# Patient Record
Sex: Female | Born: 1940
Health system: Southern US, Community
[De-identification: ages and names within clinical notes are randomized; demographics above are authoritative.]

## PROBLEM LIST (undated history)

## (undated) ENCOUNTER — Emergency Department (HOSPITAL_COMMUNITY)

## (undated) DIAGNOSIS — C50919 Malignant neoplasm of unspecified site of unspecified female breast: Secondary | ICD-10-CM

## (undated) DIAGNOSIS — D696 Thrombocytopenia, unspecified: Secondary | ICD-10-CM

## (undated) DIAGNOSIS — N183 Chronic kidney disease, stage 3 unspecified: Secondary | ICD-10-CM

## (undated) DIAGNOSIS — J449 Chronic obstructive pulmonary disease, unspecified: Secondary | ICD-10-CM

## (undated) DIAGNOSIS — E785 Hyperlipidemia, unspecified: Secondary | ICD-10-CM

## (undated) DIAGNOSIS — R079 Chest pain, unspecified: Secondary | ICD-10-CM

## (undated) DIAGNOSIS — N189 Chronic kidney disease, unspecified: Secondary | ICD-10-CM

## (undated) DIAGNOSIS — K7460 Unspecified cirrhosis of liver: Secondary | ICD-10-CM

## (undated) DIAGNOSIS — C801 Malignant (primary) neoplasm, unspecified: Secondary | ICD-10-CM

## (undated) DIAGNOSIS — R0601 Orthopnea: Secondary | ICD-10-CM

## (undated) DIAGNOSIS — I350 Nonrheumatic aortic (valve) stenosis: Secondary | ICD-10-CM

## (undated) DIAGNOSIS — R0602 Shortness of breath: Secondary | ICD-10-CM

## (undated) DIAGNOSIS — Z923 Personal history of irradiation: Secondary | ICD-10-CM

## (undated) DIAGNOSIS — K219 Gastro-esophageal reflux disease without esophagitis: Secondary | ICD-10-CM

## (undated) DIAGNOSIS — I10 Essential (primary) hypertension: Secondary | ICD-10-CM

## (undated) DIAGNOSIS — R600 Localized edema: Secondary | ICD-10-CM

## (undated) DIAGNOSIS — E559 Vitamin D deficiency, unspecified: Secondary | ICD-10-CM

## (undated) DIAGNOSIS — M1990 Unspecified osteoarthritis, unspecified site: Secondary | ICD-10-CM

## (undated) DIAGNOSIS — R002 Palpitations: Secondary | ICD-10-CM

## (undated) HISTORY — DX: Gastro-esophageal reflux disease without esophagitis: K21.9

## (undated) HISTORY — DX: Essential (primary) hypertension: I10

## (undated) HISTORY — PX: BREAST BIOPSY: SHX20

## (undated) HISTORY — DX: Chronic obstructive pulmonary disease, unspecified: J44.9

## (undated) HISTORY — DX: Unspecified cirrhosis of liver: K74.60

## (undated) HISTORY — DX: Nonrheumatic aortic (valve) stenosis: I35.0

## (undated) HISTORY — DX: Vitamin D deficiency, unspecified: E55.9

## (undated) HISTORY — PX: CORONARY ANGIOPLASTY: SHX604

## (undated) HISTORY — DX: Shortness of breath: R06.02

## (undated) HISTORY — DX: Unspecified osteoarthritis, unspecified site: M19.90

## (undated) HISTORY — DX: Orthopnea: R06.01

## (undated) HISTORY — DX: Chest pain, unspecified: R07.9

## (undated) HISTORY — DX: Palpitations: R00.2

## (undated) HISTORY — DX: Hyperlipidemia, unspecified: E78.5

## (undated) HISTORY — PX: BREAST LUMPECTOMY: SHX2

## (undated) HISTORY — DX: Malignant (primary) neoplasm, unspecified: C80.1

## (undated) HISTORY — DX: Chronic kidney disease, unspecified: N18.9

## (undated) HISTORY — PX: CARDIAC CATHETERIZATION: SHX172

## (undated) HISTORY — PX: LAPAROSCOPIC TOTAL HYSTERECTOMY: SUR800

## (undated) HISTORY — DX: Localized edema: R60.0

---

## 2000-10-31 ENCOUNTER — Other Ambulatory Visit: Admission: RE | Admit: 2000-10-31 | Discharge: 2000-10-31 | Payer: Self-pay | Admitting: Obstetrics and Gynecology

## 2002-03-12 ENCOUNTER — Encounter: Admission: RE | Admit: 2002-03-12 | Discharge: 2002-03-12 | Payer: Self-pay | Admitting: Family Medicine

## 2002-03-12 ENCOUNTER — Encounter: Payer: Self-pay | Admitting: Family Medicine

## 2004-06-18 ENCOUNTER — Encounter (INDEPENDENT_AMBULATORY_CARE_PROVIDER_SITE_OTHER): Payer: Self-pay | Admitting: *Deleted

## 2004-06-18 ENCOUNTER — Encounter (INDEPENDENT_AMBULATORY_CARE_PROVIDER_SITE_OTHER): Payer: Self-pay | Admitting: Specialist

## 2004-06-18 ENCOUNTER — Encounter (INDEPENDENT_AMBULATORY_CARE_PROVIDER_SITE_OTHER): Payer: Self-pay | Admitting: Radiology

## 2004-06-18 ENCOUNTER — Encounter: Admission: RE | Admit: 2004-06-18 | Discharge: 2004-06-18 | Payer: Self-pay | Admitting: Family Medicine

## 2004-06-29 ENCOUNTER — Encounter (HOSPITAL_COMMUNITY): Admission: RE | Admit: 2004-06-29 | Discharge: 2004-09-27 | Payer: Self-pay | Admitting: General Surgery

## 2004-07-06 ENCOUNTER — Encounter: Admission: RE | Admit: 2004-07-06 | Discharge: 2004-07-06 | Payer: Self-pay | Admitting: General Surgery

## 2004-07-07 ENCOUNTER — Encounter (INDEPENDENT_AMBULATORY_CARE_PROVIDER_SITE_OTHER): Payer: Self-pay | Admitting: General Surgery

## 2004-07-07 ENCOUNTER — Encounter: Admission: RE | Admit: 2004-07-07 | Discharge: 2004-07-07 | Payer: Self-pay | Admitting: General Surgery

## 2004-07-07 ENCOUNTER — Ambulatory Visit (HOSPITAL_BASED_OUTPATIENT_CLINIC_OR_DEPARTMENT_OTHER): Admission: RE | Admit: 2004-07-07 | Discharge: 2004-07-07 | Payer: Self-pay | Admitting: General Surgery

## 2004-07-07 ENCOUNTER — Encounter (INDEPENDENT_AMBULATORY_CARE_PROVIDER_SITE_OTHER): Payer: Self-pay | Admitting: *Deleted

## 2004-07-07 ENCOUNTER — Ambulatory Visit (HOSPITAL_COMMUNITY): Admission: RE | Admit: 2004-07-07 | Discharge: 2004-07-07 | Payer: Self-pay | Admitting: General Surgery

## 2004-07-08 ENCOUNTER — Ambulatory Visit: Payer: Self-pay | Admitting: Oncology

## 2004-07-23 ENCOUNTER — Ambulatory Visit (HOSPITAL_COMMUNITY): Admission: RE | Admit: 2004-07-23 | Discharge: 2004-07-23 | Payer: Self-pay | Admitting: Oncology

## 2004-07-24 ENCOUNTER — Encounter: Payer: Self-pay | Admitting: Cardiology

## 2004-07-24 ENCOUNTER — Ambulatory Visit: Admission: RE | Admit: 2004-07-24 | Discharge: 2004-07-24 | Payer: Self-pay | Admitting: Oncology

## 2004-08-05 ENCOUNTER — Ambulatory Visit (HOSPITAL_COMMUNITY): Admission: RE | Admit: 2004-08-05 | Discharge: 2004-08-05 | Payer: Self-pay | Admitting: Oncology

## 2004-08-13 ENCOUNTER — Ambulatory Visit (HOSPITAL_BASED_OUTPATIENT_CLINIC_OR_DEPARTMENT_OTHER): Admission: RE | Admit: 2004-08-13 | Discharge: 2004-08-13 | Payer: Self-pay | Admitting: General Surgery

## 2004-08-13 ENCOUNTER — Ambulatory Visit (HOSPITAL_COMMUNITY): Admission: RE | Admit: 2004-08-13 | Discharge: 2004-08-13 | Payer: Self-pay | Admitting: General Surgery

## 2004-08-28 ENCOUNTER — Ambulatory Visit: Payer: Self-pay | Admitting: Oncology

## 2004-10-15 ENCOUNTER — Ambulatory Visit: Payer: Self-pay | Admitting: Oncology

## 2004-11-20 ENCOUNTER — Emergency Department (HOSPITAL_COMMUNITY): Admission: EM | Admit: 2004-11-20 | Discharge: 2004-11-20 | Payer: Self-pay | Admitting: *Deleted

## 2004-12-03 ENCOUNTER — Ambulatory Visit: Payer: Self-pay | Admitting: Oncology

## 2005-01-12 ENCOUNTER — Ambulatory Visit: Admission: RE | Admit: 2005-01-12 | Discharge: 2005-03-25 | Payer: Self-pay | Admitting: Radiation Oncology

## 2005-01-15 ENCOUNTER — Encounter: Admission: RE | Admit: 2005-01-15 | Discharge: 2005-01-15 | Payer: Self-pay | Admitting: Radiation Oncology

## 2005-02-11 ENCOUNTER — Ambulatory Visit: Payer: Self-pay | Admitting: Oncology

## 2005-03-24 ENCOUNTER — Encounter: Admission: RE | Admit: 2005-03-24 | Discharge: 2005-03-24 | Payer: Self-pay | Admitting: Oncology

## 2005-04-05 ENCOUNTER — Ambulatory Visit (HOSPITAL_BASED_OUTPATIENT_CLINIC_OR_DEPARTMENT_OTHER): Admission: RE | Admit: 2005-04-05 | Discharge: 2005-04-05 | Payer: Self-pay | Admitting: General Surgery

## 2005-05-12 ENCOUNTER — Ambulatory Visit: Payer: Self-pay | Admitting: Oncology

## 2005-05-18 ENCOUNTER — Ambulatory Visit (HOSPITAL_COMMUNITY): Admission: RE | Admit: 2005-05-18 | Discharge: 2005-05-18 | Payer: Self-pay | Admitting: Oncology

## 2005-07-06 ENCOUNTER — Encounter: Admission: RE | Admit: 2005-07-06 | Discharge: 2005-07-06 | Payer: Self-pay | Admitting: Oncology

## 2005-07-30 ENCOUNTER — Ambulatory Visit: Payer: Self-pay | Admitting: Oncology

## 2005-08-13 ENCOUNTER — Ambulatory Visit (HOSPITAL_COMMUNITY): Admission: RE | Admit: 2005-08-13 | Discharge: 2005-08-13 | Payer: Self-pay | Admitting: Oncology

## 2005-10-07 ENCOUNTER — Ambulatory Visit: Payer: Self-pay | Admitting: Oncology

## 2005-11-19 ENCOUNTER — Ambulatory Visit (HOSPITAL_COMMUNITY): Admission: RE | Admit: 2005-11-19 | Discharge: 2005-11-19 | Payer: Self-pay | Admitting: Gastroenterology

## 2005-12-03 ENCOUNTER — Ambulatory Visit: Payer: Self-pay | Admitting: Oncology

## 2006-04-12 IMAGING — PT NM PET TUM IMG SKULL BASE T - THIGH
3 series · 25 of 25 positions shown · non-contrast
Comparison: None.

CLINICAL DATA: The patient has a history of breast cancer with lumpectomy on 07/07/04 with 2 positive axillary nodes. 
 FDG PET-CT TUMOR IMAGING (SKULL BASE TO THIGHS):
TECHNIQUE: 17.4 mCi F-18 FDG were administered via the right antecubital fossa.  Full-ring PET imaging was performed from the skull base through the mid-thighs 60 minutes after injection.  CT data was obtained and used for attenuation correction and anatomic localization only.  (This was not acquired as a diagnostic CT examination.)

[Series 1: pet ac · axial · 3.3mm · 4.69mm/px · z∈[-870,+0]mm · 9 of 267 slices shown]
[im 1/267]
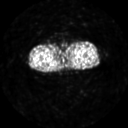
[im 34/267]
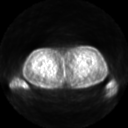
[im 67/267]
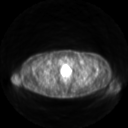
[im 100/267]
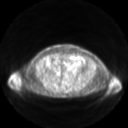
[im 134/267]
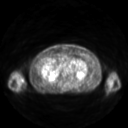
[im 167/267]
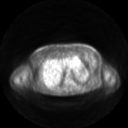
[im 200/267]
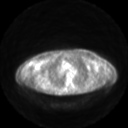
[im 233/267]
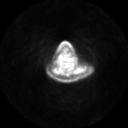
[im 267/267]
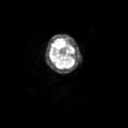

[Series 2: ct images · axial · 3.8mm · 0.98mm/px · z∈[-870,+0]mm · 8 of 267 slices shown]
[im 1/267]
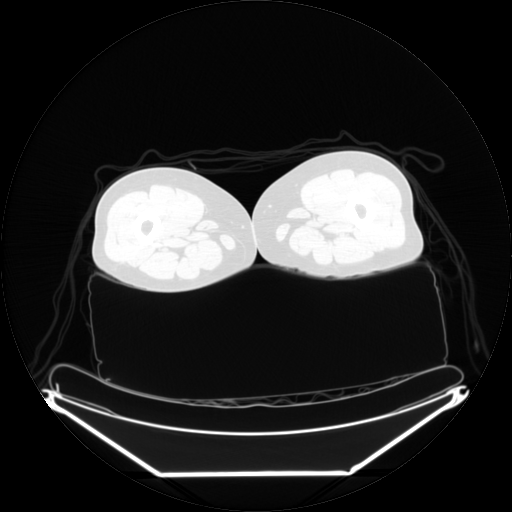
[im 39/267]
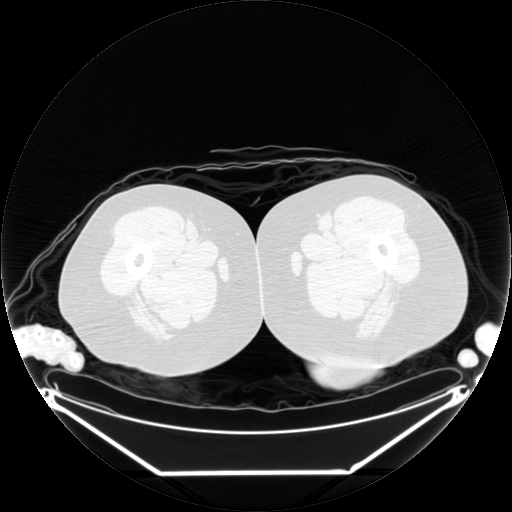
[im 77/267]
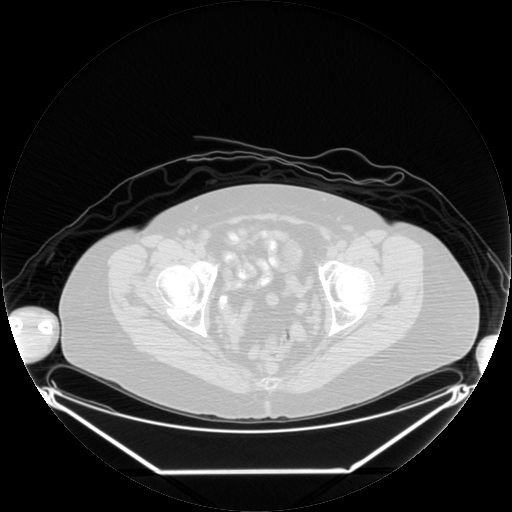
[im 115/267]
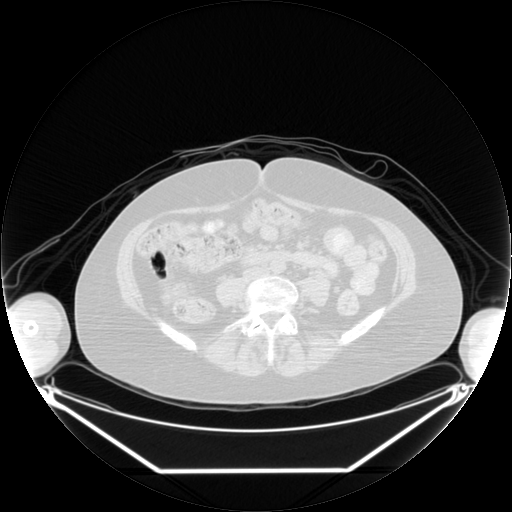
[im 153/267]
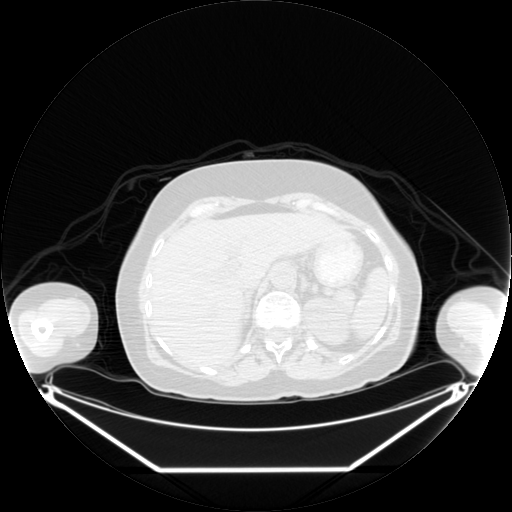
[im 191/267]
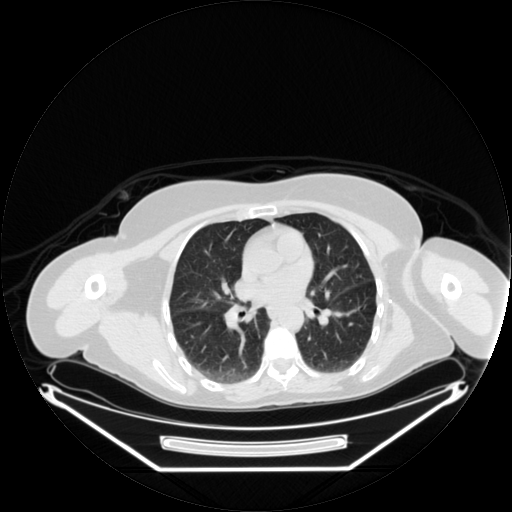
[im 229/267]
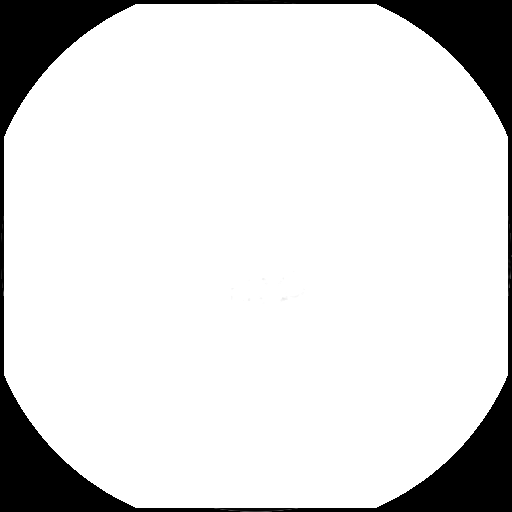
[im 267/267  brain]
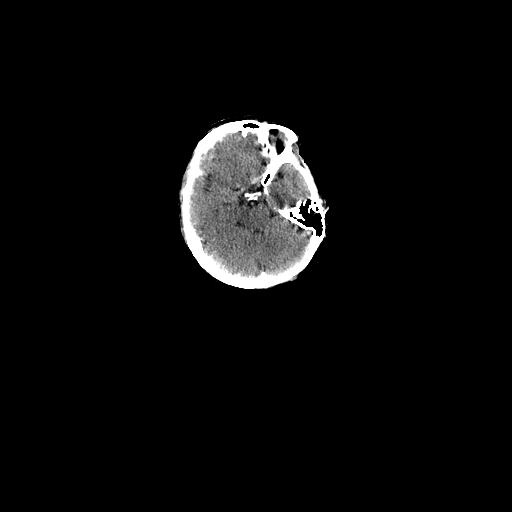

[Series 2: pet nac · axial · 3.3mm · 4.69mm/px · z∈[-870,+0]mm · 8 of 267 slices shown]
[im 1/267]
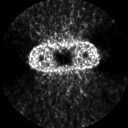
[im 39/267]
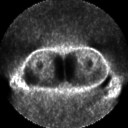
[im 77/267]
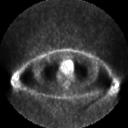
[im 115/267]
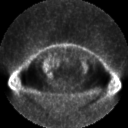
[im 153/267]
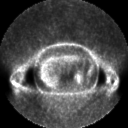
[im 191/267]
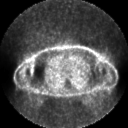
[im 229/267]
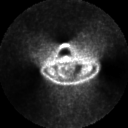
[im 267/267]
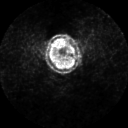

[25 of 25 positions shown; findings below may reference images not displayed]

FINDINGS: No abnormal activity is present in the neck or chest.  There is minimal increase in activity in the left breast in the area of the previous lumpectomy.  No axillary node activity is identified.  Metallic clips are noted in the region of the left axilla in the chest on the CT scan.
IMPRESSION: No areas of abnormal FDG activity.  Several areas of extensive calcification noted in the left lung are thought to represent granulomas.

## 2006-05-03 ENCOUNTER — Ambulatory Visit: Payer: Self-pay | Admitting: Oncology

## 2006-05-05 LAB — CBC WITH DIFFERENTIAL/PLATELET
BASO%: 0.5 % (ref 0.0–2.0)
Basophils Absolute: 0 10*3/uL (ref 0.0–0.1)
EOS%: 2.6 % (ref 0.0–7.0)
Eosinophils Absolute: 0.1 10*3/uL (ref 0.0–0.5)
HCT: 40 % (ref 34.8–46.6)
HGB: 13.9 g/dL (ref 11.6–15.9)
LYMPH%: 45.8 % (ref 14.0–48.0)
MCH: 33.5 pg (ref 26.0–34.0)
MCHC: 34.7 g/dL (ref 32.0–36.0)
MCV: 96.4 fL (ref 81.0–101.0)
MONO#: 0.6 10*3/uL (ref 0.1–0.9)
MONO%: 12.8 % (ref 0.0–13.0)
NEUT#: 1.9 10*3/uL (ref 1.5–6.5)
NEUT%: 38.3 % — ABNORMAL LOW (ref 39.6–76.8)
Platelets: 209 10*3/uL (ref 145–400)
RBC: 4.15 10*6/uL (ref 3.70–5.32)
RDW: 12.5 % (ref 11.3–14.5)
WBC: 5.1 10*3/uL (ref 3.9–10.0)
lymph#: 2.3 10*3/uL (ref 0.9–3.3)

## 2006-05-05 LAB — COMPREHENSIVE METABOLIC PANEL
ALT: 32 U/L (ref 0–40)
AST: 29 U/L (ref 0–37)
Albumin: 4.4 g/dL (ref 3.5–5.2)
Alkaline Phosphatase: 46 U/L (ref 39–117)
BUN: 22 mg/dL (ref 6–23)
CO2: 27 mEq/L (ref 19–32)
Calcium: 9.5 mg/dL (ref 8.4–10.5)
Chloride: 102 mEq/L (ref 96–112)
Creatinine, Ser: 0.74 mg/dL (ref 0.40–1.20)
Glucose, Bld: 116 mg/dL — ABNORMAL HIGH (ref 70–99)
Potassium: 4.1 mEq/L (ref 3.5–5.3)
Sodium: 140 mEq/L (ref 135–145)
Total Bilirubin: 0.5 mg/dL (ref 0.3–1.2)
Total Protein: 6.7 g/dL (ref 6.0–8.3)

## 2006-05-05 LAB — CANCER ANTIGEN 27.29: CA 27.29: 54 U/mL — ABNORMAL HIGH (ref 0–39)

## 2006-05-05 LAB — LACTATE DEHYDROGENASE: LDH: 167 U/L (ref 94–250)

## 2006-05-24 ENCOUNTER — Ambulatory Visit (HOSPITAL_COMMUNITY): Admission: RE | Admit: 2006-05-24 | Discharge: 2006-05-24 | Payer: Self-pay | Admitting: Oncology

## 2006-05-25 ENCOUNTER — Ambulatory Visit (HOSPITAL_COMMUNITY): Admission: RE | Admit: 2006-05-25 | Discharge: 2006-05-25 | Payer: Self-pay | Admitting: Oncology

## 2006-07-08 ENCOUNTER — Encounter: Admission: RE | Admit: 2006-07-08 | Discharge: 2006-07-08 | Payer: Self-pay | Admitting: Oncology

## 2006-11-02 ENCOUNTER — Ambulatory Visit: Payer: Self-pay | Admitting: Oncology

## 2006-11-07 LAB — COMPREHENSIVE METABOLIC PANEL
ALT: 23 U/L (ref 0–35)
AST: 22 U/L (ref 0–37)
Albumin: 4 g/dL (ref 3.5–5.2)
Alkaline Phosphatase: 59 U/L (ref 39–117)
BUN: 19 mg/dL (ref 6–23)
CO2: 22 mEq/L (ref 19–32)
Calcium: 8.9 mg/dL (ref 8.4–10.5)
Chloride: 107 mEq/L (ref 96–112)
Creatinine, Ser: 0.71 mg/dL (ref 0.40–1.20)
Glucose, Bld: 141 mg/dL — ABNORMAL HIGH (ref 70–99)
Potassium: 4 mEq/L (ref 3.5–5.3)
Sodium: 140 mEq/L (ref 135–145)
Total Bilirubin: 0.4 mg/dL (ref 0.3–1.2)
Total Protein: 6.3 g/dL (ref 6.0–8.3)

## 2006-11-07 LAB — CBC WITH DIFFERENTIAL/PLATELET
BASO%: 0.5 % (ref 0.0–2.0)
Basophils Absolute: 0 10*3/uL (ref 0.0–0.1)
EOS%: 1.9 % (ref 0.0–7.0)
Eosinophils Absolute: 0.1 10*3/uL (ref 0.0–0.5)
HCT: 38.3 % (ref 34.8–46.6)
HGB: 13.3 g/dL (ref 11.6–15.9)
LYMPH%: 47.8 % (ref 14.0–48.0)
MCH: 32.5 pg (ref 26.0–34.0)
MCHC: 34.8 g/dL (ref 32.0–36.0)
MCV: 93.4 fL (ref 81.0–101.0)
MONO#: 0.6 10*3/uL (ref 0.1–0.9)
MONO%: 10.5 % (ref 0.0–13.0)
NEUT#: 2.1 10*3/uL (ref 1.5–6.5)
NEUT%: 39.3 % — ABNORMAL LOW (ref 39.6–76.8)
Platelets: 212 10*3/uL (ref 145–400)
RBC: 4.1 10*6/uL (ref 3.70–5.32)
RDW: 12.4 % (ref 11.3–14.5)
WBC: 5.3 10*3/uL (ref 3.9–10.0)
lymph#: 2.5 10*3/uL (ref 0.9–3.3)

## 2006-11-07 LAB — CANCER ANTIGEN 27.29: CA 27.29: 50 U/mL — ABNORMAL HIGH (ref 0–39)

## 2006-11-07 LAB — LACTATE DEHYDROGENASE: LDH: 166 U/L (ref 94–250)

## 2006-11-18 ENCOUNTER — Encounter: Admission: RE | Admit: 2006-11-18 | Discharge: 2006-11-18 | Payer: Self-pay | Admitting: Cardiology

## 2006-12-07 ENCOUNTER — Encounter: Admission: RE | Admit: 2006-12-07 | Discharge: 2006-12-07 | Payer: Self-pay | Admitting: Family Medicine

## 2007-01-19 ENCOUNTER — Ambulatory Visit: Payer: Self-pay | Admitting: Internal Medicine

## 2007-01-19 LAB — CONVERTED CEMR LAB
BUN: 17 mg/dL (ref 6–23)
Basophils Absolute: 0 10*3/uL (ref 0.0–0.1)
Basophils Relative: 0.3 % (ref 0.0–1.0)
CO2: 27 meq/L (ref 19–32)
Calcium: 9.3 mg/dL (ref 8.4–10.5)
Chloride: 105 meq/L (ref 96–112)
Creatinine, Ser: 0.7 mg/dL (ref 0.4–1.2)
Eosinophils Absolute: 0.1 10*3/uL (ref 0.0–0.6)
Eosinophils Relative: 1.3 % (ref 0.0–5.0)
GFR calc Af Amer: 109 mL/min
GFR calc non Af Amer: 90 mL/min
Glucose, Bld: 105 mg/dL — ABNORMAL HIGH (ref 70–99)
HCT: 35.9 % — ABNORMAL LOW (ref 36.0–46.0)
Hemoglobin: 12.4 g/dL (ref 12.0–15.0)
Lymphocytes Relative: 35.2 % (ref 12.0–46.0)
MCHC: 34.6 g/dL (ref 30.0–36.0)
MCV: 93.9 fL (ref 78.0–100.0)
Monocytes Absolute: 0.7 10*3/uL (ref 0.2–0.7)
Monocytes Relative: 9.6 % (ref 3.0–11.0)
Neutro Abs: 3.9 10*3/uL (ref 1.4–7.7)
Neutrophils Relative %: 53.6 % (ref 43.0–77.0)
Platelets: 193 10*3/uL (ref 150–400)
Potassium: 4.5 meq/L (ref 3.5–5.1)
Pro B Natriuretic peptide (BNP): 26 pg/mL (ref 0.0–100.0)
RBC: 3.83 M/uL — ABNORMAL LOW (ref 3.87–5.11)
RDW: 12.1 % (ref 11.5–14.6)
Sed Rate: 11 mm/hr (ref 0–25)
Sodium: 138 meq/L (ref 135–145)
WBC: 7.3 10*3/uL (ref 4.5–10.5)

## 2007-02-13 ENCOUNTER — Ambulatory Visit: Payer: Self-pay | Admitting: Internal Medicine

## 2007-03-20 ENCOUNTER — Encounter: Admission: RE | Admit: 2007-03-20 | Discharge: 2007-03-20 | Payer: Self-pay | Admitting: Oncology

## 2007-03-21 ENCOUNTER — Ambulatory Visit: Admission: RE | Admit: 2007-03-21 | Discharge: 2007-03-21 | Payer: Self-pay | Admitting: Oncology

## 2007-03-21 ENCOUNTER — Ambulatory Visit: Payer: Self-pay | Admitting: Oncology

## 2007-03-21 ENCOUNTER — Ambulatory Visit: Payer: Self-pay | Admitting: *Deleted

## 2007-03-21 LAB — CBC WITH DIFFERENTIAL/PLATELET
BASO%: 0.3 % (ref 0.0–2.0)
Basophils Absolute: 0 10*3/uL (ref 0.0–0.1)
EOS%: 1.8 % (ref 0.0–7.0)
Eosinophils Absolute: 0.1 10*3/uL (ref 0.0–0.5)
HCT: 38.1 % (ref 34.8–46.6)
HGB: 13.4 g/dL (ref 11.6–15.9)
LYMPH%: 38 % (ref 14.0–48.0)
MCH: 33.6 pg (ref 26.0–34.0)
MCHC: 35.1 g/dL (ref 32.0–36.0)
MCV: 95.9 fL (ref 81.0–101.0)
MONO#: 0.6 10*3/uL (ref 0.1–0.9)
MONO%: 11.3 % (ref 0.0–13.0)
NEUT#: 2.6 10*3/uL (ref 1.5–6.5)
NEUT%: 48.6 % (ref 39.6–76.8)
Platelets: 231 10*3/uL (ref 145–400)
RBC: 3.98 10*6/uL (ref 3.70–5.32)
RDW: 13 % (ref 11.3–14.5)
WBC: 5.4 10*3/uL (ref 3.9–10.0)
lymph#: 2 10*3/uL (ref 0.9–3.3)

## 2007-03-21 LAB — COMPREHENSIVE METABOLIC PANEL
ALT: 63 U/L — ABNORMAL HIGH (ref 0–35)
AST: 37 U/L (ref 0–37)
Albumin: 4 g/dL (ref 3.5–5.2)
Alkaline Phosphatase: 76 U/L (ref 39–117)
BUN: 19 mg/dL (ref 6–23)
CO2: 22 mEq/L (ref 19–32)
Calcium: 9.3 mg/dL (ref 8.4–10.5)
Chloride: 104 mEq/L (ref 96–112)
Creatinine, Ser: 0.76 mg/dL (ref 0.40–1.20)
Glucose, Bld: 180 mg/dL — ABNORMAL HIGH (ref 70–99)
Potassium: 4.2 mEq/L (ref 3.5–5.3)
Sodium: 137 mEq/L (ref 135–145)
Total Bilirubin: 0.7 mg/dL (ref 0.3–1.2)
Total Protein: 6.5 g/dL (ref 6.0–8.3)

## 2007-03-21 LAB — CANCER ANTIGEN 27.29: CA 27.29: 46 U/mL — ABNORMAL HIGH (ref 0–39)

## 2007-03-21 LAB — LACTATE DEHYDROGENASE: LDH: 144 U/L (ref 94–250)

## 2007-03-28 ENCOUNTER — Ambulatory Visit (HOSPITAL_COMMUNITY): Admission: RE | Admit: 2007-03-28 | Discharge: 2007-03-28 | Payer: Self-pay | Admitting: Oncology

## 2007-04-04 LAB — VITAMIN D PNL(25-HYDRXY+1,25-DIHY)-BLD
Vit D, 1,25-Dihydroxy: 73 pg/mL — ABNORMAL HIGH (ref 6–62)
Vit D, 25-Hydroxy: 26 ng/mL (ref 20–57)

## 2007-04-17 ENCOUNTER — Ambulatory Visit: Payer: Self-pay | Admitting: Internal Medicine

## 2007-05-11 ENCOUNTER — Ambulatory Visit: Payer: Self-pay | Admitting: Oncology

## 2007-05-15 LAB — COMPREHENSIVE METABOLIC PANEL
ALT: 26 U/L (ref 0–35)
AST: 18 U/L (ref 0–37)
Albumin: 4.2 g/dL (ref 3.5–5.2)
Alkaline Phosphatase: 47 U/L (ref 39–117)
BUN: 18 mg/dL (ref 6–23)
CO2: 24 mEq/L (ref 19–32)
Calcium: 9.6 mg/dL (ref 8.4–10.5)
Chloride: 107 mEq/L (ref 96–112)
Creatinine, Ser: 0.69 mg/dL (ref 0.40–1.20)
Glucose, Bld: 93 mg/dL (ref 70–99)
Potassium: 4.2 mEq/L (ref 3.5–5.3)
Sodium: 140 mEq/L (ref 135–145)
Total Bilirubin: 0.6 mg/dL (ref 0.3–1.2)
Total Protein: 6.6 g/dL (ref 6.0–8.3)

## 2007-05-15 LAB — CBC WITH DIFFERENTIAL/PLATELET
BASO%: 0.5 % (ref 0.0–2.0)
Basophils Absolute: 0 10*3/uL (ref 0.0–0.1)
EOS%: 2.1 % (ref 0.0–7.0)
Eosinophils Absolute: 0.2 10*3/uL (ref 0.0–0.5)
HCT: 37.1 % (ref 34.8–46.6)
HGB: 13.4 g/dL (ref 11.6–15.9)
LYMPH%: 45.8 % (ref 14.0–48.0)
MCH: 34.3 pg — ABNORMAL HIGH (ref 26.0–34.0)
MCHC: 36.1 g/dL — ABNORMAL HIGH (ref 32.0–36.0)
MCV: 95.1 fL (ref 81.0–101.0)
MONO#: 0.7 10*3/uL (ref 0.1–0.9)
MONO%: 9.2 % (ref 0.0–13.0)
NEUT#: 3.2 10*3/uL (ref 1.5–6.5)
NEUT%: 42.4 % (ref 39.6–76.8)
Platelets: 211 10*3/uL (ref 145–400)
RBC: 3.91 10*6/uL (ref 3.70–5.32)
RDW: 12.2 % (ref 11.3–14.5)
WBC: 7.5 10*3/uL (ref 3.9–10.0)
lymph#: 3.4 10*3/uL — ABNORMAL HIGH (ref 0.9–3.3)

## 2007-05-15 LAB — CANCER ANTIGEN 27.29: CA 27.29: 45 U/mL — ABNORMAL HIGH (ref 0–39)

## 2007-05-15 LAB — LACTATE DEHYDROGENASE: LDH: 183 U/L (ref 94–250)

## 2007-05-19 ENCOUNTER — Ambulatory Visit (HOSPITAL_COMMUNITY): Admission: RE | Admit: 2007-05-19 | Discharge: 2007-05-19 | Payer: Self-pay | Admitting: Oncology

## 2007-07-17 ENCOUNTER — Encounter: Admission: RE | Admit: 2007-07-17 | Discharge: 2007-07-17 | Payer: Self-pay | Admitting: Oncology

## 2007-08-25 ENCOUNTER — Ambulatory Visit (HOSPITAL_COMMUNITY): Admission: RE | Admit: 2007-08-25 | Discharge: 2007-08-25 | Payer: Self-pay | Admitting: Cardiology

## 2007-09-04 ENCOUNTER — Encounter: Admission: RE | Admit: 2007-09-04 | Discharge: 2007-09-04 | Payer: Self-pay | Admitting: Family Medicine

## 2011-01-19 NOTE — Cardiovascular Report (Signed)
[name] [name], [name] [name]              ACCOUNT NO.:  [id number]   MEDICAL RECORD NO.:  [id number]          PATIENT TYPE:  OIB   LOCATION:  2899                         FACILITY:  [facility]   PHYSICIAN:  [name] [name], M.D.     DATE OF BIRTH:  03-Sep-1941   DATE OF PROCEDURE:  08/25/2007  DATE OF DISCHARGE:  08/25/2007                            CARDIAC CATHETERIZATION   REFERRING PHYSICIAN:  Dr. Sigmund Hazel.   PROCEDURE:  Left heart catheterization, right heart catheterization,  coronary angiography, left ventriculography.   OPERATOR:  [name] Magic, MD.   INDICATIONS:  Shortness of breath.   COMPLICATIONS:  None.   IV ACCESS:  Right femoral artery 6-French sheath and right femoral vein  7-French sheath.   IV MEDICATIONS:  Versed 1 mg IV, fentanyl 25 mcg IV.   This is a 70 year old female who presents with shortness of breath of  unclear etiology and now presents for cardiac catheterization.  The  patient is brought to the cardiac catheterization laboratory in a  fasting nonsedated state.  Informed consent was obtained.  The patient  was connected to continuous heart rate and pulse oximetry monitoring and  intermittent blood pressure monitoring.  The right groin was prepped and  draped in a sterile fashion.  1% Xylocaine was used for local  anesthesia.  Using the modified Seldinger technique, a 6-French sheath  was placed in the right femoral artery.  Using modified Seldinger  technique, a 7-French sheath was placed in right femoral vein.  Under  fluoroscopic guidance a 7-French Swan-Ganz catheter was placed into the  right atrium under balloon flotation.  Right atrial pressure was  measured and right atrial O2 saturations were obtained.  The catheter  was advanced in the right ventricle and right ventricular pressure and  right ventricular O2 saturations were obtained.  The catheter was  advanced in the pulmonary artery out into the pulmonary capillary wedge  pressure, positioned  where the pulmonary capillary wedge was measured.  The balloon was then deflated and pulled back into the pulmonary artery.  Pulmonary artery O2 saturations were obtained.  Then the catheter was  removed under fluoroscopic guidance.   A 6-French JL-4 catheter was placed in the left coronary artery.  Multiple stain films were taken at 30 degree RAO and 40 degree LAO  views.  This catheter was then exchanged over a guidewire for 6-French  JR-4 catheter which was placed under fluoroscopic guidance in the right  coronary artery.  Multiple stain films were taken at 30 degree RAO, 40  degree LAO views.  This catheter was then exchanged out over a guidewire  for a 6-French angled pigtail catheter which was placed under  fluoroscopic guidance in the left ventricular cavity.  Left  ventriculography was performed in the 30 degree RAO view using a total  of 30 mL of contrast at 15 mL percent.  The catheter was then pulled  back across the aortic valve with no significant gradient noted.  At the  end of the procedure, all catheters and sheaths were removed.  Manual  compression was performed until adequate hemostasis was obtained.  The  patient was transferred back to her room in stable condition.   RESULTS:  1. The left main coronary is widely patent and bifurcates in the left      anterior descending artery and left circumflex artery.  2. The left anterior descending artery is widely patent throughout its      course of the apex.  It gives rise to a first diagonal which is      rather large and bifurcates into two daughter vessels both of which      are widely patent.  The ongoing LAD gives rise to a second diagonal      which is widely patent.  3. The left circumflex is widely patent throughout its course in the      AV groove.  It gives rise to a first small obtuse marginal one      branch which is widely patent and then gives rise to a second      larger obtuse marginal branch too which  bifurcates into two      daughter vessels and is widely patent.  4. The right coronary is widely patent throughout its course and      distally bifurcates into the posterior descending artery and      posterior lateral artery both of which are widely patent.  5. Left ventriculography shows normal LV function, EF 60%, LVEDP 10-16      mmHg.  LV pressure 148/70 mmHg, aortic pressure was 155/81 mmHg.  6. Right heart cath data showed oxygen saturations in the right atrium      67%, RV 66%, PA 66%.  Cardiac output by Fick 3.8, cardiac index by      Fick 2.0, right atrial pressure 12/9 with a mean of 8 mmHg, right      ventricular pressure 35/80 with a mean of 13 mmHg.  PA pressure      36/17 with a mean of 27 mmHg.  Pulmonary capillary wedge pressure      23/23 with a mean of 17 mmHg.   ASSESSMENT:  1. Normal coronary arteries.  2. Normal LV function.  3. Normal right heart cath.  4. Noncardiac shortness of breath most likely secondary to obesity and      deconditioning, and also possibly from a tracheal deviation      secondary to a thyroid mass that was noted on her chest x-ray pre-      cath.   PLAN:  1. Discharge to home after IV fluid and bedrest.  2. Followup with nurse practitioner in two weeks for groin check.  3. Followup with Dr. Clarene Duke in regards to the abnormal chest x-ray      showing tracheal deviation secondary to possible thyroid mass.  I      have discussed this personally with Dr. Clarene Duke who will schedule      followup with the patient.      [name] [name], M.D.  Electronically Signed     TT/MEDQ  D:  09/05/2007  T:  09/05/2007  Job:  [id number]   cc:   Thereasa Solo. Little, M.D.  Sigmund Hazel, M.D.

## 2011-01-19 NOTE — Assessment & Plan Note (Signed)
[facility]                             PULMONARY OFFICE NOTE   NAME:[name] [name], [name] [name]                       MRN:          [id number]  DATE:01/19/2007                            DOB:          [birth year]    REFERRING PHYSICIAN:  Sigmund Hazel, M.D.   CHIEF COMPLAINT:  Dyspnea.   HISTORY:  This is a 70 year old white female who was first sick in  January 2008 with mostly head and chest congestion with worsening  dyspnea associated with yellow sputum production and received several  rounds of antibiotics. Each time she receives antibiotics she is better  and then worse again. She is presently worse again for the last 48  hours with increasing cough and congestion with yellow sputum. She  denies, however, any pleuritic pain, fever, myalgias, arthralgias,  difficulty with dysphagia or risk factors for aspiration. Denies any  rigors or pleuritic pain.   PAST MEDICAL HISTORY:  1. Breast cancer status post 2/11 positive nodes in 2005, requiring      chemotherapy and radiation, completed in 2006.  2. She denies any history of significant allergies or sinus problems.  3. She is status post hysterectomy.  4. Status post tubal ligation.   ALLERGIES:  None known.   CURRENT MEDICATIONS:  Taken in detail on the worksheet, correct in the  column dated December 20, 2006, significant for the fact that she is taking  Fosamax and fish oil.   SOCIAL HISTORY:  She quit smoking two years ago. She weighed 170 pounds  at that point. 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   FAMILY HISTORY:  Positive for asthma in her mother. Negative for atopy.   REVIEW OF SYSTEMS:  Taken in detail on the worksheet. Negative, except  as outlined above.   CT scan of the chest was reviewed from April 2, indicating patchy  alveolar disease in the right upper lobe and both lower lobes.   PHYSICAL EXAMINATION:  This is a pleasant, ambulatory, white female with  a nasal tone to her  voice and obvious sound of nasal congestion with  breathing. She is afebrile with stable vital signs.  HEENT: Reveals moderate to severe turbinate edema bilaterally.  Oropharynx is clear.  NECK: Supple without cervical adenopathy or tenderness. Trachea is  midline without lymphadenopathy.  LUNGS: Lung fields are perfectly clear bilaterally to auscultation and  percussion.  There is excellent air movement and no cough on inspiratory  or expiratory maneuvers.  HEART: Regular rate and rhythm without murmur, gallop or rub or  tachycardia.  ABDOMEN: Soft, benign.  EXTREMITIES: Warm without calf tenderness, cyanosis, clubbing or edema.   IMPRESSION:  Is recurrent cough associated with purulent sputum and  persistent nasal tone to the voice suggesting strongly to me the  diagnosis of chronic sinusitis with post-nasal drip syndrome. However,  note that she has had both chemotherapy and radiation therapy and has  abnormal chest CT scan as noted above. I have recommended therefore the  following:  1. Because she has purulent sputum, take Avelox for five days, but  only for five days.  2. Line up a sinus CT scan if not improved back to baseline after the      round of Avelox.  3. Treat chronic cough with omeprazole 20 mg before breakfast and      supper (not because I think she has primary reflux, but because she      has been coughing for so long she must at least have secondary      reflux).  4. For the same reason, I would like her to stop all Fosamax and oil-      based products (including her present fish oil based supplement)      until the cough is resolved.  5. To treat the cough, recommend Mucinex DM 1-2 every 12 hours. For      severe cough, add Mepergan 1 q4 because she is having controlled      cyclical coughing.   The differential diagnosis does include bronchiolitis obliterans  organized pneumonia, although this does not usually cause purulent  sputum. Also includes  radiation and/or  chemotherapy induced injury to  lung and atypical mycobacterial infection (which might have been  antibiotic responsive).   I therefore recommended a BNP and CBC, and sed rate today. Followup in  two weeks with the sinus CT scan to be obtained in the meantime if  necessary. Followup chest x-ray is pending today.     Charlaine Dalton. Sherene Sires, MD, [facility]     MBW/MedQ  DD: 01/19/2007  DT: 01/19/2007  Job #: [id number]   cc:   Sigmund Hazel, M.D.

## 2011-01-19 NOTE — Assessment & Plan Note (Signed)
[facility]                             PULMONARY OFFICE NOTE   NAME:[name], [name]                       MRN:          [id number]  DATE:04/17/2007                            DOB:          1941-07-18    PULMONARY EXTENDED FOLLOWUP OFFICE VISIT   HISTORY:  A 70 year old white female with persistent cough since January  of 2008, seen on May 15, with diagnosis of rhinitis/sinusitis, confirmed  with a CT scan of the sinuses on February 13, 2007. She was given a ten day  course of Augmentin and asked to make an appointment for two weeks after  this, but never returned for followup. She mostly complains now of  hacking cough with symptoms of sore throat over the last 2-3 weeks which  is worse on getting up in the morning, but actually not producing excess  mucus. She is short of breath with activity, more so than usual since  the onset of her cough which has returned. However, she denies any  fevers, chills, sweats or purulent sputum, orthopnea, PND or leg  swelling.   MEDICATIONS:  Were reviewed in the column dated April 17, 2007, noting  that she is not taking any of the medications that previously were  recommended and is now on Fosamax which started on August 3.   PHYSICAL EXAMINATION:  She is a pleasant, ambulatory white female in no  acute distress. She has stable vital signs.  HEENT: Is unremarkable. Oropharynx is clear.  There is no evidence of  excessive post-nasal drainage or cobblestoning.  NECK: Supple without cervical adenopathy or tenderness. Trachea is  midline without organomegaly.  LUNGS: Lung fields are perfectly clear bilaterally to auscultation and  percussion with no cough elicited on inspiratory or expiratory  maneuvers.  HEART: Regular rate and rhythm without murmur, gallop or rub.  ABDOMEN: Soft, benign.  EXTREMITIES: Warm without calf tenderness, cyanosis, clubbing or edema.   IMPRESSION:  1. Recurrent cough in a patient with  documented sinusitis as recently      as February 13, 2007. I have recommended a ten day course of Augmentin      and spent extra time educating her on the optimal treatment of      rhinitis/sinusitis in both text and graphic formatted material. I      have started her on Nasacort AQ two puffs b.i.d. to taper down to      one puff q nightly with a recommendation for ENT followup if not      100% back to baseline.  2. Since cough begets cough and cough begets reflux, I have      recommended not using Fosamax until the cough has cleared up, then      restarting it once weekly and if coughing use omeprazole on a      b.i.d. dose taken 30 minutes before meals along with dietary      restrictions that I reviewed with her.  If coughing recurs on      fosfamax, consider alternative rx.  3. Finally, I noticed the workup included a CT scan  on April 2,      indicating air space disease in the right upper lobe and both      lower lobes. Clearly, this is not the case clinically, but a      followup chest x-ray has been ordered for her for tumor      surveillance in September and I would like to see her back if this      x-ray is abnormal or if there is any evidence of      macroscopic abnormality by chest x-ray. Otherwise, pulmonary      followup can be p.r.n. with ENT evaluation as the next step if she      fails to resolve on the above regimen.     [name] [name]. [name] Sires, MD, [facility]  Electronically Signed    MBW/MedQ  DD: 04/17/2007  DT: 04/18/2007  Job #: [id number]   cc:   [name] [name], M.D.

## 2011-01-22 NOTE — Op Note (Signed)
NAMEMARGUARITE, [name]              ACCOUNT NO.:  [id number]   MEDICAL RECORD NO.:  [id number]          PATIENT TYPE:  AMB   LOCATION:  DSC                          FACILITY:  [facility]   PHYSICIAN:  Rose Phi. Maple Hudson, M.D.   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   OPERATION:  1.  Left partial mastectomy with needle localization and specimen mammogram.  2.  Left axillary lymph node dissection.   SURGEON:  Rose Phi. Maple Hudson, M.D.   ANESTHESIA:  General.   OPERATIVE PROCEDURE:  This patient had presented with a palpable mass in the  left breast which was positive on core biopsy for breast cancer and on her  mammogram, there is an area in the lower outer quadrant of her left breast  which was also positive on biopsy that was nonpalpable.   Prior to coming to the operating room, a localizing wire had been placed in  the left breast.   After suitable general anesthesia was induced, the patient was placed in the  supine position with the arms extended on the arm board.  The left breast  and axilla were prepped and draped in the usual fashion.  A curved incision  in the inferior portion of her left breast in the outer quadrant was then  outlined used the previously placed wire as a guide.  The incision was then  made and then a wide excision of the wire and surrounding tissue was carried  out.  Specimen was oriented for the pathologist and submitted for a specimen  mammogram to be followed by margin evaluation.   While that was being done, a transverse left axillary incision was made with  dissection through the subcutaneous tissue and exposing the clavipectoral  fascia.  I then incised along the pectoralis major muscle and retracted it  and exposed the pectoralis minor  and dissected along it and then identified  and preserved the medial pectoral nerve.  We continued the dissection in  cephalad direction and exposed the axillary vein and then dissected out all  of the tissue inferior to the axillary vein and behind the pectoralis minor  muscle.  We exposed and preserved the long thoracic and thoracodorsal  nerves.  Other cutaneous nerves and vessels were clipped and divided.  Following the removal of the entire axillary contents, representing level 1  and 2 lymph node dissection, there was good hemostasis.  We thoroughly  irrigated the field with saline.  A 19 French drain was inserted (a Blake  drain was inserted) and brought through a separate stab wound.  I then  closed the incision with 3-0 Vicryl and subcuticular 4-0 Monocryl and Steri-  Strips.   At that time, the pathologist reported to [place] that the margins were clean  after having heard that the area of concern was in the specimen mammogram.   The partial mastectomy  incision was then closed in two layers with 3-0  Vicryl and subcuticular 4-0 Monocryl and Steri-Strips also.   Both incisions had been injected with 0.25% Marcaine.   Dressings were then applied, the patient transferred to the recovery room in  satisfactory condition, having tolerated the procedure well.      Pete   PRY/MEDQ  D:  07/07/2004  T:  07/07/2004  Job:  [id number]

## 2011-01-22 NOTE — Op Note (Signed)
NAMETERRIONNA, [name] [name]              ACCOUNT NO.:  [id number]   MEDICAL RECORD NO.:  [id number]          PATIENT TYPE:  AMB   LOCATION:  ENDO                         FACILITY:  [facility]   PHYSICIAN:  Bernette Redbird, M.D.   DATE OF BIRTH:  1941/03/05   DATE OF PROCEDURE:  11/19/2005  DATE OF DISCHARGE:  11/19/2005                                 OPERATIVE REPORT   PROCEDURE:  Colonoscopy.   INDICATION:  Initial screening exam in a 70 year old female.  She has no  worrisome risk factors or symptoms.   FINDINGS:  Moderate sigmoid fixation, but normal exam otherwise to the  cecum.   DESCRIPTION OF PROCEDURE:  The nature, purpose, and risks of the procedure  had been reviewed with the patient through our open access program and I  reviewed the purpose and risks with the patient immediately prior to the  procedure. She had provided written consent. Sedation was Phenergan 12.5 mg  IV, fentanyl 100 mcg IV, and Versed 9 mg IV prior to and during the course  of the procedure without arrhythmias or desaturation. The Olympus pediatric  adjustable tension video colonoscope was advanced with moderate difficulty  through a rather fixated and angulated sigmoid region, but with the patient  in the supine position application of some external abdominal compression,  irrigation with water to help open up the lumen, and so forth, we were able  to make progress all the around the colon to the cecum as identified by  clear visualization of the appendiceal orifice. Pullback was then performed.  The quality of the prep was excellent and it was felt that all areas were  well seen.   This was a normal examination. No polyps, cancer, colitis, vascular  malformations or diverticulosis were noted and retroflexion in the rectum  was unremarkable. No biopsies were obtained. The patient tolerated the  procedure well and there no apparent complications.   IMPRESSION:  Normal initial screening colonoscopy examine in  a patient  without worrisome risk factors or symptoms (V 76.51).   PLAN:  Consider repeat colonoscopy for ongoing screening in 10 years.           ______________________________  Bernette Redbird, M.D.     RB/MEDQ  D:  11/19/2005  T:  11/21/2005  Job:  [id number]   cc:   Caryn Bee L. Little, M.D.  Fax: [phone]

## 2011-01-22 NOTE — Op Note (Signed)
[name], [name] [name]              ACCOUNT NO.:  [id number]   MEDICAL RECORD NO.:  [id number]          PATIENT TYPE:  AMB   LOCATION:  DSC                          FACILITY:  [facility]   PHYSICIAN:  Rose Phi. Maple Hudson, M.D.   DATE OF BIRTH:  12/10/1940   DATE OF PROCEDURE:  04/05/2005  DATE OF DISCHARGE:                                 OPERATIVE REPORT   PREOPERATIVE DIAGNOSIS:  History of breast cancer.   POSTOPERATIVE DIAGNOSIS:  History of breast cancer.   OPERATION:  Removal of Port-A-Cath.   SURGEON:  Francina Ames, M.D.   ANESTHESIA:  Local.   OPERATIVE PROCEDURE:  The patient was placed on the operating table and the  right upper chest was prepped and draped in the usual fashion.  The old  incision was then anesthetized with 1% Xylocaine with adrenaline.  Incision  was made and I exposed the port and its catheter.  With traction on the  catheter, we removed it from the subclavian vein and there was no bleeding.   We then divided the 2 sutures, holding the port in place and the whole  apparatus then easily slipped out.   Again, there was no bleeding and a subcuticular closure of 4-0 Monocryl and  Steri-Strips was carried out.   A dressing was applied and the patient then allowed to go home.       PRY/MEDQ  D:  04/05/2005  T:  04/06/2005  Job:  [id number]

## 2011-01-22 NOTE — Op Note (Signed)
NAMEALISIA, VANENGEN              ACCOUNT NO.:  [id number]   MEDICAL RECORD NO.:  [id number]          PATIENT TYPE:  AMB   LOCATION:  DSC                          FACILITY:  [facility]   PHYSICIAN:  Rose Phi. Maple Hudson, M.D.   DATE OF BIRTH:  [birth year]   DATE OF PROCEDURE:  08/13/2004  DATE OF DISCHARGE:                                 OPERATIVE REPORT   PREOPERATIVE DIAGNOSIS:  Stage 2 carcinoma of the left breast.   POSTOPERATIVE DIAGNOSIS:  Stage 2 carcinoma of the left breast.   OPERATION PERFORMED:  Insertion of Port-A-Cath.   SURGEON:  Rose Phi. Maple Hudson, M.D.   ANESTHESIA:  MAC.   DESCRIPTION OF PROCEDURE:  The patient was placed on the operating table and  the right upper chest and neck prepped and draped in the usual fashion.  Under local anesthesia, a right subclavian puncture was carried out without  difficulty and the guidewire inserted and proper positioning confirmed by C-  arm fluoroscopy.  We then made an incision on the anterior chest wall and  developed a pocket for the implantable port.  I tunneled between the  puncture site in the new pocket and passed the catheter through that and  then connected it to the Bard X-Port which we then placed in the bed of the  pocket.  The catheter tip was then trimmed to go to the fourth interspace  which was at the level of the cavo-atrial junction.   The dilator and peel-away sheath were then passed over the wire and the wire  removed followed the dilator and the catheter passed through the peel-away  sheath was then removed.  Again, C-arm fluoroscopy was used to confirm that  there was no kinking in the system and that the catheter tip was at the cavo-  atrial junction in the superior vena cava.   We then anchored the port in the pocket with two 2-0 Prolene sutures.  The  incisions were closed with 3-0 Vicryl and subcuticular 4-0 Monocryl and  Steri-Strips.  We then accessed the system with the right angle Huber point  needle with  attached catheter and fully heparinized it.  Dressings were then  applied and the patient transferred to the recovery room in satisfactory  condition having tolerated the procedure well.      Pete   PRY/MEDQ  D:  08/13/2004  T:  08/13/2004  Job:  [id number]

## 2011-06-11 LAB — POCT I-STAT 3, VENOUS BLOOD GAS (G3P V)
Acid-base deficit: 1
Acid-base deficit: 1
Bicarbonate: 23.3
Bicarbonate: 23.8
Bicarbonate: 24.3 — ABNORMAL HIGH
O2 Saturation: 66
O2 Saturation: 66
O2 Saturation: 67
Operator id: 118461
Operator id: 118461
Operator id: 118461
TCO2: 24
TCO2: 25
TCO2: 25
pCO2, Ven: 38.4 — ABNORMAL LOW
pCO2, Ven: 38.9 — ABNORMAL LOW
pCO2, Ven: 39 — ABNORMAL LOW
pH, Ven: 7.391 — ABNORMAL HIGH
pH, Ven: 7.395 — ABNORMAL HIGH
pH, Ven: 7.403 — ABNORMAL HIGH
pO2, Ven: 34
pO2, Ven: 35
pO2, Ven: 35

## 2011-06-11 LAB — POCT I-STAT 3, ART BLOOD GAS (G3+)
Acid-base deficit: 1
Bicarbonate: 23.4
O2 Saturation: 93
Operator id: 118461
TCO2: 24
pCO2 arterial: 35.9
pH, Arterial: 7.422 — ABNORMAL HIGH
pO2, Arterial: 66 — ABNORMAL LOW

## 2017-12-08 ENCOUNTER — Encounter: Payer: Self-pay | Admitting: *Deleted

## 2017-12-08 ENCOUNTER — Telehealth: Payer: Self-pay | Admitting: Oncology

## 2017-12-08 NOTE — Progress Notes (Signed)
On 12-08-17 fax medical records to [facility] onc[facility] center , it was consult note, end of tx note. Sim & planning note, follow up note

## 2017-12-08 NOTE — Telephone Encounter (Signed)
Mailed pt records to [facility] in [place]

## 2018-11-08 ENCOUNTER — Other Ambulatory Visit: Payer: Self-pay | Admitting: Internal Medicine

## 2018-11-08 DIAGNOSIS — Z1231 Encounter for screening mammogram for malignant neoplasm of breast: Secondary | ICD-10-CM

## 2019-02-13 ENCOUNTER — Encounter: Payer: Self-pay | Admitting: Cardiology

## 2019-02-13 ENCOUNTER — Other Ambulatory Visit: Payer: Self-pay

## 2019-02-13 ENCOUNTER — Ambulatory Visit: Payer: Medicare Other | Admitting: Cardiology

## 2019-02-13 VITALS — BP 153/71 | HR 73 | Temp 97.7°F | Ht 62.0 in | Wt 207.0 lb

## 2019-02-13 DIAGNOSIS — I5031 Acute diastolic (congestive) heart failure: Secondary | ICD-10-CM

## 2019-02-13 DIAGNOSIS — R079 Chest pain, unspecified: Secondary | ICD-10-CM | POA: Insufficient documentation

## 2019-02-13 DIAGNOSIS — R0602 Shortness of breath: Secondary | ICD-10-CM | POA: Diagnosis not present

## 2019-02-13 DIAGNOSIS — K219 Gastro-esophageal reflux disease without esophagitis: Secondary | ICD-10-CM | POA: Insufficient documentation

## 2019-02-13 DIAGNOSIS — R9431 Abnormal electrocardiogram [ECG] [EKG]: Secondary | ICD-10-CM

## 2019-02-13 DIAGNOSIS — K7460 Unspecified cirrhosis of liver: Secondary | ICD-10-CM | POA: Insufficient documentation

## 2019-02-13 DIAGNOSIS — M1990 Unspecified osteoarthritis, unspecified site: Secondary | ICD-10-CM | POA: Insufficient documentation

## 2019-02-13 DIAGNOSIS — R002 Palpitations: Secondary | ICD-10-CM | POA: Insufficient documentation

## 2019-02-13 DIAGNOSIS — R0989 Other specified symptoms and signs involving the circulatory and respiratory systems: Secondary | ICD-10-CM

## 2019-02-13 DIAGNOSIS — R0789 Other chest pain: Secondary | ICD-10-CM

## 2019-02-13 DIAGNOSIS — I350 Nonrheumatic aortic (valve) stenosis: Secondary | ICD-10-CM | POA: Insufficient documentation

## 2019-02-13 DIAGNOSIS — J449 Chronic obstructive pulmonary disease, unspecified: Secondary | ICD-10-CM | POA: Insufficient documentation

## 2019-02-13 DIAGNOSIS — R0609 Other forms of dyspnea: Secondary | ICD-10-CM | POA: Insufficient documentation

## 2019-02-13 DIAGNOSIS — R0601 Orthopnea: Secondary | ICD-10-CM | POA: Insufficient documentation

## 2019-02-13 DIAGNOSIS — E559 Vitamin D deficiency, unspecified: Secondary | ICD-10-CM | POA: Insufficient documentation

## 2019-02-13 DIAGNOSIS — R0600 Dyspnea, unspecified: Secondary | ICD-10-CM | POA: Insufficient documentation

## 2019-02-13 MED ORDER — BUPROPION HCL ER (SR) 150 MG PO TB12: 150.0000 mg | ORAL_TABLET | Freq: Two times a day (BID) | ORAL | 1 refills | Status: DC

## 2019-02-13 MED ORDER — CHLORTHALIDONE 25 MG PO TABS
25.0000 mg | ORAL_TABLET | Freq: Every day | ORAL | 3 refills | Status: DC
Start: 2019-02-13 — End: 2019-06-06

## 2019-02-13 MED ORDER — POTASSIUM CHLORIDE ER 10 MEQ PO TBCR
10.0000 meq | EXTENDED_RELEASE_TABLET | Freq: Every day | ORAL | 1 refills | Status: DC
Start: 2019-02-13 — End: 2019-06-06

## 2019-02-13 NOTE — Progress Notes (Signed)
Primary Physician:  Christa See, FNP   Patient ID: [name] [name], female    DOB: Sep 10, 1940, 78 y.o.   MRN: [id number]  Subjective:    Chief Complaint  Patient presents with  . Chest Pain  . New Patient (Initial Visit)  . Shortness of Breath    HPI: [name] [name]  is a 78 y.o. female  with hypertension, hyperlipidemia, COPD, OA, depression, history of breast cancer s/p lumpectomy in 2005 and radiation and chemotherapy in 9924, non-alcoholic cirrhosis of liver in 2015, COPD, former tobacco use, referred to [place] for chest pain and shortness of breath by Christa See, FNP.   She has recently moved to [facility] from [place] in Nov 2019. She had normal coronary angiogram in 2008 with Dr. Radford Pax. She was previously followed by Cardiology in [place]. Last echo was in 2016 with LVEF of 26%, diastolic dysfunction, and mild aortic stenosis.   Patient has had dyspnea for several years; however, over the last few months has had worsening dyspnea on exertion, leg swelling that is present regardless of leg elevation, and orthopnea due to shortness of breath. She has not had any exertional chest pain. She does have occasional sharp left-sided chest pain after lying down at night. Generally last for a few minutes and goes away. She was previously exercising with her stationary bicycle and notices that she has to stop to rest sooner. Her weight has been increasing despite trying to watch her diet. She mostly eats at home and tries to avoid eating out.   She is a former smoker that quit in 2005. No alcohol or drug use.    Past Medical History:  Diagnosis Date  . Aortic stenosis   . Cancer ([place])   . Chest pain   . Chronic kidney disease   . Cirrhosis ([place])   . COPD (chronic obstructive pulmonary disease) ([place])   . DJD (degenerative joint disease)   . GERD (gastroesophageal reflux disease)   . Hyperlipidemia   . Hypertension   . Leg edema   . Orthopnea   . Palpitation   . SOB (shortness of breath)   .  Vitamin D deficiency      Social History   Socioeconomic History  . Marital status: Widowed    Spouse name: Not on file  . Number of children: 3  . Years of education: Not on file  . Highest education level: Not on file  Occupational History  . Not on file  Social Needs  . Financial resource strain: Not on file  . Food insecurity:    Worry: Not on file    Inability: Not on file  . Transportation needs:    Medical: Not on file    Non-medical: Not on file  Tobacco Use  . Smoking status: Former Smoker    Packs/day: 0.50    Years: 30.00    Pack years: 15.00    Types: Cigarettes    Start date: 02/12/1961    Last attempt to quit: 2005    Years since quitting: 15.4  . Smokeless tobacco: Never Used  Substance and Sexual Activity  . Alcohol use: Not on file  . Drug use: Not on file  . Sexual activity: Not on file  Lifestyle  . Physical activity:    Days per week: Not on file    Minutes per session: Not on file  . Stress: Not on file  Relationships  . Social connections:    Talks on phone: Not on file  Gets together: Not on file    Attends religious service: Not on file    Active member of club or organization: Not on file    Attends meetings of clubs or organizations: Not on file    Relationship status: Not on file  . Intimate partner violence:    Fear of current or ex partner: Not on file    Emotionally abused: Not on file    Physically abused: Not on file    Forced sexual activity: Not on file  Other Topics Concern  . Not on file  Social History Narrative  . Not on file    Review of Systems  Constitution: Positive for weight gain. Negative for decreased appetite, malaise/fatigue and weight loss.  Eyes: Negative for visual disturbance.  Cardiovascular: Positive for dyspnea on exertion, leg swelling and orthopnea. Negative for chest pain, claudication, palpitations and syncope.  Respiratory: Negative for hemoptysis and wheezing.   Endocrine: Negative for cold  intolerance and heat intolerance.  Hematologic/Lymphatic: Does not bruise/bleed easily.  Skin: Negative for nail changes.  Musculoskeletal: Negative for muscle weakness and myalgias.  Gastrointestinal: Negative for abdominal pain, change in bowel habit, nausea and vomiting.  Neurological: Negative for difficulty with concentration, dizziness, focal weakness and headaches.  Psychiatric/Behavioral: Negative for altered mental status and suicidal ideas.  All other systems reviewed and are negative.     Objective:  Blood pressure (!) 153/71, pulse 73, temperature 97.7 F (36.5 C), height 5\' 2"  (1.575 m), weight 207 lb (93.9 kg), SpO2 95 %. Body mass index is 37.86 kg/m.    Physical Exam  Constitutional: She is oriented to person, place, and time. Vital signs are normal. She appears well-developed and well-nourished.  HENT:  Head: Normocephalic and atraumatic.  Neck: Normal range of motion.  Cardiovascular: Normal rate, regular rhythm and intact distal pulses.  Murmur heard.  Harsh midsystolic murmur is present with a grade of 2/6 at the upper right sternal border radiating to the neck. Pulses:      Carotid pulses are on the right side with bruit and on the left side with bruit.      Femoral pulses are 1+ on the right side and 1+ on the left side.      Popliteal pulses are 1+ on the right side and 1+ on the left side.       Dorsalis pedis pulses are 2+ on the right side and 2+ on the left side.       Posterior tibial pulses are 2+ on the right side and 2+ on the left side.  Pulses difficult to feel due to bodily habitus 2+ pitting edema bilateral Bilateral varicosities   Pulmonary/Chest: Effort normal and breath sounds normal. No accessory muscle usage. No respiratory distress.  Abdominal: Soft. Bowel sounds are normal.  Musculoskeletal: Normal range of motion.  Neurological: She is alert and oriented to person, place, and time.  Skin: Skin is warm and dry.  Vitals  reviewed.  Radiology: No results found.  Laboratory examination:    CMP Latest Ref Rng & Units 05/15/2007 03/21/2007 01/19/2007  Glucose 70 - 99 mg/dL 93 180(H) 105(H)  BUN 6 - 23 mg/dL 18 19 17   Creatinine 0.40 - 1.20 mg/dL 0.69 0.76 0.7  Sodium 135 - 145 mEq/L 140 137 138  Potassium 3.5 - 5.3 mEq/L 4.2 4.2 4.5  Chloride 96 - 112 mEq/L 107 104 105  CO2 19 - 32 mEq/L 24 22 27   Calcium 8.4 - 10.5 mg/dL 9.6 9.3 9.3  Total Protein 6.0 - 8.3 g/dL 6.6 6.5 -  Total Bilirubin 0.3 - 1.2 mg/dL 0.6 0.7 -  Alkaline Phos 39 - 117 U/L 47 76 -  AST 0 - 37 U/L 18 37 -  ALT 0 - 35 U/L 26 63(H) -   CBC Latest Ref Rng & Units 05/15/2007 03/21/2007 01/19/2007  WBC 3.9 - 10.0 10e3/uL 7.5 5.4 7.3  Hemoglobin 11.6 - 15.9 g/dL 13.4 13.4 12.4  Hematocrit 34.8 - 46.6 % 37.1 38.1 35.9(L)  Platelets 145 - 400 10e3/uL 211 231 193   Lipid Panel  No results found for: CHOL, TRIG, HDL, CHOLHDL, VLDL, LDLCALC, LDLDIRECT HEMOGLOBIN A1C No results found for: HGBA1C, MPG TSH No results for input(s): TSH in the last 8760 hours.  PRN Meds:. There are no discontinued medications. Current Meds  Medication Sig  . amitriptyline (ELAVIL) 25 MG tablet Take 2 tablets by mouth at bedtime.  Marland Kitchen atenolol (TENORMIN) 25 MG tablet Take 1 tablet by mouth daily.  . Cinnamon 500 MG TABS Take by mouth daily.  Marland Kitchen docusate sodium (STOOL SOFTENER) 100 MG capsule Take 100 mg by mouth 2 (two) times daily.  Marland Kitchen loratadine (CLARITIN) 10 MG tablet Take 10 mg by mouth daily.  . Melatonin 5 MG CAPS Take 5 mg by mouth daily.  . Multiple Minerals-Vitamins (CAL-MAG-ZINC-D) TABS Take by mouth.  . Multiple Vitamins-Minerals (MULTIVITAMIN WITH MINERALS) tablet Take 1 tablet by mouth daily.  . naproxen sodium (ALEVE) 220 MG tablet Take 220 mg by mouth.  . NON FORMULARY Hemp cream  . traMADol (ULTRAM) 50 MG tablet Take 50 mg by mouth as needed.  . vitamin C (ASCORBIC ACID) 500 MG tablet Take 500 mg by mouth daily.    Cardiac Studies:   Echo  2016: LVEF 50%. Mild diastolic dysfunction. Mild aortic stenosis. Bilateral atrial enlargement.   Cath 01/06/2011:  1. The left main coronary is widely patent and bifurcates in the left      anterior descending artery and left circumflex artery.  2. The left anterior descending artery is widely patent throughout its      course of the apex.  It gives rise to a first diagonal which is      rather large and bifurcates into two daughter vessels both of which      are widely patent.  The ongoing LAD gives rise to a second diagonal      which is widely patent.  3. The left circumflex is widely patent throughout its course in the      AV groove.  It gives rise to a first small obtuse marginal one      branch which is widely patent and then gives rise to a second      larger obtuse marginal branch too which bifurcates into two      daughter vessels and is widely patent.  4. The right coronary is widely patent throughout its course and      distally bifurcates into the posterior descending artery and      posterior lateral artery both of which are widely patent.  5. Left ventriculography shows normal LV function, EF 60%, LVEDP 10-16      mmHg.  LV pressure 148/70 mmHg, aortic pressure was 155/81 mmHg.  6. Right heart cath data showed oxygen saturations in the right atrium      67%, RV 66%, PA 66%.  Cardiac output by Fick 3.8, cardiac index by      Fick 2.0, right atrial pressure  12/9 with a mean of 8 mmHg, right      ventricular pressure 35/80 with a mean of 13 mmHg.  PA pressure      36/17 with a mean of 27 mmHg.  Pulmonary capillary wedge pressure      23/23 with a mean of 17 mmHg.  Assessment:   Acute diastolic heart failure ([place]) - Plan: EKG 12-Lead, PCV ECHOCARDIOGRAM COMPLETE, PCV MYOCARDIAL PERFUSION WITH LEXISCAN, Basic Metabolic Panel (BMET)  Orthopnea - Plan: PCV ECHOCARDIOGRAM COMPLETE  SOB (shortness of breath) - Plan: EKG 12-Lead, PCV MYOCARDIAL PERFUSION WITH  LEXISCAN  Atypical chest pain - Plan: PCV MYOCARDIAL PERFUSION WITH LEXISCAN  Nonrheumatic aortic valve stenosis - Plan: PCV ECHOCARDIOGRAM COMPLETE  Chronic obstructive pulmonary disease, unspecified COPD type ([place])  Abnormal EKG - Plan: PCV MYOCARDIAL PERFUSION WITH LEXISCAN  Bilateral carotid bruits - Plan: PCV CAROTID DUPLEX (BILATERAL)  EKG 02/13/2019: Normal sinus rhythm at 70 bpm, left atrial enlargement, left axis deviation, anteroseptal infarct old. IVCD. No evidence of ischemia. Abnormal EKG.   Recommendations:   Patient has had progressively worsening dyspnea on exertion, decreased exercise tolerance, orthopnea, and leg edema over the last several months.  Symptoms and physical exam is consistent with diastolic heart failure likely contributed by obesity and hypertension.  I have stressed the importance of diet modifications to promote weight loss.  I suspect that with weight loss, her symptoms would significantly improved.  Diet modifications were discussed at length.  I have given her prescription for Wellbutrin to help with curbing her appetite.  She is currently on amitriptyline for depression, no interaction is noted. will obtain echocardiogram for further evaluation.  She does have aortic stenotic murmur on physical exam.  Also has bilateral carotid bruits, will obtain bilateral carotid duplex for evaluation.  She is currently on atenolol for hypertension that is elevated in our office today, will start her on chlorthalidone 25 mg daily along with potassium supplement best for hypertension and diastolic heart failure.    Dyspnea and orthopnea may also be contributed by COPD from former tobacco use.  She is also having decreased exercise tolerance.  She is previously had normal coronary angiogram in 2008.  She is now having symptoms of chest pain at night that are atypical, but in view of her risk factors, worsening symptoms and abnormal EKG, will obtain Lexiscan nuclear stress  testing with 2-day protocol for further evaluation.  I will see her back in approximately 6 weeks for follow-up, but encouraged her to contact me sooner if needed.  I do not have recent labs from PCP office, if lipids have not recently been performed, will order this at her next office visit for further stratification.   *I have discussed this case with Dr. Einar Gip and he personally examined the patient and participated in formulating the plan.*   Miquel Dunn, MSN, APRN, FNP-C [facility]. [place] Office: [phone] Fax: [phone]

## 2019-02-13 NOTE — Patient Instructions (Signed)

## 2019-02-21 ENCOUNTER — Other Ambulatory Visit: Payer: Self-pay | Admitting: Cardiology

## 2019-02-22 LAB — BASIC METABOLIC PANEL
BUN/Creatinine Ratio: 24 (ref 12–28)
BUN: 22 mg/dL (ref 8–27)
CO2: 25 mmol/L (ref 20–29)
Calcium: 9.6 mg/dL (ref 8.7–10.3)
Chloride: 99 mmol/L (ref 96–106)
Creatinine, Ser: 0.93 mg/dL (ref 0.57–1.00)
GFR calc Af Amer: 69 mL/min/{1.73_m2} (ref >59)
GFR calc non Af Amer: 60 mL/min/{1.73_m2} (ref >59)
Glucose: 120 mg/dL — ABNORMAL HIGH (ref 65–99)
Potassium: 3.9 mmol/L (ref 3.5–5.2)
Sodium: 141 mmol/L (ref 134–144)

## 2019-02-26 NOTE — Progress Notes (Signed)
Let patient know labs look good, kidney function is stable

## 2019-03-12 ENCOUNTER — Other Ambulatory Visit: Payer: Self-pay

## 2019-03-12 ENCOUNTER — Ambulatory Visit (INDEPENDENT_AMBULATORY_CARE_PROVIDER_SITE_OTHER): Payer: Medicare Other

## 2019-03-12 DIAGNOSIS — R0602 Shortness of breath: Secondary | ICD-10-CM

## 2019-03-12 DIAGNOSIS — R0789 Other chest pain: Secondary | ICD-10-CM

## 2019-03-12 DIAGNOSIS — R9431 Abnormal electrocardiogram [ECG] [EKG]: Secondary | ICD-10-CM

## 2019-03-12 DIAGNOSIS — I5031 Acute diastolic (congestive) heart failure: Secondary | ICD-10-CM

## 2019-03-19 ENCOUNTER — Ambulatory Visit: Payer: Medicare Other

## 2019-03-19 ENCOUNTER — Other Ambulatory Visit: Payer: Self-pay

## 2019-03-19 ENCOUNTER — Other Ambulatory Visit: Payer: Medicare Other

## 2019-03-19 ENCOUNTER — Ambulatory Visit (INDEPENDENT_AMBULATORY_CARE_PROVIDER_SITE_OTHER): Payer: Medicare Other

## 2019-03-19 DIAGNOSIS — R0601 Orthopnea: Secondary | ICD-10-CM | POA: Diagnosis not present

## 2019-03-19 DIAGNOSIS — R0989 Other specified symptoms and signs involving the circulatory and respiratory systems: Secondary | ICD-10-CM

## 2019-03-19 DIAGNOSIS — I5031 Acute diastolic (congestive) heart failure: Secondary | ICD-10-CM | POA: Diagnosis not present

## 2019-03-19 DIAGNOSIS — I350 Nonrheumatic aortic (valve) stenosis: Secondary | ICD-10-CM

## 2019-03-23 ENCOUNTER — Other Ambulatory Visit: Payer: Medicare Other

## 2019-03-26 ENCOUNTER — Encounter: Payer: Self-pay | Admitting: Cardiology

## 2019-03-28 ENCOUNTER — Other Ambulatory Visit: Payer: Self-pay

## 2019-03-28 ENCOUNTER — Ambulatory Visit (INDEPENDENT_AMBULATORY_CARE_PROVIDER_SITE_OTHER): Payer: Medicare Other | Admitting: Cardiology

## 2019-03-28 ENCOUNTER — Encounter: Payer: Self-pay | Admitting: Cardiology

## 2019-03-28 VITALS — BP 120/62 | HR 82 | Temp 98.4°F | Ht 62.0 in | Wt 202.0 lb

## 2019-03-28 DIAGNOSIS — R002 Palpitations: Secondary | ICD-10-CM

## 2019-03-28 DIAGNOSIS — I5032 Chronic diastolic (congestive) heart failure: Secondary | ICD-10-CM

## 2019-03-28 DIAGNOSIS — I350 Nonrheumatic aortic (valve) stenosis: Secondary | ICD-10-CM

## 2019-03-28 DIAGNOSIS — R42 Dizziness and giddiness: Secondary | ICD-10-CM | POA: Diagnosis not present

## 2019-03-28 NOTE — Progress Notes (Signed)
Primary Physician:  Christa See, FNP   Patient ID: [name] [name], female    DOB: [birth year], 78 y.o.   MRN: [id number]  Subjective:    Chief Complaint  Patient presents with  . Congestive Heart Failure  . Follow-up    6wk C/o dizziness  . Results    HPI: [name] [name]  is a 78 y.o. female  with hypertension, hyperlipidemia, COPD, OA, depression, history of breast cancer s/p lumpectomy in 2005 and radiation and chemotherapy in 2440, non-alcoholic cirrhosis of liver in 2015, COPD, former tobacco use, recently evaluated by [place] for chest pain and shortness of breath.  Patient has had dyspnea for several years; however, has recently worsened. Felt to be related to diastolic heart failure. Also had symptoms of atypical chest pain and leg swelling. She was started on chlorthalidone. Weight has been increasing; therefore, she was started on Wellbutrin. She underwent echocardiogram and nuclear stress testing and now presents for follow up.  She reports since being on the medications, she has had dizziness with frequent falls. States that dizziness generally happens with getting hot or with position changes. She reports losing her balance several times a day. No syncope. She has noticed improvement in leg swelling and dyspnea. She has been able to lose weight.  She reports history of palpitations that resolved with starting atenolol several years ago. She has not had any recent palpitations.   She has recently moved to [facility] from [place] in Nov 2019. She had normal coronary angiogram in 2008 with Dr. Radford Pax. She was previously followed by Cardiology in [place].   She is a former smoker that quit in 2005. No alcohol or drug use.    Past Medical History:  Diagnosis Date  . Aortic stenosis   . Cancer ([place])   . Chest pain   . Chronic kidney disease   . Cirrhosis ([place])   . COPD (chronic obstructive pulmonary disease) ([place])   . DJD (degenerative joint disease)   . GERD (gastroesophageal reflux  disease)   . Hyperlipidemia   . Hypertension   . Leg edema   . Orthopnea   . Palpitation   . SOB (shortness of breath)   . Vitamin D deficiency      Social History   Socioeconomic History  . Marital status: Widowed    Spouse name: Not on file  . Number of children: 3  . Years of education: Not on file  . Highest education level: Not on file  Occupational History  . Not on file  Social Needs  . Financial resource strain: Not on file  . Food insecurity    Worry: Not on file    Inability: Not on file  . Transportation needs    Medical: Not on file    Non-medical: Not on file  Tobacco Use  . Smoking status: Former Smoker    Packs/day: 0.50    Years: 30.00    Pack years: 15.00    Types: Cigarettes    Start date: 02/12/1961    Quit date: 2005    Years since quitting: 15.5  . Smokeless tobacco: Never Used  Substance and Sexual Activity  . Alcohol use: Never    Frequency: Never  . Drug use: Not on file  . Sexual activity: Not on file  Lifestyle  . Physical activity    Days per week: Not on file    Minutes per session: Not on file  . Stress: Not on file  Relationships  . Social connections  Talks on phone: Not on file    Gets together: Not on file    Attends religious service: Not on file    Active member of club or organization: Not on file    Attends meetings of clubs or organizations: Not on file    Relationship status: Not on file  . Intimate partner violence    Fear of current or ex partner: Not on file    Emotionally abused: Not on file    Physically abused: Not on file    Forced sexual activity: Not on file  Other Topics Concern  . Not on file  Social History Narrative  . Not on file    Review of Systems  Constitution: Positive for weight loss. Negative for decreased appetite and malaise/fatigue.  Eyes: Negative for visual disturbance.  Cardiovascular: Positive for dyspnea on exertion and leg swelling. Negative for chest pain, claudication,  orthopnea, palpitations and syncope.  Respiratory: Negative for hemoptysis and wheezing.   Endocrine: Negative for cold intolerance and heat intolerance.  Hematologic/Lymphatic: Does not bruise/bleed easily.  Skin: Negative for nail changes.  Musculoskeletal: Negative for muscle weakness and myalgias.  Gastrointestinal: Negative for abdominal pain, change in bowel habit, nausea and vomiting.  Neurological: Positive for dizziness. Negative for difficulty with concentration, focal weakness and headaches.  Psychiatric/Behavioral: Negative for altered mental status and suicidal ideas.  All other systems reviewed and are negative.     Objective:  Temperature 98.4 F (36.9 C), height 5\' 2"  (1.575 m), weight 202 lb 6.4 oz (91.8 kg). Body mass index is 37.02 kg/m.    Physical Exam  Constitutional: She is oriented to person, place, and time. Vital signs are normal. She appears well-developed and well-nourished.  HENT:  Head: Normocephalic and atraumatic.  Neck: Normal range of motion.  Cardiovascular: Normal rate, regular rhythm and intact distal pulses.  Murmur heard.  Harsh midsystolic murmur is present with a grade of 2/6 at the upper right sternal border radiating to the neck. Pulses:      Carotid pulses are on the right side with bruit and on the left side with bruit.      Femoral pulses are 1+ on the right side and 1+ on the left side.      Popliteal pulses are 1+ on the right side and 1+ on the left side.       Dorsalis pedis pulses are 2+ on the right side and 2+ on the left side.       Posterior tibial pulses are 2+ on the right side and 2+ on the left side.  Pulses difficult to feel due to bodily habitus 2+ pitting edema bilateral Bilateral varicosities   Pulmonary/Chest: Effort normal and breath sounds normal. No accessory muscle usage. No respiratory distress.  Abdominal: Soft. Bowel sounds are normal.  Musculoskeletal: Normal range of motion.  Neurological: She is alert and  oriented to person, place, and time.  Skin: Skin is warm and dry.  Vitals reviewed.  Radiology: No results found.  Laboratory examination:    CMP Latest Ref Rng & Units 02/21/2019 05/15/2007 03/21/2007  Glucose 65 - 99 mg/dL 120(H) 93 180(H)  BUN 8 - 27 mg/dL 22 18 19   Creatinine 0.57 - 1.00 mg/dL 0.93 0.69 0.76  Sodium 134 - 144 mmol/L 141 140 137  Potassium 3.5 - 5.2 mmol/L 3.9 4.2 4.2  Chloride 96 - 106 mmol/L 99 107 104  CO2 20 - 29 mmol/L 25 24 22   Calcium 8.7 - 10.3 mg/dL 9.6  9.6 9.3  Total Protein 6.0 - 8.3 g/dL - 6.6 6.5  Total Bilirubin 0.3 - 1.2 mg/dL - 0.6 0.7  Alkaline Phos 39 - 117 U/L - 47 76  AST 0 - 37 U/L - 18 37  ALT 0 - 35 U/L - 26 63(H)   CBC Latest Ref Rng & Units 05/15/2007 03/21/2007 01/19/2007  WBC 3.9 - 10.0 10e3/uL 7.5 5.4 7.3  Hemoglobin 11.6 - 15.9 g/dL 13.4 13.4 12.4  Hematocrit 34.8 - 46.6 % 37.1 38.1 35.9(L)  Platelets 145 - 400 10e3/uL 211 231 193   Lipid Panel  No results found for: CHOL, TRIG, HDL, CHOLHDL, VLDL, LDLCALC, LDLDIRECT HEMOGLOBIN A1C No results found for: HGBA1C, MPG TSH No results for input(s): TSH in the last 8760 hours.  PRN Meds:. Medications Discontinued During This Encounter  Medication Reason  . Cinnamon 500 MG TABS   . docusate sodium (STOOL SOFTENER) 100 MG capsule   . Multiple Vitamins-Minerals (MULTIVITAMIN WITH MINERALS) tablet   . NON FORMULARY   . traMADol (ULTRAM) 50 MG tablet    Current Meds  Medication Sig  . amitriptyline (ELAVIL) 25 MG tablet Take 2 tablets by mouth at bedtime.  Marland Kitchen atenolol (TENORMIN) 25 MG tablet Take 1 tablet by mouth daily.  Marland Kitchen buPROPion (WELLBUTRIN SR) 150 MG 12 hr tablet Take 1 tablet (150 mg total) by mouth 2 (two) times daily.  . chlorthalidone (HYGROTON) 25 MG tablet Take 1 tablet (25 mg total) by mouth daily. In the morning  . loratadine (CLARITIN) 10 MG tablet Take 10 mg by mouth daily.  . Melatonin 5 MG CAPS Take 5 mg by mouth daily.  . Multiple Minerals-Vitamins (CAL-MAG-ZINC-D)  TABS Take by mouth.  . naproxen sodium (ALEVE) 220 MG tablet Take 220 mg by mouth.  . potassium chloride (K-DUR) 10 MEQ tablet Take 1 tablet (10 mEq total) by mouth daily.  . vitamin C (ASCORBIC ACID) 500 MG tablet Take 500 mg by mouth daily.    Cardiac Studies:   Carotid artery duplex  03/19/2019: No hemodynamically significant stenosis noted in bilateral internal carotid arteries. Minimal heterogeneous plaque noted in the left ICA.   The right CCA velocity is minimally elevated but no significant plaque noted. Antegrade right vertebral artery flow. Antegrade left vertebral artery flow.  Lexiscan Myoview stress test 03/12/2019: Lexiscan stress test was performed. Stress EKG is non-diagnostic, as this is pharmacological stress test. Normal myocardial perfusion, with mild uniform breast tissue attenuation in inferior myocardium. LVEF 53%. Low risk study.   Echocardiogram 03/19/2019 :   1. Normal LV systolic function with EF 64%. Left ventricle cavity is normal in size. Normal global wall motion. Doppler evidence of grade I (impaired) diastolic dysfunction, elevated LAP. Calculational diastolic dysfunction may not be accurate due to presence of mitral annular calcification. Calculated EF 64%. 2. Left atrial cavity is mildly dilated at 3.9 cm. 3. Mild calcification of the aortic valve annulus. Moderate aortic valve leaflet calcification, mostly of the non coronary cusp with mildly restricted aortic valve leaflets. Mild aortic valve stenosis. Aortic valve peak pressure gradient of 20 and mean gradient of 11.5 mmHg, calculated aortic valve area 1.16 cm. No aortic valve regurgitation noted. 4. Mild mitral valve leaflet thickening with mild calcification. Trace mitral regurgitation. 5. Mild eccenteric tricuspid regurgitation. Inadequate TR jet to estimate pulmonary artery systolic pressure. 6. IVC is not well visualized.  Cath 01/06/2011:  1. The left main coronary is widely patent and  bifurcates in the left  anterior descending artery and left circumflex artery.  2. The left anterior descending artery is widely patent throughout its      course of the apex.  It gives rise to a first diagonal which is      rather large and bifurcates into two daughter vessels both of which      are widely patent.  The ongoing LAD gives rise to a second diagonal      which is widely patent.  3. The left circumflex is widely patent throughout its course in the      AV groove.  It gives rise to a first small obtuse marginal one      branch which is widely patent and then gives rise to a second      larger obtuse marginal branch too which bifurcates into two      daughter vessels and is widely patent.  4. The right coronary is widely patent throughout its course and      distally bifurcates into the posterior descending artery and      posterior lateral artery both of which are widely patent.  5. Left ventriculography shows normal LV function, EF 60%, LVEDP 10-16      mmHg.  LV pressure 148/70 mmHg, aortic pressure was 155/81 mmHg.  6. Right heart cath data showed oxygen saturations in the right atrium      67%, RV 66%, PA 66%.  Cardiac output by Fick 3.8, cardiac index by      Fick 2.0, right atrial pressure 12/9 with a mean of 8 mmHg, right      ventricular pressure 35/80 with a mean of 13 mmHg.  PA pressure      36/17 with a mean of 27 mmHg.  Pulmonary capillary wedge pressure      23/23 with a mean of 17 mmHg.  Assessment:   No diagnosis found.  EKG 02/13/2019: Normal sinus rhythm at 70 bpm, left atrial enlargement, left axis deviation, anteroseptal infarct old. IVCD. No evidence of ischemia. Abnormal EKG.   Recommendations:   I discussed recently obtained test results with the patient, no evidence of carotid stenosis.  Had normal perfusion by Lexiscan nuclear stress testing.  She was noted to have normal LVEF, grade 1 diastolic dysfunction, and mild aortic stenosis.  She will  need repeat echocardiogram in approximately 2 years for continued surveillance.    She continues to have shortness of breath, but has improved as well as her leg swelling with addition of chlorthalidone.  Continue to feel that her symptoms are related to diastolic heart failure.  She does admit since being on chlorthalidone and also Wellbutrin she has been falling frequently with dizziness.  She is not noted to be orthostatic today.  She feels medication may be contributing, particularly Wellbutrin.  I have asked her to hold the medicine for 1 week to see if her symptoms will improve.  If she continues to have frequent falls and dizziness despite this, will resume Wellbutrin and potentially try switching her chlorthalidone.  She has a history of palpitations, that she states are well controlled with atenolol.  She has not had any recent episodes of palpitations will continue to monitor.  Her falls are not related to palpitations.  She has been able to lose 5 pounds since last seen by me, I have congratulated her on this and provided positive reinforcement.  Encouraged her to continue with diet changes to help.  I will see her back in 4 weeks for  close monitoring, but she will contact me in 1 week to let me know how she is doing without her Wellbutrin.    Miquel Dunn, MSN, APRN, FNP-C [facility]. [place] Office: [phone] Fax: [phone]

## 2019-03-30 ENCOUNTER — Ambulatory Visit: Payer: Medicare Other | Admitting: Cardiology

## 2019-04-04 ENCOUNTER — Encounter: Payer: Self-pay | Admitting: Cardiology

## 2019-04-11 ENCOUNTER — Telehealth: Payer: Self-pay

## 2019-04-11 NOTE — Telephone Encounter (Signed)
Pt aware.//ah

## 2019-04-11 NOTE — Telephone Encounter (Signed)
Pt called stating that she was told to stop bupropion and call us in 10 days with update-she said that she has not had any dizzy spells and has not fallen.//ah

## 2019-04-11 NOTE — Telephone Encounter (Signed)
Okay thats good. Glad we found the cause. She can stay off of it

## 2019-04-25 ENCOUNTER — Ambulatory Visit: Payer: Medicare Other | Admitting: Cardiology

## 2019-06-06 ENCOUNTER — Encounter: Payer: Self-pay | Admitting: Cardiology

## 2019-06-06 ENCOUNTER — Other Ambulatory Visit: Payer: Self-pay

## 2019-06-06 ENCOUNTER — Ambulatory Visit: Payer: Medicare Other | Admitting: Cardiology

## 2019-06-06 VITALS — BP 140/72 | HR 78 | Ht 62.0 in | Wt 211.8 lb

## 2019-06-06 DIAGNOSIS — I5032 Chronic diastolic (congestive) heart failure: Secondary | ICD-10-CM

## 2019-06-06 DIAGNOSIS — R0602 Shortness of breath: Secondary | ICD-10-CM | POA: Diagnosis not present

## 2019-06-06 DIAGNOSIS — R600 Localized edema: Secondary | ICD-10-CM

## 2019-06-06 MED ORDER — CHLORTHALIDONE 50 MG PO TABS
50.0000 mg | ORAL_TABLET | Freq: Every day | ORAL | 1 refills | Status: DC
Start: 2019-06-06 — End: 2019-08-20

## 2019-06-06 NOTE — Progress Notes (Signed)
Primary Physician:  Christa See, FNP   Patient ID: [name] [name], female    DOB: [birth year], 78 y.o.   MRN: [id number]  Subjective:    Chief Complaint  Patient presents with  . Congestive Heart Failure  . Shortness of Breath  . Weight Gain  . Follow-up    HPI: [name] [name]  is a 78 y.o. female  with hypertension, hyperlipidemia, COPD, OA, depression, history of breast cancer s/p lumpectomy in 2005 and radiation and chemotherapy in [id number], non-alcoholic cirrhosis of liver in 2015, COPD, former tobacco use, recently evaluated by [place] for chest pain and shortness of breath.  She underwent Lexiscan nuclear stress testing in July 2020 that was considered low risk study.  Echocardiogram revealed grade 1 diastolic dysfunction, normal LVEF.  Her last office visit, she had complained of dizziness and frequent falls since being on Wellbutrin; however, this did help with her weight loss.  I have since discontinued her Wellbutrin but she reports her symptoms of dizziness and falling have resolved.  Unfortunately she has regained her weight and is now having worsening shortness of breath and leg swelling.  No exertional chest discomfort.  She is trying to walk daily and is recently started back working a part-time job.  States that she is making diet changes to try to lose weight, but is very frustrated with her inability to do so.  She reports history of palpitations that resolved with starting atenolol several years ago. She has not had any recent palpitations.   She has recently moved to [facility] from [place] in Nov 2019. She had normal coronary angiogram in 2008 with Dr. Radford Pax. She was previously followed by Cardiology in [place].   She is a former smoker that quit in 2005. No alcohol or drug use.    Past Medical History:  Diagnosis Date  . Aortic stenosis   . Cancer ([place])   . Chest pain   . Chronic kidney disease   . Cirrhosis ([place])   . COPD (chronic obstructive pulmonary disease) ([place])   . DJD  (degenerative joint disease)   . GERD (gastroesophageal reflux disease)   . Hyperlipidemia   . Hypertension   . Leg edema   . Orthopnea   . Palpitation   . SOB (shortness of breath)   . Vitamin D deficiency      Social History   Socioeconomic History  . Marital status: Widowed    Spouse name: Not on file  . Number of children: 3  . Years of education: Not on file  . Highest education level: Not on file  Occupational History  . Not on file  Social Needs  . Financial resource strain: Not on file  . Food insecurity    Worry: Not on file    Inability: Not on file  . Transportation needs    Medical: Not on file    Non-medical: Not on file  Tobacco Use  . Smoking status: Former Smoker    Packs/day: 0.50    Years: 30.00    Pack years: 15.00    Types: Cigarettes    Start date: 02/12/1961    Quit date: 2005    Years since quitting: 15.7  . Smokeless tobacco: Never Used  Substance and Sexual Activity  . Alcohol use: Never    Frequency: Never  . Drug use: Not on file  . Sexual activity: Not on file  Lifestyle  . Physical activity    Days per week: Not on file  Minutes per session: Not on file  . Stress: Not on file  Relationships  . Social Herbalist on phone: Not on file    Gets together: Not on file    Attends religious service: Not on file    Active member of club or organization: Not on file    Attends meetings of clubs or organizations: Not on file    Relationship status: Not on file  . Intimate partner violence    Fear of current or ex partner: Not on file    Emotionally abused: Not on file    Physically abused: Not on file    Forced sexual activity: Not on file  Other Topics Concern  . Not on file  Social History Narrative  . Not on file    Review of Systems  Constitution: Positive for weight loss. Negative for decreased appetite and malaise/fatigue.  Eyes: Negative for visual disturbance.  Cardiovascular: Positive for dyspnea on exertion  and leg swelling. Negative for chest pain, claudication, orthopnea, palpitations and syncope.  Respiratory: Negative for hemoptysis and wheezing.   Endocrine: Negative for cold intolerance and heat intolerance.  Hematologic/Lymphatic: Does not bruise/bleed easily.  Skin: Negative for nail changes.  Musculoskeletal: Negative for muscle weakness and myalgias.  Gastrointestinal: Negative for abdominal pain, change in bowel habit, nausea and vomiting.  Neurological: Positive for dizziness. Negative for difficulty with concentration, focal weakness and headaches.  Psychiatric/Behavioral: Negative for altered mental status and suicidal ideas.  All other systems reviewed and are negative.     Objective:  Blood pressure 140/72, pulse 78, height 5\' 2"  (1.575 m), weight 211 lb 12.8 oz (96.1 kg), SpO2 95 %. Body mass index is 38.74 kg/m.    Physical Exam  Constitutional: She is oriented to person, place, and time. Vital signs are normal. She appears well-developed and well-nourished.  HENT:  Head: Normocephalic and atraumatic.  Neck: Normal range of motion.  Cardiovascular: Normal rate, regular rhythm and intact distal pulses.  Murmur heard.  Harsh midsystolic murmur is present with a grade of 2/6 at the upper right sternal border radiating to the neck. Pulses:      Carotid pulses are on the right side with bruit and on the left side with bruit.      Femoral pulses are 1+ on the right side and 1+ on the left side.      Popliteal pulses are 1+ on the right side and 1+ on the left side.       Dorsalis pedis pulses are 2+ on the right side and 2+ on the left side.       Posterior tibial pulses are 2+ on the right side and 2+ on the left side.  Pulses difficult to feel due to bodily habitus 2+ pitting edema bilateral Bilateral varicosities   Pulmonary/Chest: Effort normal and breath sounds normal. No accessory muscle usage. No respiratory distress.  Abdominal: Soft. Bowel sounds are normal.   Musculoskeletal: Normal range of motion.  Neurological: She is alert and oriented to person, place, and time.  Skin: Skin is warm and dry.  Vitals reviewed.  Radiology: No results found.  Laboratory examination:    CMP Latest Ref Rng & Units 02/21/2019 05/15/2007 03/21/2007  Glucose 65 - 99 mg/dL 120(H) 93 180(H)  BUN 8 - 27 mg/dL 22 18 19   Creatinine 0.57 - 1.00 mg/dL 0.93 0.69 0.76  Sodium 134 - 144 mmol/L 141 140 137  Potassium 3.5 - 5.2 mmol/L 3.9 4.2 4.2  Chloride 96 - 106 mmol/L 99 107 104  CO2 20 - 29 mmol/L 25 24 22   Calcium 8.7 - 10.3 mg/dL 9.6 9.6 9.3  Total Protein 6.0 - 8.3 g/dL - 6.6 6.5  Total Bilirubin 0.3 - 1.2 mg/dL - 0.6 0.7  Alkaline Phos 39 - 117 U/L - 47 76  AST 0 - 37 U/L - 18 37  ALT 0 - 35 U/L - 26 63(H)   CBC Latest Ref Rng & Units 05/15/2007 03/21/2007 01/19/2007  WBC 3.9 - 10.0 10e3/uL 7.5 5.4 7.3  Hemoglobin 11.6 - 15.9 g/dL 13.4 13.4 12.4  Hematocrit 34.8 - 46.6 % 37.1 38.1 35.9(L)  Platelets 145 - 400 10e3/uL 211 231 193   Lipid Panel  No results found for: CHOL, TRIG, HDL, CHOLHDL, VLDL, LDLCALC, LDLDIRECT HEMOGLOBIN A1C No results found for: HGBA1C, MPG TSH No results for input(s): TSH in the last 8760 hours.  PRN Meds:. Medications Discontinued During This Encounter  Medication Reason  . buPROPion (WELLBUTRIN SR) 150 MG 12 hr tablet Error  . potassium chloride (K-DUR) 10 MEQ tablet Error  . chlorthalidone (HYGROTON) 25 MG tablet Dose change   Current Meds  Medication Sig  . amitriptyline (ELAVIL) 25 MG tablet Take 2 tablets by mouth at bedtime.  Marland Kitchen atenolol (TENORMIN) 25 MG tablet Take 1 tablet by mouth daily.  Marland Kitchen loratadine (CLARITIN) 10 MG tablet Take 10 mg by mouth daily.  . Melatonin 5 MG CAPS Take 5 mg by mouth daily.  . Multiple Minerals-Vitamins (CAL-MAG-ZINC-D) TABS Take by mouth.  . naproxen sodium (ALEVE) 220 MG tablet Take 220 mg by mouth.  . vitamin C (ASCORBIC ACID) 500 MG tablet Take 500 mg by mouth daily.  . [DISCONTINUED]  chlorthalidone (HYGROTON) 25 MG tablet Take 1 tablet (25 mg total) by mouth daily. In the morning    Cardiac Studies:   Carotid artery duplex  03/19/2019: No hemodynamically significant stenosis noted in bilateral internal carotid arteries. Minimal heterogeneous plaque noted in the left ICA.   The right CCA velocity is minimally elevated but no significant plaque noted. Antegrade right vertebral artery flow. Antegrade left vertebral artery flow.  Lexiscan Myoview stress test 03/12/2019: Lexiscan stress test was performed. Stress EKG is non-diagnostic, as this is pharmacological stress test. Normal myocardial perfusion, with mild uniform breast tissue attenuation in inferior myocardium. LVEF 53%. Low risk study.   Echocardiogram 03/19/2019 :   1. Normal LV systolic function with EF 64%. Left ventricle cavity is normal in size. Normal global wall motion. Doppler evidence of grade I (impaired) diastolic dysfunction, elevated LAP. Calculational diastolic dysfunction may not be accurate due to presence of mitral annular calcification. Calculated EF 64%. 2. Left atrial cavity is mildly dilated at 3.9 cm. 3. Mild calcification of the aortic valve annulus. Moderate aortic valve leaflet calcification, mostly of the non coronary cusp with mildly restricted aortic valve leaflets. Mild aortic valve stenosis. Aortic valve peak pressure gradient of 20 and mean gradient of 11.5 mmHg, calculated aortic valve area 1.16 cm. No aortic valve regurgitation noted. 4. Mild mitral valve leaflet thickening with mild calcification. Trace mitral regurgitation. 5. Mild eccenteric tricuspid regurgitation. Inadequate TR jet to estimate pulmonary artery systolic pressure. 6. IVC is not well visualized.  Cath 01/06/2011:  1. The left main coronary is widely patent and bifurcates in the left      anterior descending artery and left circumflex artery.  2. The left anterior descending artery is widely patent throughout  its  course of the apex.  It gives rise to a first diagonal which is      rather large and bifurcates into two daughter vessels both of which      are widely patent.  The ongoing LAD gives rise to a second diagonal      which is widely patent.  3. The left circumflex is widely patent throughout its course in the      AV groove.  It gives rise to a first small obtuse marginal one      branch which is widely patent and then gives rise to a second      larger obtuse marginal branch too which bifurcates into two      daughter vessels and is widely patent.  4. The right coronary is widely patent throughout its course and      distally bifurcates into the posterior descending artery and      posterior lateral artery both of which are widely patent.  5. Left ventriculography shows normal LV function, EF 60%, LVEDP 10-16      mmHg.  LV pressure 148/70 mmHg, aortic pressure was 155/81 mmHg.  6. Right heart cath data showed oxygen saturations in the right atrium      67%, RV 66%, PA 66%.  Cardiac output by Fick 3.8, cardiac index by      Fick 2.0, right atrial pressure 12/9 with a mean of 8 mmHg, right      ventricular pressure 35/80 with a mean of 13 mmHg.  PA pressure      36/17 with a mean of 27 mmHg.  Pulmonary capillary wedge pressure      23/23 with a mean of 17 mmHg.  Assessment:   Chronic diastolic (congestive) heart failure (HCC)  Morbid obesity (HCC)  SOB (shortness of breath)  Bilateral leg edema  EKG 02/13/2019: Normal sinus rhythm at 70 bpm, left atrial enlargement, left axis deviation, anteroseptal infarct old. IVCD. No evidence of ischemia. Abnormal EKG.   Recommendations:   Since being off Wellbutrin, dizziness and frequent falls have resolved. She has now gained back approximately 10 lbs, and has had worsening dyspnea and leg edema with this. I suspect that her diastolic dysfunction is secondary to her obesity.  She is tried making diet changes but is very frustrated  with her inability to lose weight.  I will make referral to weight loss center for further recommendations and evaluation.  I think she would benefit from specialized diet plan to help her with her weight loss.  Encouraged her to continue with her diet modifications and exercise.  Her blood pressure is stable.  I will further increase her chlorthalidone to 50 mg daily to help with her leg swelling and diastolic dysfunction.  I will plan to see her back in 3 months for follow-up on diastolic heart failure and her efforts towards weight loss.    Miquel Dunn, MSN, APRN, FNP-C [facility]. [place] Office: [phone] Fax: [phone]

## 2019-08-08 ENCOUNTER — Other Ambulatory Visit: Payer: Self-pay

## 2019-08-08 MED ORDER — AMITRIPTYLINE HCL 25 MG PO TABS: 50.0000 mg | ORAL_TABLET | Freq: Every day | ORAL | 1 refills | Status: DC

## 2019-08-08 NOTE — Telephone Encounter (Signed)
Can this be refilled? 

## 2019-08-20 ENCOUNTER — Other Ambulatory Visit: Payer: Self-pay

## 2019-08-20 MED ORDER — CHLORTHALIDONE 50 MG PO TABS: 50.0000 mg | ORAL_TABLET | Freq: Every day | ORAL | 1 refills | Status: DC

## 2019-09-05 ENCOUNTER — Ambulatory Visit: Payer: Medicare Other | Admitting: Cardiology

## 2019-09-05 ENCOUNTER — Encounter: Payer: Self-pay | Admitting: Cardiology

## 2019-09-05 ENCOUNTER — Ambulatory Visit: Payer: Medicare Other

## 2019-09-05 ENCOUNTER — Other Ambulatory Visit: Payer: Self-pay

## 2019-09-05 VITALS — BP 143/83 | HR 103 | Temp 97.6°F | Resp 14 | Ht 62.0 in | Wt 212.7 lb

## 2019-09-05 DIAGNOSIS — I5032 Chronic diastolic (congestive) heart failure: Secondary | ICD-10-CM

## 2019-09-05 DIAGNOSIS — R002 Palpitations: Secondary | ICD-10-CM

## 2019-09-05 DIAGNOSIS — I10 Essential (primary) hypertension: Secondary | ICD-10-CM

## 2019-09-05 DIAGNOSIS — I350 Nonrheumatic aortic (valve) stenosis: Secondary | ICD-10-CM

## 2019-09-05 DIAGNOSIS — R600 Localized edema: Secondary | ICD-10-CM | POA: Diagnosis not present

## 2019-09-05 MED ORDER — SPIRONOLACTONE 25 MG PO TABS: 25.0000 mg | ORAL_TABLET | Freq: Every day | ORAL | 2 refills | Status: DC

## 2019-09-05 NOTE — Patient Instructions (Signed)
Please take Chlorthalidone and Aldactone in the morning and take the Atenolol in the evening

## 2019-09-05 NOTE — Progress Notes (Signed)
Primary Physician:  Christa See, FNP   Patient ID: [name] [name], female    DOB: 1941/03/13, 78 y.o.   MRN: [id number]  Subjective:    Chief Complaint  Patient presents with  . Follow-up    Diastolic Heart Failure    HPI: [name] [name]  is a 78 y.o. female  with hypertension, hyperlipidemia, COPD, OA, depression, history of breast cancer s/p lumpectomy in 2005 and radiation and chemotherapy in [id number], non-alcoholic cirrhosis of liver in 2015, COPD, former tobacco use, recently evaluated by [place] for chest pain and shortness of breath.  She underwent Lexiscan nuclear stress testing in July 2020 that was considered low risk study.  Echocardiogram revealed grade 1 diastolic dysfunction, normal LVEF.  She had advised on Wellbutrin to help with weight loss, but developed dizziness and frequent falls being medication that improved with stopping.  Although he did lose weight with Wellbutrin and has since gained back previously lost weight.  At her last office visit chlorthalidone was increased to 50 mg daily.  She now presents for 61-month follow-up.  She continues to report dyspnea on exertion.  She had previously picked up a part-time job, but states that she has been this due to inability to stand for several hours on her feet and getting tired with the job.  She is also continues to have leg edema.  She has been trying to make some diet changes to help with weight loss, but has been unsuccessful.  She continues to have intermittent episodes of dizziness, but states nothing like before when she was on Wellbutrin.  She has not had any recent falls or syncope.  She is mentioning that palpitations that are chronic for her have recently worsened and are now occurring several times a day.  States that these can last for approximately 1 hour and describes them as fluttering sensation.  She is also had episodes of chest pain at night while lying in bed.  States that this can occur with changing positions or  with reading to her bedside lamp.  States it feels like a spasm.    She has recently moved to [facility] from [place] in Nov 2019. She had normal coronary angiogram in 2008 with Dr. Radford Pax. She was previously followed by Cardiology in [place].   She is a former smoker that quit in 2005. No alcohol or drug use.    Past Medical History:  Diagnosis Date  . Aortic stenosis   . Cancer ([place])   . Chest pain   . Chronic kidney disease   . Cirrhosis ([place])   . COPD (chronic obstructive pulmonary disease) ([place])   . DJD (degenerative joint disease)   . GERD (gastroesophageal reflux disease)   . Hyperlipidemia   . Hypertension   . Leg edema   . Orthopnea   . Palpitation   . SOB (shortness of breath)   . Vitamin D deficiency      Social History   Socioeconomic History  . Marital status: Widowed    Spouse name: Not on file  . Number of children: 3  . Years of education: Not on file  . Highest education level: Not on file  Occupational History  . Not on file  Tobacco Use  . Smoking status: Former Smoker    Packs/day: 0.50    Years: 30.00    Pack years: 15.00    Types: Cigarettes    Start date: 02/12/1961    Quit date: 2005    Years since quitting: 16.0  .  Smokeless tobacco: Never Used  Substance and Sexual Activity  . Alcohol use: Never  . Drug use: Never  . Sexual activity: Not on file  Other Topics Concern  . Not on file  Social History Narrative  . Not on file   Social Determinants of Health   Financial Resource Strain:   . Difficulty of Paying Living Expenses: Not on file  Food Insecurity:   . Worried About Charity fundraiser in the Last Year: Not on file  . Ran Out of Food in the Last Year: Not on file  Transportation Needs:   . Lack of Transportation (Medical): Not on file  . Lack of Transportation (Non-Medical): Not on file  Physical Activity:   . Days of Exercise per Week: Not on file  . Minutes of Exercise per Session: Not on file  Stress:   . Feeling of Stress : Not  on file  Social Connections:   . Frequency of Communication with Friends and Family: Not on file  . Frequency of Social Gatherings with Friends and Family: Not on file  . Attends Religious Services: Not on file  . Active Member of Clubs or Organizations: Not on file  . Attends Archivist Meetings: Not on file  . Marital Status: Not on file  Intimate Partner Violence:   . Fear of Current or Ex-Partner: Not on file  . Emotionally Abused: Not on file  . Physically Abused: Not on file  . Sexually Abused: Not on file    Review of Systems  Constitution: Positive for malaise/fatigue. Negative for decreased appetite and weight loss.  Eyes: Negative for visual disturbance.  Cardiovascular: Positive for chest pain (positional), dyspnea on exertion and leg swelling. Negative for claudication, orthopnea, palpitations and syncope.  Respiratory: Negative for hemoptysis and wheezing.   Endocrine: Negative for cold intolerance and heat intolerance.  Hematologic/Lymphatic: Does not bruise/bleed easily.  Skin: Negative for nail changes.  Musculoskeletal: Negative for muscle weakness and myalgias.  Gastrointestinal: Negative for abdominal pain, change in bowel habit, nausea and vomiting.  Neurological: Positive for dizziness. Negative for difficulty with concentration, focal weakness and headaches.  Psychiatric/Behavioral: Negative for altered mental status and suicidal ideas.  All other systems reviewed and are negative.     Objective:  Blood pressure (!) 143/83, pulse (!) 103, temperature 97.6 F (36.4 C), temperature source Temporal, resp. rate 14, height 5\' 2"  (1.575 m), weight 212 lb 11.2 oz (96.5 kg), SpO2 94 %. Body mass index is 38.9 kg/m.    Physical Exam  Constitutional: She is oriented to person, place, and time. Vital signs are normal. She appears well-developed and well-nourished.  HENT:  Head: Normocephalic and atraumatic.  Cardiovascular: Normal rate, regular rhythm and  intact distal pulses.  Murmur heard.  Harsh midsystolic murmur is present with a grade of 2/6 at the upper right sternal border radiating to the neck. Pulses:      Carotid pulses are on the right side with bruit and on the left side with bruit.      Femoral pulses are 1+ on the right side and 1+ on the left side.      Popliteal pulses are 1+ on the right side and 1+ on the left side.       Dorsalis pedis pulses are 2+ on the right side and 2+ on the left side.       Posterior tibial pulses are 2+ on the right side and 2+ on the left side.  Pulses difficult  to feel due to bodily habitus 2+ pitting edema bilateral Bilateral varicosities   Pulmonary/Chest: Effort normal and breath sounds normal. No accessory muscle usage. No respiratory distress.  Abdominal: Soft. Bowel sounds are normal.  Musculoskeletal:        General: Normal range of motion.     Cervical back: Normal range of motion.  Neurological: She is alert and oriented to person, place, and time.  Skin: Skin is warm and dry.  Vitals reviewed.  Radiology: No results found.  Laboratory examination:    CMP Latest Ref Rng & Units 02/21/2019 05/15/2007 03/21/2007  Glucose 65 - 99 mg/dL 120(H) 93 180(H)  BUN 8 - 27 mg/dL 22 18 19   Creatinine 0.57 - 1.00 mg/dL 0.93 0.69 0.76  Sodium 134 - 144 mmol/L 141 140 137  Potassium 3.5 - 5.2 mmol/L 3.9 4.2 4.2  Chloride 96 - 106 mmol/L 99 107 104  CO2 20 - 29 mmol/L 25 24 22   Calcium 8.7 - 10.3 mg/dL 9.6 9.6 9.3  Total Protein 6.0 - 8.3 g/dL - 6.6 6.5  Total Bilirubin 0.3 - 1.2 mg/dL - 0.6 0.7  Alkaline Phos 39 - 117 U/L - 47 76  AST 0 - 37 U/L - 18 37  ALT 0 - 35 U/L - 26 63(H)   CBC Latest Ref Rng & Units 05/15/2007 03/21/2007 01/19/2007  WBC 3.9 - 10.0 10e3/uL 7.5 5.4 7.3  Hemoglobin 11.6 - 15.9 g/dL 13.4 13.4 12.4  Hematocrit 34.8 - 46.6 % 37.1 38.1 35.9(L)  Platelets 145 - 400 10e3/uL 211 231 193   Lipid Panel  No results found for: CHOL, TRIG, HDL, CHOLHDL, VLDL, LDLCALC,  LDLDIRECT HEMOGLOBIN A1C No results found for: HGBA1C, MPG TSH No results for input(s): TSH in the last 8760 hours.  PRN Meds:. Medications Discontinued During This Encounter  Medication Reason  . Melatonin 5 MG CAPS Error   Current Meds  Medication Sig  . amitriptyline (ELAVIL) 25 MG tablet Take 2 tablets (50 mg total) by mouth at bedtime.  Marland Kitchen atenolol (TENORMIN) 25 MG tablet Take 1 tablet by mouth daily.  . chlorthalidone (HYGROTON) 50 MG tablet Take 1 tablet (50 mg total) by mouth daily.  Marland Kitchen loratadine (CLARITIN) 10 MG tablet Take 10 mg by mouth daily.  . Multiple Minerals-Vitamins (CAL-MAG-ZINC-D) TABS Take by mouth.  . naproxen sodium (ALEVE) 220 MG tablet Take 220 mg by mouth.  . vitamin C (ASCORBIC ACID) 500 MG tablet Take 500 mg by mouth daily.    Cardiac Studies:   Carotid artery duplex  03/19/2019: No hemodynamically significant stenosis noted in bilateral internal carotid arteries. Minimal heterogeneous plaque noted in the left ICA.   The right CCA velocity is minimally elevated but no significant plaque noted. Antegrade right vertebral artery flow. Antegrade left vertebral artery flow.  Lexiscan Myoview stress test 03/12/2019: Lexiscan stress test was performed. Stress EKG is non-diagnostic, as this is pharmacological stress test. Normal myocardial perfusion, with mild uniform breast tissue attenuation in inferior myocardium. LVEF 53%. Low risk study.   Echocardiogram 03/19/2019 :   1. Normal LV systolic function with EF 64%. Left ventricle cavity is normal in size. Normal global wall motion. Doppler evidence of grade I (impaired) diastolic dysfunction, elevated LAP. Calculational diastolic dysfunction may not be accurate due to presence of mitral annular calcification. Calculated EF 64%. 2. Left atrial cavity is mildly dilated at 3.9 cm. 3. Mild calcification of the aortic valve annulus. Moderate aortic valve leaflet calcification, mostly of the non coronary cusp with  mildly restricted aortic valve leaflets. Mild aortic valve stenosis. Aortic valve peak pressure gradient of 20 and mean gradient of 11.5 mmHg, calculated aortic valve area 1.16 cm. No aortic valve regurgitation noted. 4. Mild mitral valve leaflet thickening with mild calcification. Trace mitral regurgitation. 5. Mild eccenteric tricuspid regurgitation. Inadequate TR jet to estimate pulmonary artery systolic pressure. 6. IVC is not well visualized.  Cath 01/06/2011:  1. The left main coronary is widely patent and bifurcates in the left      anterior descending artery and left circumflex artery.  2. The left anterior descending artery is widely patent throughout its      course of the apex.  It gives rise to a first diagonal which is      rather large and bifurcates into two daughter vessels both of which      are widely patent.  The ongoing LAD gives rise to a second diagonal      which is widely patent.  3. The left circumflex is widely patent throughout its course in the      AV groove.  It gives rise to a first small obtuse marginal one      branch which is widely patent and then gives rise to a second      larger obtuse marginal branch too which bifurcates into two      daughter vessels and is widely patent.  4. The right coronary is widely patent throughout its course and      distally bifurcates into the posterior descending artery and      posterior lateral artery both of which are widely patent.  5. Left ventriculography shows normal LV function, EF 60%, LVEDP 10-16      mmHg.  LV pressure 148/70 mmHg, aortic pressure was 155/81 mmHg.  6. Right heart cath data showed oxygen saturations in the right atrium      67%, RV 66%, PA 66%.  Cardiac output by Fick 3.8, cardiac index by      Fick 2.0, right atrial pressure 12/9 with a mean of 8 mmHg, right      ventricular pressure 35/80 with a mean of 13 mmHg.  PA pressure      36/17 with a mean of 27 mmHg.  Pulmonary capillary wedge  pressure      23/23 with a mean of 17 mmHg.  Assessment:   Chronic diastolic heart failure (HCC) - Plan: EKG 12-Lead  Palpitations - Plan: Cardiac event monitor  Bilateral leg edema - Plan: Basic metabolic panel  Primary hypertension  Morbid obesity (HCC)  Mild aortic stenosis  EKG 09/05/2019: Sinus tachycardia at 100 bpm, left atrial abnormality, left axis deviation, anteroseptal infarct old. IVCD. No evidence of ischemia. Abnormal EKG.   Recommendations:   Patient is here for 36-month follow-up for chronic diastolic heart failure.  Unfortunately she continues to have dyspnea on exertion and leg edema.  She continues to have difficulty with losing weight and is actually gained in the past since seen by me.  Continue to feel that her symptoms are multifactorial from obesity with hypoventilation and also diastolic dysfunction.  Her blood pressure is also elevated today, I will add Aldactone 25 mg daily.  Will check BMP in 2 weeks for surveillance of kidney function.  Continue with chlorthalidone and atenolol.  She has had some worsening palpitations over the last few weeks that can last for 1 hour.  She is high risk for atrial fibrillation.  I will place her on 2-week  event monitor for further evaluation.  She is also reporting positional chest pain occurring at night while lying in bed.  Her symptoms are suggestive of musculoskeletal etiology.  She has had low risk stress test earlier this year.  Do not feel that she needs further cardiac evaluation at this point.  Encouraged her to use heat and ice to her chest wall to see if this will help.  I will plan to see her back in 3 weeks for close follow-up to discuss event monitor results and to follow-up on hypertension.  Lengthy discussion with the patient regarding the importance of continued efforts towards weight loss.  We discussed diet modifications.    Miquel Dunn, MSN, APRN, FNP-C [facility]. [place] Office:  [phone] Fax: [phone]

## 2019-09-21 ENCOUNTER — Telehealth: Payer: Self-pay | Admitting: Hematology and Oncology

## 2019-09-21 NOTE — Telephone Encounter (Signed)
Received a new pt referral from Dr. Belva Bertin at [place] at [facility] for dx of pancytopenia. [name] [name] has been cld and scheduled to see Dr. Lorenso Courier on 1/20 at 1pm. Pt aware to arrive 15 minutes early.

## 2019-09-24 ENCOUNTER — Other Ambulatory Visit: Payer: Self-pay | Admitting: Cardiology

## 2019-09-24 NOTE — Progress Notes (Signed)
[place] Telephone:([phone]) [phone]   Fax:([phone]) [place] NOTE  Patient Care Team: Christa See, FNP as PCP - General (Family Medicine)  Hematological/Oncological History #Thrombocyopenia 1) 05/15/2007: WBC 7.5, Hgb 13.4, Plt 211. MCV 95.1.  2) 09/05/2019: WBC 7.2, Hgb 15.5, Plt 141 (nml 150-400). MCV 93.7.  3) 09/26/19: Establish care with Dr. Lorenso Courier   #Breast Cancer 1)  Left partial mastectomy with needle localization and specimen mammogram, left axillary lymph node dissection/radiation 2005 2) chemotherapy administered 2006 (regimen unclear)  CHIEF COMPLAINTS/PURPOSE OF CONSULTATION:  Thrombocytopenia  HISTORY OF PRESENTING ILLNESS:  [name] [name] 79 y.o. female with medical history significant for CKD, COPD, cirrhosis, GERD, HTN and HLD who presents for evaluation of thrombocytopenia. [name] [name] was referred by Sadie Haber at [facility].   On review of the previous records [name] [name] has a history of breast cancer treated here at the [facility].  She underwent a left partial mastectomy with needle localization and specimen mammogram as well as left axillary lymph node dissection in 2005.  She was treated with radiation therapy and then subsequently underwent chemotherapy in 2006.  Unfortunately we do not have the records of the chemotherapy regimen administered at that time, and the patient does not recall.  More recently the patient was seen by her primary care provider after moving here from [place].  She was found to have a white blood cell count of 7.2 hemoglobin 15.5 and platelets of 141.  Due to concern for this thrombocytopenia the patient was referred to hematology for further evaluation and management.  On exam today [name] [name] notes that she feels well.  She reports that when she moved from [place] she stopped getting her yearly checkups for breast cancer.  She reports that she gets a yearly mammogram and the last one was performed in  November 2019.  She notes that she has had no other major health issues in the interim.  She reports that she has had no weight loss no bruising, and no dark stools.  She reports that she has no clear sites of bleeding, but that when she nicks herself she does have a good amount of bleeding is easily controlled with pressure.  She does endorse having previously been on oxygen and was recently restarted on Metformin for her type 2 diabetes.  She denies having any other issues including fevers, chills, sweats, nausea, vomiting, diarrhea.  She denies having any lumps or bumps in her breast or on her lymph nodes.  Full 10 point ROS is listed below.  MEDICAL HISTORY:  Past Medical History:  Diagnosis Date  . Aortic stenosis   . Cancer ([place])   . Chest pain   . Chronic kidney disease   . Cirrhosis ([place])   . COPD (chronic obstructive pulmonary disease) ([place])   . DJD (degenerative joint disease)   . GERD (gastroesophageal reflux disease)   . Hyperlipidemia   . Hypertension   . Leg edema   . Orthopnea   . Palpitation   . SOB (shortness of breath)   . Vitamin D deficiency     SURGICAL HISTORY: Past Surgical History:  Procedure Laterality Date  . BREAST LUMPECTOMY Left   . CARDIAC CATHETERIZATION    . CORONARY ANGIOPLASTY    . LAPAROSCOPIC TOTAL HYSTERECTOMY      SOCIAL HISTORY: Social History   Socioeconomic History  . Marital status: Widowed    Spouse name: Not on file  . Number of children: 3  .  Years of education: Not on file  . Highest education level: Not on file  Occupational History  . Not on file  Tobacco Use  . Smoking status: Former Smoker    Packs/day: 0.50    Years: 30.00    Pack years: 15.00    Types: Cigarettes    Start date: 02/12/1961    Quit date: 2005    Years since quitting: 16.0  . Smokeless tobacco: Never Used  Substance and Sexual Activity  . Alcohol use: Never  . Drug use: Never  . Sexual activity: Not on file  Other Topics Concern  . Not on file    Social History Narrative  . Not on file   Social Determinants of Health   Financial Resource Strain:   . Difficulty of Paying Living Expenses: Not on file  Food Insecurity:   . Worried About Charity fundraiser in the Last Year: Not on file  . Ran Out of Food in the Last Year: Not on file  Transportation Needs:   . Lack of Transportation (Medical): Not on file  . Lack of Transportation (Non-Medical): Not on file  Physical Activity:   . Days of Exercise per Week: Not on file  . Minutes of Exercise per Session: Not on file  Stress:   . Feeling of Stress : Not on file  Social Connections:   . Frequency of Communication with Friends and Family: Not on file  . Frequency of Social Gatherings with Friends and Family: Not on file  . Attends Religious Services: Not on file  . Active Member of Clubs or Organizations: Not on file  . Attends Archivist Meetings: Not on file  . Marital Status: Not on file  Intimate Partner Violence:   . Fear of Current or Ex-Partner: Not on file  . Emotionally Abused: Not on file  . Physically Abused: Not on file  . Sexually Abused: Not on file    FAMILY HISTORY: Family History  Problem Relation Age of Onset  . Heart attack Mother   . Stroke Mother   . Cardiomyopathy Father     ALLERGIES:  has no allergies on file.  MEDICATIONS:  Current Outpatient Medications  Medication Sig Dispense Refill  . metFORMIN (GLUCOPHAGE) 500 MG tablet Take by mouth 2 (two) times daily with a meal.    . amitriptyline (ELAVIL) 25 MG tablet Take 2 tablets (50 mg total) by mouth at bedtime. 180 tablet 1  . atenolol (TENORMIN) 25 MG tablet Take 1 tablet by mouth daily.    . chlorthalidone (HYGROTON) 50 MG tablet Take 1 tablet (50 mg total) by mouth daily. 90 tablet 1  . loratadine (CLARITIN) 10 MG tablet Take 10 mg by mouth daily.    . Multiple Minerals-Vitamins (CAL-MAG-ZINC-D) TABS Take by mouth.    . naproxen sodium (ALEVE) 220 MG tablet Take 220 mg by  mouth.    . spironolactone (ALDACTONE) 25 MG tablet Take 1 tablet (25 mg total) by mouth daily. 30 tablet 2  . vitamin C (ASCORBIC ACID) 500 MG tablet Take 500 mg by mouth daily.     No current facility-administered medications for this visit.    REVIEW OF SYSTEMS:   Constitutional: ( - ) fevers, ( - )  chills , ( - ) night sweats Eyes: ( - ) blurriness of vision, ( - ) double vision, ( - ) watery eyes Ears, nose, mouth, throat, and face: ( - ) mucositis, ( - ) sore throat Respiratory: ( - )  cough, ( + ) dyspnea, ( - ) wheezes Cardiovascular: ( - ) palpitation, ( - ) chest discomfort, ( - ) lower extremity swelling Gastrointestinal:  ( - ) nausea, ( - ) heartburn, ( - ) change in bowel habits Skin: ( - ) abnormal skin rashes Lymphatics: ( - ) new lymphadenopathy, ( - ) easy bruising Neurological: ( - ) numbness, ( - ) tingling, ( - ) new weaknesses Behavioral/Psych: ( - ) mood change, ( - ) new changes  All other systems were reviewed with the patient and are negative.  PHYSICAL EXAMINATION: ECOG PERFORMANCE STATUS: 0 - Asymptomatic  Vitals:   09/26/19 1331  BP: (!) 142/84  Pulse: 82  Resp: 18  Temp: 98.7 F (37.1 C)  SpO2: 94%   Filed Weights   09/26/19 1331  Weight: 213 lb (96.6 kg)    GENERAL: well appearing elderly Caucasian female in NAD  SKIN: skin color, texture, turgor are normal, no rashes or significant lesions EYES: conjunctiva are pink and non-injected, sclera clear LUNGS: clear to auscultation and percussion with normal breathing effort HEART: regular rate & rhythm and no murmurs and no lower extremity edema ABDOMEN: exam limited 2/2 to body habitus Musculoskeletal: no cyanosis of digits and no clubbing  PSYCH: alert & oriented x 3, fluent speech NEURO: no focal motor/sensory deficits  LABORATORY DATA:  I have reviewed the data as listed CBC Latest Ref Rng & Units 09/26/2019 05/15/2007 03/21/2007  WBC 4.0 - 10.5 K/uL 6.9 7.5 5.4  Hemoglobin 12.0 - 15.0 g/dL  15.1(H) 13.4 13.4  Hematocrit 36.0 - 46.0 % 43.6 37.1 38.1  Platelets 150 - 400 K/uL 144(L) 211 231    CMP Latest Ref Rng & Units 09/26/2019 02/21/2019 05/15/2007  Glucose 70 - 99 mg/dL 80 120(H) 93  BUN 8 - 23 mg/dL '10 22 18  ' Creatinine 0.44 - 1.00 mg/dL 0.70 0.93 0.69  Sodium 135 - 145 mmol/L 140 141 140  Potassium 3.5 - 5.1 mmol/L 3.9 3.9 4.2  Chloride 98 - 111 mmol/L 106 99 107  CO2 22 - 32 mmol/L '22 25 24  ' Calcium 8.9 - 10.3 mg/dL 9.3 9.6 9.6  Total Protein 6.5 - 8.1 g/dL 7.2 - 6.6  Total Bilirubin 0.3 - 1.2 mg/dL 1.3(H) - 0.6  Alkaline Phos 38 - 126 U/L 57 - 47  AST 15 - 41 U/L 31 - 18  ALT 0 - 44 U/L 18 - 26     PATHOLOGY: [facility] [place]  [address] [place], [place] [address]  Telephone [phone] or [phone] Fax [phone]   Prognostic Marker Panel   Case #: [id number]  Patient Name: TAINA, LANDRY  PID: [id number]  Pathologist:  DOB/Age 12-20-1940 (Age: 40) Gender: F  Date Taken: 06/18/2004  Date Received: 06/18/2004   CLINICAL INFORMATION   SPECIMEN OBTAINED  Breast, left, needle biopsy   Results  IMMUNOHISTOCHEMICAL AND MORPHOMETRIC ANALYSIS BY THE AUTOMATED  CELLULAR IMAGING SYSTEM (ACIS)   (The tumor is immunocytochemically stained for estrogen receptor  (6F11), progesterone receptor (GLO7564), Her 2 Neu, and Ki-67  (Mib-1), and quantitated morphometrically.)   Estrogen Receptor (Negative, <1%): 69%, POSITIVE Progesterone  Receptor (Negative, <1%): 57%, POSITIVE Proliferation Marker Ki67  by MIB-1 (Low <20%): 9% HERCEPTEST Her 2 Neu: 1+ (0.7,  NEGATIVE) (Negative, 0 and 1+, (<1.7); Borderline, 2+, (1.7-2.6);  Positive, 3+, (>2.6))  All controls stained appropriately.   Interpretation  THIS INVASIVE TUMOR IS POSITIVE FOR ESTROGEN AND PROGESTERONE  RECEPTOR EXPRESSION AND  DEMONSTRATES A FAVORABLE PROLIFERATION  RATE. THE INVASIVE TUMOR IS NEGATIVE FOR HER 2 NEU  OVER-EXPRESSION AS  MEASURED BY THE HERCEPTEST METHOD.   DATE REPORTED: 06/29/2004 Arlene L. Golden Circle, MD Electronically  Signed Out By: Mary Sella  Amendments for PROGNOSTIC INDICATORS-ACIS (06/23/2004)   BLOOD FILM: Review of the peripheral blood smear showed normal appearing white cells with neutrophils that were appropriately lobated and granulated. There was no predominance of bi-lobed or hyper-segmented neutrophils appreciated. No Dohle bodies were noted. There was no left shifting, immature forms or blasts noted. Numerous reactive lymphocytes are noted. Red cells show no anisopoikilocytosis, macrocytes , microcytes or polychromasia. There were no schistocytes, target cells, echinocytes, acanthocytes, dacrocytes, or stomatocytes.There was no rouleaux formation, nucleated red cells, or intra-cellular inclusions noted. The platelets are varied in size (normal and enlarged) with several areas of clumping.   RADIOGRAPHIC STUDIES: I have personally reviewed the radiological images as listed and agreed with the findings in the report. No results found.  ASSESSMENT & PLAN Sumaiyah Markert 79 y.o. female with medical history significant for CKD, COPD, cirrhosis, GERD, HTN and HLD who presents for evaluation of thrombocytopenia.  After review of the labs and discussion with the patient her findings are most consistent with a mild acute thrombocytopenia.  At this time her platelet count seems mildly below baseline at approximately 140.  In order to assess this today we will order a repeat CBC as well as a peripheral blood film to assess for clumping.  Nutritional etiologies could include vitamin B12 or folate deficiency and this would be supported by the numbness and tingling that she is having in her fingers and toes.  She has no other concerning symptoms and therefore I would recommend holding on hepatitis B hepatitis C serologies as well as splenic imaging.  We can consider these if her platelet count continues to trend downward.   Additionally to rule out any hematological malignancies we will order an SPEP and serum free light chain.  Overall her findings are consistent with a mild thrombocytopenia and therefore I think we can follow her in about 6 months to see the trend.  Additionally she carries a history of breast cancer which was treated here at the [facility] in 2005 and 2006.  She notes that she would like someone to follow her up for survivorship and we noted that we would be able to provide this service for her.  We will start by ordering a bilateral screening mammogram today and following her at least yearly in clinic for routine blood work and lab checks.  #Thrombocytopenia, mild --today will recheck baseline CMP and CBC. Additionally will collect a peripheral blood film --will check Vitamin B12, folate, MMA, and homocysteine --additionally will check SPEP and SFLC --consider evaluation for Hep B, Hep C at next lab check if thrombocytopenia worsens.  --no clear indication for splenic imaging or bone marrow biopsy as the thrombocytopenia is very mild at this time.  --f/u in 6 months to repeat CBC and determine trend.   #ER/PR Positive HER2 negative Breast Cancer, in remission --today will order a bilateral screening mammogram (last done Nov 2019)  --f/u in 1 years time to reassess.  Orders Placed This Encounter  Procedures  . CBC with Differential (Cancer Center Only)    Standing Status:   Future    Number of Occurrences:   1    Standing Expiration Date:   09/25/2020  . Immature Platelet Fraction    Standing Status:  Future    Number of Occurrences:   1    Standing Expiration Date:   09/25/2020  . CMP ([place] only)    Standing Status:   Future    Number of Occurrences:   1    Standing Expiration Date:   09/25/2020  . Save Smear (SS[facility])    Standing Status:   Future    Number of Occurrences:   1    Standing Expiration Date:   09/25/2020  . Vitamin B12    Standing Status:   Future      Number of Occurrences:   1    Standing Expiration Date:   09/25/2020  . Folate, Serum    Standing Status:   Future    Number of Occurrences:   1    Standing Expiration Date:   09/25/2020  . SPEP (Serum protein electrophoresis)    Standing Status:   Future    Number of Occurrences:   1    Standing Expiration Date:   09/25/2020  . Kappa/lambda light chains    Standing Status:   Future    Number of Occurrences:   1    Standing Expiration Date:   09/25/2020  . Methylmalonic acid, serum    Standing Status:   Future    Number of Occurrences:   1    Standing Expiration Date:   09/25/2020  . Homocysteine, serum    Standing Status:   Future    Number of Occurrences:   1    Standing Expiration Date:   09/25/2020    All questions were answered. The patient knows to call the clinic with any problems, questions or concerns.  A total of more than 60 minutes were spent on this encounter and over half of that time was spent on counseling and coordination of care as outlined above.   Ledell Peoples, MD Department of Hematology/Oncology [place] at [facility] Phone: [phone] Pager: [phone] Email: [email or web address]  09/26/2019 3:30 PM

## 2019-09-25 LAB — BASIC METABOLIC PANEL
BUN/Creatinine Ratio: 18 (ref 12–28)
BUN: 13 mg/dL (ref 8–27)
CO2: 26 mmol/L (ref 20–29)
Calcium: 10 mg/dL (ref 8.7–10.3)
Chloride: 101 mmol/L (ref 96–106)
Creatinine, Ser: 0.73 mg/dL (ref 0.57–1.00)
GFR calc Af Amer: 93 mL/min/{1.73_m2} (ref >59)
GFR calc non Af Amer: 80 mL/min/{1.73_m2} (ref >59)
Glucose: 95 mg/dL (ref 65–99)
Potassium: 4.5 mmol/L (ref 3.5–5.2)
Sodium: 141 mmol/L (ref 134–144)

## 2019-09-26 ENCOUNTER — Telehealth: Payer: Self-pay | Admitting: Hematology and Oncology

## 2019-09-26 ENCOUNTER — Encounter: Payer: Self-pay | Admitting: Hematology and Oncology

## 2019-09-26 ENCOUNTER — Other Ambulatory Visit: Payer: Self-pay

## 2019-09-26 ENCOUNTER — Inpatient Hospital Stay: Payer: Medicare PPO | Attending: Hematology and Oncology | Admitting: Hematology and Oncology

## 2019-09-26 ENCOUNTER — Inpatient Hospital Stay: Payer: Medicare PPO

## 2019-09-26 VITALS — BP 142/84 | HR 82 | Temp 98.7°F | Resp 18 | Ht 62.0 in | Wt 213.0 lb

## 2019-09-26 DIAGNOSIS — E1122 Type 2 diabetes mellitus with diabetic chronic kidney disease: Secondary | ICD-10-CM | POA: Diagnosis not present

## 2019-09-26 DIAGNOSIS — I129 Hypertensive chronic kidney disease with stage 1 through stage 4 chronic kidney disease, or unspecified chronic kidney disease: Secondary | ICD-10-CM | POA: Insufficient documentation

## 2019-09-26 DIAGNOSIS — N189 Chronic kidney disease, unspecified: Secondary | ICD-10-CM | POA: Diagnosis not present

## 2019-09-26 DIAGNOSIS — D696 Thrombocytopenia, unspecified: Secondary | ICD-10-CM | POA: Diagnosis present

## 2019-09-26 DIAGNOSIS — J449 Chronic obstructive pulmonary disease, unspecified: Secondary | ICD-10-CM | POA: Diagnosis not present

## 2019-09-26 DIAGNOSIS — K219 Gastro-esophageal reflux disease without esophagitis: Secondary | ICD-10-CM | POA: Insufficient documentation

## 2019-09-26 DIAGNOSIS — Z853 Personal history of malignant neoplasm of breast: Secondary | ICD-10-CM | POA: Insufficient documentation

## 2019-09-26 DIAGNOSIS — E785 Hyperlipidemia, unspecified: Secondary | ICD-10-CM | POA: Insufficient documentation

## 2019-09-26 DIAGNOSIS — K7460 Unspecified cirrhosis of liver: Secondary | ICD-10-CM | POA: Insufficient documentation

## 2019-09-26 LAB — CBC WITH DIFFERENTIAL (CANCER CENTER ONLY)
Abs Immature Granulocytes: 0.01 10*3/uL (ref 0.00–0.07)
Basophils Absolute: 0 10*3/uL (ref 0.0–0.1)
Basophils Relative: 0 %
Eosinophils Absolute: 0.2 10*3/uL (ref 0.0–0.5)
Eosinophils Relative: 3 %
HCT: 43.6 % (ref 36.0–46.0)
Hemoglobin: 15.1 g/dL — ABNORMAL HIGH (ref 12.0–15.0)
Immature Granulocytes: 0 %
Lymphocytes Relative: 32 %
Lymphs Abs: 2.2 10*3/uL (ref 0.7–4.0)
MCH: 32.1 pg (ref 26.0–34.0)
MCHC: 34.6 g/dL (ref 30.0–36.0)
MCV: 92.8 fL (ref 80.0–100.0)
Monocytes Absolute: 0.6 10*3/uL (ref 0.1–1.0)
Monocytes Relative: 9 %
Neutro Abs: 3.8 10*3/uL (ref 1.7–7.7)
Neutrophils Relative %: 56 %
Platelet Count: 144 10*3/uL — ABNORMAL LOW (ref 150–400)
RBC: 4.7 MIL/uL (ref 3.87–5.11)
RDW: 12.7 % (ref 11.5–15.5)
WBC Count: 6.9 10*3/uL (ref 4.0–10.5)
nRBC: 0 % (ref 0.0–0.2)

## 2019-09-26 LAB — CMP (CANCER CENTER ONLY)
ALT: 18 U/L (ref 0–44)
AST: 31 U/L (ref 15–41)
Albumin: 4.1 g/dL (ref 3.5–5.0)
Alkaline Phosphatase: 57 U/L (ref 38–126)
Anion gap: 12 (ref 5–15)
BUN: 10 mg/dL (ref 8–23)
CO2: 22 mmol/L (ref 22–32)
Calcium: 9.3 mg/dL (ref 8.9–10.3)
Chloride: 106 mmol/L (ref 98–111)
Creatinine: 0.7 mg/dL (ref 0.44–1.00)
GFR, Est AFR Am: >60 mL/min (ref >60)
GFR, Estimated: >60 mL/min (ref >60)
Glucose, Bld: 80 mg/dL (ref 70–99)
Potassium: 3.9 mmol/L (ref 3.5–5.1)
Sodium: 140 mmol/L (ref 135–145)
Total Bilirubin: 1.3 mg/dL — ABNORMAL HIGH (ref 0.3–1.2)
Total Protein: 7.2 g/dL (ref 6.5–8.1)

## 2019-09-26 LAB — IMMATURE PLATELET FRACTION: Immature Platelet Fraction: 2.5 % (ref 1.2–8.6)

## 2019-09-26 LAB — SAVE SMEAR(SSMR), FOR PROVIDER SLIDE REVIEW

## 2019-09-26 LAB — FOLATE: Folate: 11.7 ng/mL (ref >5.9)

## 2019-09-26 LAB — VITAMIN B12: Vitamin B-12: 279 pg/mL (ref 180–914)

## 2019-09-26 NOTE — Telephone Encounter (Signed)
Faxed records to Dr Roxan Hockey [phone]

## 2019-09-27 ENCOUNTER — Telehealth: Payer: Self-pay

## 2019-09-27 ENCOUNTER — Ambulatory Visit (INDEPENDENT_AMBULATORY_CARE_PROVIDER_SITE_OTHER): Payer: Medicare PPO | Admitting: Cardiology

## 2019-09-27 ENCOUNTER — Encounter: Payer: Self-pay | Admitting: Cardiology

## 2019-09-27 ENCOUNTER — Telehealth: Payer: Self-pay | Admitting: Hematology and Oncology

## 2019-09-27 VITALS — BP 139/75 | HR 76 | Temp 97.8°F | Ht 62.0 in | Wt 211.5 lb

## 2019-09-27 DIAGNOSIS — I5032 Chronic diastolic (congestive) heart failure: Secondary | ICD-10-CM | POA: Diagnosis not present

## 2019-09-27 DIAGNOSIS — R600 Localized edema: Secondary | ICD-10-CM | POA: Diagnosis not present

## 2019-09-27 DIAGNOSIS — I10 Essential (primary) hypertension: Secondary | ICD-10-CM

## 2019-09-27 LAB — HOMOCYSTEINE: Homocysteine: 9.9 umol/L (ref 0.0–19.2)

## 2019-09-27 LAB — PROTEIN ELECTROPHORESIS, SERUM
A/G Ratio: 1.2 (ref 0.7–1.7)
Albumin ELP: 3.6 g/dL (ref 2.9–4.4)
Alpha-1-Globulin: 0.2 g/dL (ref 0.0–0.4)
Alpha-2-Globulin: 0.6 g/dL (ref 0.4–1.0)
Beta Globulin: 1 g/dL (ref 0.7–1.3)
Gamma Globulin: 1.1 g/dL (ref 0.4–1.8)
Globulin, Total: 3 g/dL (ref 2.2–3.9)
Total Protein ELP: 6.6 g/dL (ref 6.0–8.5)

## 2019-09-27 LAB — KAPPA/LAMBDA LIGHT CHAINS
Kappa free light chain: 29.1 mg/L — ABNORMAL HIGH (ref 3.3–19.4)
Kappa, lambda light chain ratio: 1.9 — ABNORMAL HIGH (ref 0.26–1.65)
Lambda free light chains: 15.3 mg/L (ref 5.7–26.3)

## 2019-09-27 NOTE — Telephone Encounter (Signed)
Pt aware.

## 2019-09-27 NOTE — Progress Notes (Addendum)
Primary Physician:  Christa See, FNP   Patient ID: [name] [name], female    DOB: 1941/05/15, 79 y.o.   MRN: [id number]  Subjective:    Chief Complaint  Patient presents with  . Palpitations  . Congestive Heart Failure  . Hypertension  . Follow-up    event monitor    HPI: [name] [name]  is a 79 y.o. female  with hypertension, hyperlipidemia, COPD, OA, depression, history of breast cancer s/p lumpectomy in 2005 and radiation and chemotherapy in [id number], non-alcoholic cirrhosis of liver in 2015, COPD, and former tobacco use. She underwent Lexiscan nuclear stress testing in July 2020 that was considered low risk study.  Echocardiogram revealed grade 1 diastolic dysfunction, normal LVEF.    She has recently complained of increase frequency of palpitations, and was placed on 2-week event monitor now presents for follow-up.  Since last seen by me, states palpitations have improved as well as her dyspnea on exertion.  Blood pressure has improved with addition of Aldactone.  Reports that she has been started on Metformin for newly noted diabetes.  She is requesting information regarding diet changes.  She has not had any further chest pain.  She has recently moved to [facility] from [place] in Nov 2019. She had normal coronary angiogram in 2008 with Dr. Radford Pax. She was previously followed by Cardiology in [place].   She is a former smoker that quit in 2005. No alcohol or drug use.    Past Medical History:  Diagnosis Date  . Aortic stenosis   . Cancer ([place])   . Chest pain   . Chronic kidney disease   . Cirrhosis ([place])   . COPD (chronic obstructive pulmonary disease) ([place])   . DJD (degenerative joint disease)   . GERD (gastroesophageal reflux disease)   . Hyperlipidemia   . Hypertension   . Leg edema   . Orthopnea   . Palpitation   . SOB (shortness of breath)   . Vitamin D deficiency      Social History   Socioeconomic History  . Marital status: Widowed    Spouse name: Not on file  .  Number of children: 4  . Years of education: Not on file  . Highest education level: Not on file  Occupational History  . Not on file  Tobacco Use  . Smoking status: Former Smoker    Packs/day: 0.50    Years: 30.00    Pack years: 15.00    Types: Cigarettes    Start date: 02/12/1961    Quit date: 2005    Years since quitting: 16.0  . Smokeless tobacco: Never Used  Substance and Sexual Activity  . Alcohol use: Never  . Drug use: Never  . Sexual activity: Not on file  Other Topics Concern  . Not on file  Social History Narrative  . Not on file   Social Determinants of Health   Financial Resource Strain:   . Difficulty of Paying Living Expenses: Not on file  Food Insecurity:   . Worried About Charity fundraiser in the Last Year: Not on file  . Ran Out of Food in the Last Year: Not on file  Transportation Needs:   . Lack of Transportation (Medical): Not on file  . Lack of Transportation (Non-Medical): Not on file  Physical Activity:   . Days of Exercise per Week: Not on file  . Minutes of Exercise per Session: Not on file  Stress:   . Feeling of Stress : Not on  file  Social Connections:   . Frequency of Communication with Friends and Family: Not on file  . Frequency of Social Gatherings with Friends and Family: Not on file  . Attends Religious Services: Not on file  . Active Member of Clubs or Organizations: Not on file  . Attends Archivist Meetings: Not on file  . Marital Status: Not on file  Intimate Partner Violence:   . Fear of Current or Ex-Partner: Not on file  . Emotionally Abused: Not on file  . Physically Abused: Not on file  . Sexually Abused: Not on file    Review of Systems  Constitution: Negative for decreased appetite, malaise/fatigue and weight loss.  Eyes: Negative for visual disturbance.  Cardiovascular: Positive for dyspnea on exertion and leg swelling. Negative for chest pain, claudication, orthopnea, palpitations and syncope.    Respiratory: Negative for hemoptysis and wheezing.   Endocrine: Negative for cold intolerance and heat intolerance.  Hematologic/Lymphatic: Does not bruise/bleed easily.  Skin: Negative for nail changes.  Musculoskeletal: Negative for muscle weakness and myalgias.  Gastrointestinal: Negative for abdominal pain, change in bowel habit, nausea and vomiting.  Neurological: Negative for difficulty with concentration, dizziness, focal weakness and headaches.  Psychiatric/Behavioral: Negative for altered mental status and suicidal ideas.  All other systems reviewed and are negative.     Objective:  Blood pressure 139/75, pulse 76, temperature 97.8 F (36.6 C), height 5\' 2"  (1.575 m), weight 211 lb 8 oz (95.9 kg). Body mass index is 38.68 kg/m.    Physical Exam  Constitutional: She is oriented to person, place, and time. Vital signs are normal. She appears well-developed and well-nourished.  HENT:  Head: Normocephalic and atraumatic.  Cardiovascular: Normal rate, regular rhythm and intact distal pulses.  Murmur heard.  Harsh midsystolic murmur is present with a grade of 2/6 at the upper right sternal border radiating to the neck. Pulses:      Carotid pulses are on the right side with bruit and on the left side with bruit.      Femoral pulses are 1+ on the right side and 1+ on the left side.      Popliteal pulses are 1+ on the right side and 1+ on the left side.       Dorsalis pedis pulses are 2+ on the right side and 2+ on the left side.       Posterior tibial pulses are 2+ on the right side and 2+ on the left side.  Pulses difficult to feel due to bodily habitus Trace bilateral edema Bilateral varicosities   Pulmonary/Chest: Effort normal and breath sounds normal. No accessory muscle usage. No respiratory distress.  Abdominal: Soft. Bowel sounds are normal.  Musculoskeletal:        General: Normal range of motion.     Cervical back: Normal range of motion.  Neurological: She is alert  and oriented to person, place, and time.  Skin: Skin is warm and dry.  Vitals reviewed.  Radiology: No results found.  Laboratory examination:    CMP Latest Ref Rng & Units 09/26/2019 09/24/2019 02/21/2019  Glucose 70 - 99 mg/dL 80 95 120(H)  BUN 8 - 23 mg/dL 10 13 22   Creatinine 0.44 - 1.00 mg/dL 0.70 0.73 0.93  Sodium 135 - 145 mmol/L 140 141 141  Potassium 3.5 - 5.1 mmol/L 3.9 4.5 3.9  Chloride 98 - 111 mmol/L 106 101 99  CO2 22 - 32 mmol/L 22 26 25   Calcium 8.9 - 10.3 mg/dL 9.3  10.0 9.6  Total Protein 6.5 - 8.1 g/dL 7.2 - -  Total Bilirubin 0.3 - 1.2 mg/dL 1.3(H) - -  Alkaline Phos 38 - 126 U/L 57 - -  AST 15 - 41 U/L 31 - -  ALT 0 - 44 U/L 18 - -   CBC Latest Ref Rng & Units 09/26/2019 05/15/2007 03/21/2007  WBC 4.0 - 10.5 K/uL 6.9 7.5 5.4  Hemoglobin 12.0 - 15.0 g/dL 15.1(H) 13.4 13.4  Hematocrit 36.0 - 46.0 % 43.6 37.1 38.1  Platelets 150 - 400 K/uL 144(L) 211 231   Lipid Panel  No results found for: CHOL, TRIG, HDL, CHOLHDL, VLDL, LDLCALC, LDLDIRECT HEMOGLOBIN A1C No results found for: HGBA1C, MPG TSH No results for input(s): TSH in the last 8760 hours.  PRN Meds:. There are no discontinued medications. Current Meds  Medication Sig  . amitriptyline (ELAVIL) 25 MG tablet Take 2 tablets (50 mg total) by mouth at bedtime.  Marland Kitchen atenolol (TENORMIN) 25 MG tablet Take 1 tablet by mouth daily.  . calcium carbonate (OS-CAL) 600 MG tablet Take 600 mg by mouth daily.  . Cholecalciferol (VITAMIN D3) 25 MCG (1000 UT) CAPS Take 1 capsule by mouth daily.  Marland Kitchen loratadine (CLARITIN) 10 MG tablet Take 10 mg by mouth daily.  . metFORMIN (GLUCOPHAGE) 500 MG tablet Take by mouth 2 (two) times daily with a meal.  . Multiple Minerals-Vitamins (CAL-MAG-ZINC-D) TABS Take by mouth.  . naproxen sodium (ALEVE) 220 MG tablet Take 220 mg by mouth.  . spironolactone (ALDACTONE) 25 MG tablet Take 1 tablet (25 mg total) by mouth daily.    Cardiac Studies:   14 day event monitor 12/30-01/08/2020:  Normal sinus rhythm. 4 patient triggered events correlated with sinus rhythm with rare PVC and sinus tachycardia without reported symptoms. 2 auto detected events for sinus tachycardia with PVCs. 1 episode of probable SVT on Day 6 at 07:06 PM at 158 bpm, cannot exclude atrial flutter. No symptoms reported. Maximum HR 158 bpm. Average HR 85 bpm.   Carotid artery duplex  03/19/2019: No hemodynamically significant stenosis noted in bilateral internal carotid arteries. Minimal heterogeneous plaque noted in the left ICA.   The right CCA velocity is minimally elevated but no significant plaque noted. Antegrade right vertebral artery flow. Antegrade left vertebral artery flow.  Lexiscan Myoview stress test 03/12/2019: Lexiscan stress test was performed. Stress EKG is non-diagnostic, as this is pharmacological stress test. Normal myocardial perfusion, with mild uniform breast tissue attenuation in inferior myocardium. LVEF 53%. Low risk study.   Echocardiogram 03/19/2019 :   1. Normal LV systolic function with EF 64%. Left ventricle cavity is normal in size. Normal global wall motion. Doppler evidence of grade I (impaired) diastolic dysfunction, elevated LAP. Calculational diastolic dysfunction may not be accurate due to presence of mitral annular calcification. Calculated EF 64%. 2. Left atrial cavity is mildly dilated at 3.9 cm. 3. Mild calcification of the aortic valve annulus. Moderate aortic valve leaflet calcification, mostly of the non coronary cusp with mildly restricted aortic valve leaflets. Mild aortic valve stenosis. Aortic valve peak pressure gradient of 20 and mean gradient of 11.5 mmHg, calculated aortic valve area 1.16 cm. No aortic valve regurgitation noted. 4. Mild mitral valve leaflet thickening with mild calcification. Trace mitral regurgitation. 5. Mild eccenteric tricuspid regurgitation. Inadequate TR jet to estimate pulmonary artery systolic pressure. 6. IVC is not well  visualized.  Cath 01/06/2011:  1. The left main coronary is widely patent and bifurcates in the left  anterior descending artery and left circumflex artery.  2. The left anterior descending artery is widely patent throughout its      course of the apex.  It gives rise to a first diagonal which is      rather large and bifurcates into two daughter vessels both of which      are widely patent.  The ongoing LAD gives rise to a second diagonal      which is widely patent.  3. The left circumflex is widely patent throughout its course in the      AV groove.  It gives rise to a first small obtuse marginal one      branch which is widely patent and then gives rise to a second      larger obtuse marginal branch too which bifurcates into two      daughter vessels and is widely patent.  4. The right coronary is widely patent throughout its course and      distally bifurcates into the posterior descending artery and      posterior lateral artery both of which are widely patent.  5. Left ventriculography shows normal LV function, EF 60%, LVEDP 10-16      mmHg.  LV pressure 148/70 mmHg, aortic pressure was 155/81 mmHg.  6. Right heart cath data showed oxygen saturations in the right atrium      67%, RV 66%, PA 66%.  Cardiac output by Fick 3.8, cardiac index by      Fick 2.0, right atrial pressure 12/9 with a mean of 8 mmHg, right      ventricular pressure 35/80 with a mean of 13 mmHg.  PA pressure      36/17 with a mean of 27 mmHg.  Pulmonary capillary wedge pressure      23/23 with a mean of 17 mmHg.  Assessment:   Chronic diastolic heart failure (HCC)  Primary hypertension  Bilateral leg edema  Morbid obesity ([place])  EKG 09/05/2019: Sinus tachycardia at 100 bpm, left atrial abnormality, left axis deviation, anteroseptal infarct old. IVCD. No evidence of ischemia. Abnormal EKG.   Recommendations:   I discussed recently obtained event monitor results with the patient, she had rare PVCs  that correlated with her symptoms as well as occasional episodes of sinus tachycardia.  I suspect her sinus tachycardia is related to deconditioning.  Palpitations have improved since last seen by me.    Continue to feel that her dyspnea on exertion is likely related to her obesity, COPD, and chronic diastolic heart failure.  She is feeling some better since last seen by me a few weeks ago.  No clinical evidence of decompensated heart failure by clinical exam.  She has mild aortic stenosis by recent echocardiogram and do not feel that this is contributing to her symptoms.   I do suspect that she would have significant improvement in her symptoms with weight loss.  On further discussion with her, she appears to have poor dietary habits and eating higher sugar content.  I do feel that she would benefit from referral to [facility] health and wellness for help with her diet and management for weight loss.  She previously lost weight with Wellbutrin; however, had side effects of dizziness and increased falling that resolved with stopping the medication.  Blood pressure has significantly improved with Aldactone.  I have also reviewed her results that shows stable kidney function.  Continue with the present medications. Leg edema has also improved.  I will plan to  see her back in 6 months, but encouraged her to contact me sooner if needed.  Miquel Dunn, MSN, APRN, FNP-C [facility]. [place] Office: [phone] Fax: [phone]

## 2019-09-27 NOTE — Telephone Encounter (Signed)
Scheduled per los. Called and left msg. Mailed printout  °

## 2019-09-27 NOTE — Telephone Encounter (Signed)
No she can continue with it along with her other medications

## 2019-09-27 NOTE — Telephone Encounter (Signed)
Ms. Lumpkins wanted to know if she was to stop her Chlorthalidone medication

## 2019-09-29 LAB — METHYLMALONIC ACID, SERUM: Methylmalonic Acid, Quantitative: 185 nmol/L (ref 0–378)

## 2019-10-03 ENCOUNTER — Telehealth: Payer: Self-pay | Admitting: *Deleted

## 2019-10-03 NOTE — Telephone Encounter (Signed)
Notified of message below. Verbalized understanding.   Wants to know if Dr Lorenso Courier will get mammogram arranged

## 2019-10-03 NOTE — Telephone Encounter (Signed)
-----   Message from Otila Kluver, RN sent at 10/03/2019  3:50 PM EST -----  ----- Message ----- From: Orson Slick, MD Sent: 10/03/2019  11:51 AM EST To: Otila Kluver, RN  Please call Mrs. Hugger to let her know her Platelet count is 144, just a little below normal of 150. Our labs showed no nutritional abnormalities, but there was some clumping of the platelets which may give a falsely low platelet count (the machine reads it wrong). There is nothing to be concerned about at this time. We will have her back in 6 months to recheck.  Colan Neptune  ----- Message ----- From: Buel Ream, [facility] [place] Sent: 09/26/2019   2:38 PM EST To: Orson Slick, MD

## 2019-11-19 ENCOUNTER — Other Ambulatory Visit: Payer: Self-pay

## 2019-11-19 DIAGNOSIS — I10 Essential (primary) hypertension: Secondary | ICD-10-CM

## 2019-11-19 MED ORDER — ATENOLOL 25 MG PO TABS: 25.0000 mg | ORAL_TABLET | Freq: Every day | ORAL | 3 refills | Status: DC

## 2019-12-12 ENCOUNTER — Other Ambulatory Visit: Payer: Self-pay | Admitting: Cardiology

## 2019-12-12 ENCOUNTER — Other Ambulatory Visit: Payer: Self-pay

## 2019-12-12 MED ORDER — CHLORTHALIDONE 50 MG PO TABS: 50.0000 mg | ORAL_TABLET | Freq: Every day | ORAL | 6 refills | Status: DC

## 2019-12-12 MED ORDER — METFORMIN HCL 500 MG PO TABS: 500.0000 mg | ORAL_TABLET | Freq: Two times a day (BID) | ORAL | 6 refills | Status: DC

## 2019-12-12 MED ORDER — SPIRONOLACTONE 25 MG PO TABS: 25.0000 mg | ORAL_TABLET | Freq: Every day | ORAL | 6 refills | Status: DC

## 2019-12-13 LAB — BASIC METABOLIC PANEL
BUN/Creatinine Ratio: 22 (ref 12–28)
BUN: 23 mg/dL (ref 8–27)
CO2: 22 mmol/L (ref 20–29)
Calcium: 10.9 mg/dL — ABNORMAL HIGH (ref 8.7–10.3)
Chloride: 98 mmol/L (ref 96–106)
Creatinine, Ser: 1.04 mg/dL — ABNORMAL HIGH (ref 0.57–1.00)
GFR calc Af Amer: 60 mL/min/{1.73_m2} (ref >59)
GFR calc non Af Amer: 52 mL/min/{1.73_m2} — ABNORMAL LOW (ref >59)
Glucose: 138 mg/dL — ABNORMAL HIGH (ref 65–99)
Potassium: 3.9 mmol/L (ref 3.5–5.2)
Sodium: 141 mmol/L (ref 134–144)

## 2019-12-17 ENCOUNTER — Telehealth: Payer: Self-pay

## 2019-12-17 NOTE — Telephone Encounter (Signed)
Patient called requesting lab results. Returned call, unable to leave vm.

## 2019-12-18 NOTE — Progress Notes (Signed)
23

## 2020-03-04 ENCOUNTER — Ambulatory Visit: Payer: Medicare PPO | Admitting: Dietician

## 2020-03-23 ENCOUNTER — Other Ambulatory Visit: Payer: Self-pay | Admitting: Hematology and Oncology

## 2020-03-23 DIAGNOSIS — D696 Thrombocytopenia, unspecified: Secondary | ICD-10-CM

## 2020-03-24 ENCOUNTER — Inpatient Hospital Stay: Payer: Medicare PPO | Attending: Hematology and Oncology | Admitting: Hematology and Oncology

## 2020-03-24 ENCOUNTER — Other Ambulatory Visit: Payer: Self-pay | Admitting: Hematology and Oncology

## 2020-03-24 ENCOUNTER — Inpatient Hospital Stay: Payer: Medicare PPO

## 2020-03-24 DIAGNOSIS — D696 Thrombocytopenia, unspecified: Secondary | ICD-10-CM

## 2020-03-25 ENCOUNTER — Ambulatory Visit: Payer: Medicare PPO | Admitting: Cardiology

## 2020-03-26 ENCOUNTER — Ambulatory Visit: Payer: Medicare PPO | Admitting: Cardiology

## 2020-04-02 ENCOUNTER — Ambulatory Visit: Payer: Medicare PPO | Admitting: Cardiology

## 2020-04-11 ENCOUNTER — Ambulatory Visit: Payer: Medicare PPO | Admitting: Cardiology

## 2020-04-14 ENCOUNTER — Encounter: Payer: Self-pay | Admitting: Registered"

## 2020-04-14 ENCOUNTER — Encounter: Payer: Medicare PPO | Attending: Family Medicine | Admitting: Registered"

## 2020-04-14 ENCOUNTER — Other Ambulatory Visit: Payer: Self-pay

## 2020-04-14 DIAGNOSIS — E1165 Type 2 diabetes mellitus with hyperglycemia: Secondary | ICD-10-CM | POA: Insufficient documentation

## 2020-04-14 NOTE — Progress Notes (Signed)
Diabetes Self-Management Education  Visit Type: First/Initial  Appt. Start Time: 1410 Appt. End Time: 1520  04/18/2020  [name], identified by name and date of birth, is a 79 y.o. female with a diagnosis of Diabetes: Type 2.   ASSESSMENT  There were no vitals taken for this visit. There is no height or weight on file to calculate BMI.   Patient states she was first diagnosed about 5 yrs ago when she was living in [place]. Pt states she lost weight and didn't have diabetes anymore and was taken off metformin.   Pt reports she has a stationary bike she likes to ride, but hasn't been able to do more than 2 min due to SOB related to COPD. Pt states one of her medication makes her dizzy and in Dec fell as approaching her porch.  Pt reports it has been about 3 years since her last dental appointment. She had a partial done on top teeth.  Stress: Pt reports she returned to [place] 2 yrs ago to help her mother who passed away soon after she moved back. Pt states she has difficulty sleep but does not take anything for it.   Diabetes Self-Management Education - 04/18/20 [id number]      Visit Information   Visit Type First/Initial      Initial Visit   Diabetes Type Type 2    Are you currently following a meal plan? No   metformin 500 BID   Are you taking your medications as prescribed? Yes    Date Diagnosed about 5 yrs ago      Health Coping   How would you rate your overall health? Fair      Psychosocial Assessment   Patient Belief/Attitude about Diabetes Motivated to manage diabetes    How often do you need to have someone help you when you read instructions, pamphlets, or other written materials from your doctor or pharmacy? 1 - Never    What is the last grade level you completed in school? high school      Complications   Last HgB A1C per patient/outside source 7.7 %    How often do you check your blood sugar? 0 times/day (not testing)    Have you had a dilated eye exam in the past  12 months? No    Have you had a dental exam in the past 12 months? No    Are you checking your feet? No      Dietary Intake   Breakfast eggs, sausage, hamburger bun    Lunch lean cuisine noodles and broccoli    Dinner baked chicken with bbq sauce, with salad or sweet potato    Snack (evening) about 1/2 cup ice cream    Beverage(s) water      Exercise   Exercise Type ADL's    How many days per week to you exercise? 0    How many minutes per day do you exercise? 0    Total minutes per week of exercise 0      Patient Education   Previous Diabetes Education No    Nutrition management  Role of diet in the treatment of diabetes and the relationship between the three main macronutrients and blood glucose level    Physical activity and exercise  Role of exercise on diabetes management, blood pressure control and cardiac health.    Medications Reviewed patients medication for diabetes, action, purpose, timing of dose and side effects.    Psychosocial adjustment Role of  stress on diabetes;Other (comment)   sleep     Outcomes   Expected Outcomes Demonstrated interest in learning. Expect positive outcomes    Future DMSE 4-6 wks    Program Status Not Completed           Individualized Plan for Diabetes Self-Management Training:   Learning Objective:  Patient will have a greater understanding of diabetes self-management. Patient education plan is to attend individual and/or group sessions per assessed needs and concerns.   Patient Instructions  Consider slowly increasing your physical activity as tolerated. 2 min on your stationary bike is a good start and do several times per day. Read through the Sleep Hygiene handout for tips to get better sleep. Aim to eat balanced meals and snacks Return for follow-up appointment to discuss carbohydrates and dietary guidelines in more detail. Continue taking medications as prescribed by your MD    Expected Outcomes:  Demonstrated interest in  learning. Expect positive outcomes  Education material provided: Sleep Hygiene, Planning Healthy Meals  If problems or questions, patient to contact team via:  Phone and MyChart  Future DSME appointment: 4-6 wks

## 2020-04-18 DIAGNOSIS — E1165 Type 2 diabetes mellitus with hyperglycemia: Secondary | ICD-10-CM | POA: Insufficient documentation

## 2020-04-18 NOTE — Patient Instructions (Signed)
Consider slowly increasing your physical activity as tolerated. 2 min on your stationary bike is a good start and do several times per day. Read through the Sleep Hygiene handout for tips to get better sleep. Aim to eat balanced meals and snacks Return for follow-up appointment to discuss carbohydrates and dietary guidelines in more detail. Continue taking medications as prescribed by your MD

## 2020-05-06 ENCOUNTER — Ambulatory Visit: Payer: Medicare PPO | Admitting: Cardiology

## 2020-05-06 ENCOUNTER — Encounter: Payer: Self-pay | Admitting: Cardiology

## 2020-05-06 ENCOUNTER — Other Ambulatory Visit: Payer: Self-pay

## 2020-05-06 VITALS — BP 143/79 | HR 90 | Resp 16 | Ht 62.0 in | Wt 201.0 lb

## 2020-05-06 DIAGNOSIS — R0609 Other forms of dyspnea: Secondary | ICD-10-CM

## 2020-05-06 DIAGNOSIS — Z853 Personal history of malignant neoplasm of breast: Secondary | ICD-10-CM | POA: Diagnosis not present

## 2020-05-06 DIAGNOSIS — J449 Chronic obstructive pulmonary disease, unspecified: Secondary | ICD-10-CM

## 2020-05-06 DIAGNOSIS — R0600 Dyspnea, unspecified: Secondary | ICD-10-CM

## 2020-05-06 DIAGNOSIS — K7460 Unspecified cirrhosis of liver: Secondary | ICD-10-CM

## 2020-05-06 DIAGNOSIS — I10 Essential (primary) hypertension: Secondary | ICD-10-CM | POA: Diagnosis not present

## 2020-05-06 DIAGNOSIS — I5032 Chronic diastolic (congestive) heart failure: Secondary | ICD-10-CM

## 2020-05-06 DIAGNOSIS — Z87891 Personal history of nicotine dependence: Secondary | ICD-10-CM | POA: Diagnosis not present

## 2020-05-06 DIAGNOSIS — I350 Nonrheumatic aortic (valve) stenosis: Secondary | ICD-10-CM

## 2020-05-06 MED ORDER — METOPROLOL SUCCINATE ER 25 MG PO TB24: 25.0000 mg | ORAL_TABLET | Freq: Every day | ORAL | 0 refills | Status: DC

## 2020-05-06 MED ORDER — CHLORTHALIDONE 25 MG PO TABS: 25.0000 mg | ORAL_TABLET | Freq: Every morning | ORAL | 0 refills | Status: DC

## 2020-05-06 MED ORDER — SPIRONOLACTONE 25 MG PO TABS: 25.0000 mg | ORAL_TABLET | Freq: Every morning | ORAL | 0 refills | Status: DC

## 2020-05-06 NOTE — Progress Notes (Signed)
[name] [name] Date of Birth: October 19, 1940 MRN: [id number] Primary Care Provider:Christa See, FNP Former Cardiology Providers: Jeri Lager, APRN, FNP-C Primary Cardiologist: Rex Kras, DO, [facility] (established care 05/06/2020)  Date: 05/06/20 Last Visit: 09/27/2019  Chief Complaint  Patient presents with  . Congestive Heart Failure  . Follow-up    6 month    HPI  [name] [name] is a 79 y.o.  female who presents to the office with a chief complaint of " heart failure management." Patient's past medical history and cardiovascular risk factors include: hypertension, hyperlipidemia, HFpEF/stage B/ NYHA II, COPD, OA, depression, history of breast cancer s/p lumpectomy in 2005 and radiation and chemotherapy in 5284, non-alcoholic cirrhosis of liver in 2015, and former tobacco use.  Patient was formally under the care of [name] [name] was last seen in the office in January 2021 and now is here for 71-month follow-up for management of congestive heart failure.  Patient has history of chronic heart failure with preserved EF.  Patient states that her dyspnea on exertion remains stable.  She usually experiences the symptoms when she overexerts herself, patient states that she is compliant with medical therapy, no hospitalizations or urgent care visits for cardiovascular symptoms.  Patient has lost approximately 10 pounds the last office visit.  Since last visit patient states that her spironolactone was discontinued because she was having symptoms of lightheadedness.  No recent labs were reviewed.  FUNCTIONAL STATUS: No structured exercise program or daily routine.   ALLERGIES: No Known Allergies   MEDICATION LIST PRIOR TO VISIT: Current Outpatient Medications on File Prior to Visit  Medication Sig Dispense Refill  . Acetaminophen-Caffeine (EXCEDRIN TENSION HEADACHE) 500-65 MG TABS Take by mouth. 250 mg - 65mg     . amitriptyline (ELAVIL) 25 MG tablet Take 2 tablets (50 mg total) by mouth at bedtime.  180 tablet 1  . calcium carbonate (OS-CAL) 600 MG tablet Take 600 mg by mouth daily.    . Cholecalciferol (VITAMIN D3) 25 MCG (1000 UT) CAPS Take 1 capsule by mouth daily.    Marland Kitchen dimenhyDRINATE (DRAMAMINE) 50 MG tablet Take 50 mg by mouth every 8 (eight) hours as needed.    . metFORMIN (GLUCOPHAGE) 500 MG tablet Take 1 tablet (500 mg total) by mouth 2 (two) times daily with a meal. 60 tablet 6  . Multiple Minerals-Vitamins (CAL-MAG-ZINC-D) TABS Take by mouth.    . naproxen sodium (ALEVE) 220 MG tablet Take 220 mg by mouth.     No current facility-administered medications on file prior to visit.    PAST MEDICAL HISTORY: Past Medical History:  Diagnosis Date  . Aortic stenosis   . Cancer ([place])   . Chest pain   . Chronic kidney disease   . Cirrhosis ([place])   . COPD (chronic obstructive pulmonary disease) ([place])   . DJD (degenerative joint disease)   . GERD (gastroesophageal reflux disease)   . Hyperlipidemia   . Hypertension   . Leg edema   . Orthopnea   . Palpitation   . SOB (shortness of breath)   . Vitamin D deficiency     PAST SURGICAL HISTORY: Past Surgical History:  Procedure Laterality Date  . BREAST LUMPECTOMY Left   . CARDIAC CATHETERIZATION    . CORONARY ANGIOPLASTY    . LAPAROSCOPIC TOTAL HYSTERECTOMY      FAMILY HISTORY: The patient's family history includes Cardiomyopathy in her father; Heart attack in her mother; Stroke in her mother.   SOCIAL HISTORY:  The patient  reports that she quit smoking  about 16 years ago. Her smoking use included cigarettes. She started smoking about 59 years ago. She has a 15.00 pack-year smoking history. She has never used smokeless tobacco. She reports that she does not drink alcohol and does not use drugs.  Review of Systems  Constitutional: Positive for weight loss. Negative for chills and fever.  HENT: Negative for hoarse voice and nosebleeds.   Eyes: Negative for discharge, double vision and pain.  Cardiovascular: Positive for  dyspnea on exertion (chronic and stable). Negative for chest pain, claudication, leg swelling, near-syncope, orthopnea, palpitations, paroxysmal nocturnal dyspnea and syncope.  Respiratory: Negative for hemoptysis and shortness of breath.   Musculoskeletal: Negative for muscle cramps and myalgias.  Gastrointestinal: Negative for abdominal pain, constipation, diarrhea, hematemesis, hematochezia, melena, nausea and vomiting.  Neurological: Negative for dizziness and light-headedness.    PHYSICAL EXAM: Vitals with BMI 05/06/2020 09/27/2019 09/26/2019  Height 5\' 2"  5\' 2"  5\' 2"   Weight 201 lbs 211 lbs 8 oz 213 lbs  BMI 36.75 61.95 09.32  Systolic 671 245 809  Diastolic 79 75 84  Pulse 90 76 82   CONSTITUTIONAL: Appears older than stated age, well-nourished. No acute distress.  SKIN: Skin is warm and dry. No rash noted. No cyanosis. No pallor. No jaundice HEAD: Normocephalic and atraumatic.  EYES: No scleral icterus MOUTH/THROAT: Moist oral membranes.  NECK: No JVD present. No thyromegaly noted. No carotid bruits  LYMPHATIC: No visible cervical adenopathy.  CHEST Normal respiratory effort. No intercostal retractions  LUNGS: Clear to auscultation bilaterally.  No stridor. No wheezes. No rales.  CARDIOVASCULAR: Regular rate and rhythm, positive X8-P3, soft holosystolic murmur at the apex, no gallops or rubs. ABDOMINAL: Obese, soft, nontender, nondistended, positive bowel sounds all 4 quadrants, no apparent ascites.  EXTREMITIES: No peripheral edema  HEMATOLOGIC: No significant bruising NEUROLOGIC: Oriented to person, place, and time. Nonfocal. Normal muscle tone.  PSYCHIATRIC: Normal mood and affect. Normal behavior. Cooperative  CARDIAC DATABASE: EKG: 05/06/2020: Sinus  Rhythm, 88bpm, left axis, LAFB, old anteroseptal infarct, without underlying injury pattern.    Echocardiogram: 03/19/2019: LVEF 82%, grade 1 diastolic impairment, elevated left atrial pressure, mildly dilated left atrium,  aortic valve sclerosis, trace MR, mild TR.  Stress Testing:  Lexiscan Myoview stress test 03/12/2019: Lexiscan stress test was performed. Stress EKG is non-diagnostic, as this is pharmacological stress test. Normal myocardial perfusion, with mild uniform breast tissue attenuation in inferior myocardium. LVEF 53%. Low risk study.   Heart Catheterization: Cath 01/06/2011:  1. The left main coronary is widely patent and bifurcates in the left anterior descending artery and left circumflex artery. 2. The left anterior descending artery is widely patent throughout its course of the apex. It gives rise to a first diagonal which is rather large and bifurcates into two daughter vessels both of which are widely patent. The ongoing LAD gives rise to a second diagonal which is widely patent. 3. The left circumflex is widely patent throughout its course in the AV groove. It gives rise to a first small obtuse marginal one branch which is widely patent and then gives rise to a second larger obtuse marginal branch too which bifurcates into two daughter vessels and is widely patent. 4. The right coronary is widely patent throughout its course and distally bifurcates into the posterior descending artery and posterior lateral artery both of which are widely patent. 5. Left ventriculography shows normal LV function, EF 60%, LVEDP 10-16 mmHg. LV pressure 148/70 mmHg, aortic pressure was 155/81 mmHg. 6. Right heart cath data  showed oxygen saturations in the right atrium 67%, RV 66%, PA 66%. Cardiac output by Fick 3.8, cardiac index by Fick 2.0, right atrial pressure 12/9 with a mean of 8 mmHg, right ventricular pressure 35/80 with a mean of 13 mmHg. PA pressure 36/17 with a mean of 27 mmHg. Pulmonary capillary wedge pressure 23/23 with a mean of 17 mmHg.  Carotid artery duplex 03/19/2019: No  hemodynamically significant stenosis noted in bilateral internal carotid arteries. Minimal heterogeneous plaque noted in the left ICA.  The right CCA velocity is minimally elevated but no significant plaque noted. Antegrade right vertebral artery flow. Antegrade left vertebral artery flow.  LABORATORY DATA: CBC Latest Ref Rng & Units 09/26/2019 05/15/2007 03/21/2007  WBC 4.0 - 10.5 K/uL 6.9 7.5 5.4  Hemoglobin 12.0 - 15.0 g/dL 15.1(H) 13.4 13.4  Hematocrit 36 - 46 % 43.6 37.1 38.1  Platelets 150 - 400 K/uL 144(L) 211 231    CMP Latest Ref Rng & Units 12/12/2019 09/26/2019 09/24/2019  Glucose 65 - 99 mg/dL 138(H) 80 95  BUN 8 - 27 mg/dL 23 10 13   Creatinine 0.57 - 1.00 mg/dL 1.04(H) 0.70 0.73  Sodium 134 - 144 mmol/L 141 140 141  Potassium 3.5 - 5.2 mmol/L 3.9 3.9 4.5  Chloride 96 - 106 mmol/L 98 106 101  CO2 20 - 29 mmol/L 22 22 26   Calcium 8.7 - 10.3 mg/dL 10.9(H) 9.3 10.0  Total Protein 6.5 - 8.1 g/dL - 7.2 -  Total Bilirubin 0.3 - 1.2 mg/dL - 1.3(H) -  Alkaline Phos 38 - 126 U/L - 57 -  AST 15 - 41 U/L - 31 -  ALT 0 - 44 U/L - 18 -    Lipid Panel  No results found for: CHOL, TRIG, HDL, CHOLHDL, VLDL, LDLCALC, LDLDIRECT, LABVLDL  No results found for: HGBA1C No components found for: NTPROBNP No results found for: TSH  Cardiac Panel (last 3 results) No results for input(s): CKTOTAL, CKMB, TROPONINIHS, RELINDX in the last 72 hours.  IMPRESSION:    ICD-10-CM   1. Chronic diastolic heart failure (HCC)  I50.32 EKG 12-Lead    spironolactone (ALDACTONE) 25 MG tablet    chlorthalidone (HYGROTON) 25 MG tablet    metoprolol succinate (TOPROL XL) 25 MG 24 hr tablet  2. Mild aortic stenosis  I35.0 metoprolol succinate (TOPROL XL) 25 MG 24 hr tablet  3. Dyspnea on exertion  R06.00   4. Benign hypertension  K93 Basic metabolic panel    Magnesium    Pro b natriuretic peptide (BNP)  5. Chronic obstructive pulmonary disease, unspecified COPD type ([place])  J44.9   6. Hx of breast cancer   Z85.3   7. Non-alcoholic cirrhosis ([place])  K74.60   8. Former smoker  Z87.891      RECOMMENDATIONS: [name] [name] is a 79 y.o. female whose past medical history and cardiovascular risk factors include: hypertension, hyperlipidemia, HFpEF/stage B/ NYHA II, COPD, OA, depression, history of breast cancer s/p lumpectomy in 2005 and radiation and chemotherapy in 8182, non-alcoholic cirrhosis of liver in 2015, and former tobacco use.  Chronic heart failure with preserved EF, stage B, NYHA class II/III:  Patient symptoms of congestive heart failure are well controlled.  She is overall euvolemic.  She has lost approximately 10 pounds last office visit.  No recent hospitalizations or urgent care visits for cardiovascular symptoms.  Recommend uptitrating current medications to guideline directed medical therapy  We will change atenolol 25 mg p.o. daily to metoprolol succinate 25 mg p.o. daily  Decrease chlorthalidone to 25 mg p.o. every morning  Restart spironolactone 25 mg p.o. every morning.  Recommend blood work today to reestablish baseline and to repeat blood work in 1 week to evaluate kidney function electrolytes and the medication changes.  Educated on importance of low-salt diet and medication compliance.  Benign essential hypertension: Currently managed by primary care provider.  Former smoker: Educated on the importance of continued smoking cessation.  Patient states that he usually has a lipid profile and TSH checked with her PCP on annual basis.  Will defer management to PCP for now.  FINAL MEDICATION LIST END OF ENCOUNTER: Meds ordered this encounter  Medications  . spironolactone (ALDACTONE) 25 MG tablet    Sig: Take 1 tablet (25 mg total) by mouth in the morning.    Dispense:  90 tablet    Refill:  0  . chlorthalidone (HYGROTON) 25 MG tablet    Sig: Take 1 tablet (25 mg total) by mouth in the morning.    Dispense:  90 tablet    Refill:  0  . metoprolol succinate (TOPROL  XL) 25 MG 24 hr tablet    Sig: Take 1 tablet (25 mg total) by mouth daily.    Dispense:  90 tablet    Refill:  0    Medications Discontinued During This Encounter  Medication Reason  . vitamin C (ASCORBIC ACID) 500 MG tablet Patient Preference  . loratadine (CLARITIN) 10 MG tablet No longer needed (for PRN medications)  . chlorthalidone (HYGROTON) 50 MG tablet Dose change  . atenolol (TENORMIN) 25 MG tablet Change in therapy  . spironolactone (ALDACTONE) 25 MG tablet Reorder     Current Outpatient Medications:  .  Acetaminophen-Caffeine (EXCEDRIN TENSION HEADACHE) 500-65 MG TABS, Take by mouth. 250 mg - 65mg , Disp: , Rfl:  .  amitriptyline (ELAVIL) 25 MG tablet, Take 2 tablets (50 mg total) by mouth at bedtime., Disp: 180 tablet, Rfl: 1 .  calcium carbonate (OS-CAL) 600 MG tablet, Take 600 mg by mouth daily., Disp: , Rfl:  .  Cholecalciferol (VITAMIN D3) 25 MCG (1000 UT) CAPS, Take 1 capsule by mouth daily., Disp: , Rfl:  .  dimenhyDRINATE (DRAMAMINE) 50 MG tablet, Take 50 mg by mouth every 8 (eight) hours as needed., Disp: , Rfl:  .  metFORMIN (GLUCOPHAGE) 500 MG tablet, Take 1 tablet (500 mg total) by mouth 2 (two) times daily with a meal., Disp: 60 tablet, Rfl: 6 .  Multiple Minerals-Vitamins (CAL-MAG-ZINC-D) TABS, Take by mouth., Disp: , Rfl:  .  naproxen sodium (ALEVE) 220 MG tablet, Take 220 mg by mouth., Disp: , Rfl:  .  chlorthalidone (HYGROTON) 25 MG tablet, Take 1 tablet (25 mg total) by mouth in the morning., Disp: 90 tablet, Rfl: 0 .  metoprolol succinate (TOPROL XL) 25 MG 24 hr tablet, Take 1 tablet (25 mg total) by mouth daily., Disp: 90 tablet, Rfl: 0 .  spironolactone (ALDACTONE) 25 MG tablet, Take 1 tablet (25 mg total) by mouth in the morning., Disp: 90 tablet, Rfl: 0  Orders Placed This Encounter  Procedures  . Basic metabolic panel  . Magnesium  . Pro b natriuretic peptide (BNP)  . EKG 12-Lead   --Continue cardiac medications as reconciled in final medication  list. --Return in about 3 months (around 08/05/2020) for heart failure management.. Or sooner if needed. --Continue follow-up with your primary care physician regarding the management of your other chronic comorbid conditions.  Patient's questions and concerns were addressed to her satisfaction. She  voices understanding of the instructions provided during this encounter.   During this visit I reviewed and updated: Tobacco history  allergies medication reconciliation  medical history  surgical history  family history  social history.  This note was created using a voice recognition software as a result there may be grammatical errors inadvertently enclosed that do not reflect the nature of this encounter. Every attempt is made to correct such errors.  Rex Kras, [place], [facility]  Pager: [phone] Office: [phone]

## 2020-05-07 ENCOUNTER — Other Ambulatory Visit: Payer: Self-pay | Admitting: Cardiology

## 2020-05-07 DIAGNOSIS — I5032 Chronic diastolic (congestive) heart failure: Secondary | ICD-10-CM

## 2020-05-07 LAB — PRO B NATRIURETIC PEPTIDE: NT-Pro BNP: 150 pg/mL (ref 0–738)

## 2020-05-07 LAB — BASIC METABOLIC PANEL
BUN/Creatinine Ratio: 22 (ref 12–28)
BUN: 23 mg/dL (ref 8–27)
CO2: 27 mmol/L (ref 20–29)
Calcium: 10.6 mg/dL — ABNORMAL HIGH (ref 8.7–10.3)
Chloride: 97 mmol/L (ref 96–106)
Creatinine, Ser: 1.04 mg/dL — ABNORMAL HIGH (ref 0.57–1.00)
GFR calc Af Amer: 60 mL/min/{1.73_m2} (ref >59)
GFR calc non Af Amer: 52 mL/min/{1.73_m2} — ABNORMAL LOW (ref >59)
Glucose: 134 mg/dL — ABNORMAL HIGH (ref 65–99)
Potassium: 3.7 mmol/L (ref 3.5–5.2)
Sodium: 139 mmol/L (ref 134–144)

## 2020-05-07 LAB — MAGNESIUM: Magnesium: 1.7 mg/dL (ref 1.6–2.3)

## 2020-05-08 ENCOUNTER — Telehealth: Payer: Self-pay

## 2020-05-08 NOTE — Telephone Encounter (Signed)
-----   Message from [place], [place] sent at 05/07/2020 10:41 PM EDT ----- Results reviewed.Serum potassium and magnesium levels are within normal limits.Kidney function is relatively at baseline. Sugars are above normal limits, please follow-up with PCP.Continue current medical therapy.  Reviewed blood work in 1 week after implementing medication changes.Call if questions arise.

## 2020-05-08 NOTE — Telephone Encounter (Signed)
Called pt to inform her about her lab results. Pt understood

## 2020-05-14 DIAGNOSIS — I5032 Chronic diastolic (congestive) heart failure: Secondary | ICD-10-CM | POA: Diagnosis not present

## 2020-05-15 ENCOUNTER — Other Ambulatory Visit: Payer: Self-pay | Admitting: Cardiology

## 2020-05-15 DIAGNOSIS — I5032 Chronic diastolic (congestive) heart failure: Secondary | ICD-10-CM

## 2020-05-15 LAB — BASIC METABOLIC PANEL
BUN/Creatinine Ratio: 25 (ref 12–28)
BUN: 33 mg/dL — ABNORMAL HIGH (ref 8–27)
CO2: 21 mmol/L (ref 20–29)
Calcium: 11 mg/dL — ABNORMAL HIGH (ref 8.7–10.3)
Chloride: 95 mmol/L — ABNORMAL LOW (ref 96–106)
Creatinine, Ser: 1.32 mg/dL — ABNORMAL HIGH (ref 0.57–1.00)
GFR calc Af Amer: 45 mL/min/{1.73_m2} — ABNORMAL LOW (ref >59)
GFR calc non Af Amer: 39 mL/min/{1.73_m2} — ABNORMAL LOW (ref >59)
Glucose: 153 mg/dL — ABNORMAL HIGH (ref 65–99)
Potassium: 3.9 mmol/L (ref 3.5–5.2)
Sodium: 136 mmol/L (ref 134–144)

## 2020-05-15 LAB — PRO B NATRIURETIC PEPTIDE: NT-Pro BNP: 54 pg/mL (ref 0–738)

## 2020-05-15 LAB — MAGNESIUM: Magnesium: 1.7 mg/dL (ref 1.6–2.3)

## 2020-05-19 ENCOUNTER — Telehealth: Payer: Self-pay | Admitting: Hematology and Oncology

## 2020-05-19 NOTE — Telephone Encounter (Signed)
Called pt per 9/13 sch msg - no answer on both numbers and unable to leave a message on either numbers

## 2020-05-21 DIAGNOSIS — I5032 Chronic diastolic (congestive) heart failure: Secondary | ICD-10-CM | POA: Diagnosis not present

## 2020-05-22 LAB — MAGNESIUM: Magnesium: 1.9 mg/dL (ref 1.6–2.3)

## 2020-05-22 LAB — BASIC METABOLIC PANEL
BUN/Creatinine Ratio: 23 (ref 12–28)
BUN: 20 mg/dL (ref 8–27)
CO2: 28 mmol/L (ref 20–29)
Calcium: 10.5 mg/dL — ABNORMAL HIGH (ref 8.7–10.3)
Chloride: 100 mmol/L (ref 96–106)
Creatinine, Ser: 0.86 mg/dL (ref 0.57–1.00)
GFR calc Af Amer: 75 mL/min/{1.73_m2} (ref >59)
GFR calc non Af Amer: 65 mL/min/{1.73_m2} (ref >59)
Glucose: 118 mg/dL — ABNORMAL HIGH (ref 65–99)
Potassium: 4.4 mmol/L (ref 3.5–5.2)
Sodium: 139 mmol/L (ref 134–144)

## 2020-05-26 ENCOUNTER — Encounter: Payer: Medicare PPO | Attending: Family Medicine | Admitting: Registered"

## 2020-05-26 ENCOUNTER — Encounter: Payer: Self-pay | Admitting: Registered"

## 2020-05-26 ENCOUNTER — Other Ambulatory Visit: Payer: Self-pay

## 2020-05-26 DIAGNOSIS — E1165 Type 2 diabetes mellitus with hyperglycemia: Secondary | ICD-10-CM | POA: Diagnosis not present

## 2020-05-30 NOTE — Patient Instructions (Addendum)
Great job on continuing to work on increasing your exercise duration. Continue with exercise as tolerated, 3-4x/day for 2 min each time, increasing time as you are able Continue reducing sweets and increasing vegetables Continue watching your salt intake Continue to keep your medical appointments10

## 2020-05-30 NOTE — Progress Notes (Signed)
Diabetes Self-Management Education  Visit Type: Follow-up  Appt. Start Time: 1050 Appt. End Time: 1120  05/30/2020  [name], identified by name and date of birth, is a 79 y.o. female with a diagnosis of Diabetes: Type 2.   ASSESSMENT  Height 5\' 2"  (1.575 m), weight 197 lb (89.4 kg). Body mass index is 36.03 kg/m.  Patient states since last visit she has started snacking on broccoli and radishes instead of sweets and increased fruit intake.  Pt reports her daughter and family will be moving here from [facility] and stay with patient until they find a place. Pt states she enjoys her grandchildren.  Patient states she was having pain with checking blood sugar. RD provided a new lancing device and taught her how to change the depth setting.  Pt states she gets on her bike for 2 min after eating. Pt reports she also gets some walking in using her walker using the parking lot at school.  PMH: CHF. Per chart patient lost 10 lbs from her visit 1/21 - 8/31 (201 lbs) Pt continues to gradually loose weight.   Diabetes Self-Management Education - 05/30/20 1408      Visit Information   Visit Type Follow-up      Initial Visit   Diabetes Type Type 2      Complications   How often do you check your blood sugar? 3-4 times / week    Fasting Blood glucose range (mg/dL) 130-179;180-200   138 & 183   Postprandial Blood glucose range (mg/dL) 70-129;130-179   124-142     Exercise   Exercise Type ADL's      Outcomes   Expected Outcomes Demonstrated interest in learning. Expect positive outcomes    Future DMSE PRN    Program Status Not Completed      Subsequent Visit   Since your last visit have you continued or begun to take your medications as prescribed? Yes    Since your last visit, are you checking your blood glucose at least once a day? No           Individualized Plan for Diabetes Self-Management Training:   Learning Objective:  Patient will have a greater understanding of  diabetes self-management. Patient education plan is to attend individual and/or group sessions per assessed needs and concerns.    Patient Instructions  Doristine Devoid job on continuing to work on increasing your exercise duration. Continue with exercise as tolerated, 3-4x/day for 2 min each time, increasing time as you are able Continue reducing sweets and increasing vegetables Continue watching your salt intake Continue to keep your medical appointments10   Expected Outcomes:  Demonstrated interest in learning. Expect positive outcomes  Education material provided: none  If problems or questions, patient to contact team via:  Phone and MyChart  Future DSME appointment: PRN

## 2020-06-26 DIAGNOSIS — Z0000 Encounter for general adult medical examination without abnormal findings: Secondary | ICD-10-CM | POA: Diagnosis not present

## 2020-06-26 DIAGNOSIS — Z23 Encounter for immunization: Secondary | ICD-10-CM | POA: Diagnosis not present

## 2020-06-26 DIAGNOSIS — Z1389 Encounter for screening for other disorder: Secondary | ICD-10-CM | POA: Diagnosis not present

## 2020-07-08 DIAGNOSIS — R059 Cough, unspecified: Secondary | ICD-10-CM | POA: Diagnosis not present

## 2020-07-08 DIAGNOSIS — Z03818 Encounter for observation for suspected exposure to other biological agents ruled out: Secondary | ICD-10-CM | POA: Diagnosis not present

## 2020-07-08 DIAGNOSIS — R0602 Shortness of breath: Secondary | ICD-10-CM | POA: Diagnosis not present

## 2020-07-17 ENCOUNTER — Other Ambulatory Visit: Payer: Self-pay | Admitting: Family Medicine

## 2020-07-17 DIAGNOSIS — Z1231 Encounter for screening mammogram for malignant neoplasm of breast: Secondary | ICD-10-CM

## 2020-08-04 ENCOUNTER — Encounter: Payer: Self-pay | Admitting: Cardiology

## 2020-08-04 ENCOUNTER — Telehealth: Payer: Self-pay | Admitting: Hematology and Oncology

## 2020-08-04 ENCOUNTER — Ambulatory Visit: Payer: Medicare PPO | Admitting: Cardiology

## 2020-08-04 ENCOUNTER — Other Ambulatory Visit: Payer: Self-pay

## 2020-08-04 VITALS — BP 130/71 | HR 86 | Ht 62.0 in | Wt 191.0 lb

## 2020-08-04 DIAGNOSIS — I5032 Chronic diastolic (congestive) heart failure: Secondary | ICD-10-CM | POA: Diagnosis not present

## 2020-08-04 DIAGNOSIS — K7460 Unspecified cirrhosis of liver: Secondary | ICD-10-CM | POA: Diagnosis not present

## 2020-08-04 DIAGNOSIS — I10 Essential (primary) hypertension: Secondary | ICD-10-CM | POA: Diagnosis not present

## 2020-08-04 DIAGNOSIS — J449 Chronic obstructive pulmonary disease, unspecified: Secondary | ICD-10-CM

## 2020-08-04 DIAGNOSIS — R0600 Dyspnea, unspecified: Secondary | ICD-10-CM

## 2020-08-04 DIAGNOSIS — Z853 Personal history of malignant neoplasm of breast: Secondary | ICD-10-CM

## 2020-08-04 DIAGNOSIS — R0609 Other forms of dyspnea: Secondary | ICD-10-CM | POA: Diagnosis not present

## 2020-08-04 DIAGNOSIS — Z87891 Personal history of nicotine dependence: Secondary | ICD-10-CM

## 2020-08-04 NOTE — Progress Notes (Signed)
[name] [name] Date of Birth: [birth year] MRN: [id number] Primary Care Provider:Christa See, FNP Former Cardiology Providers: Jeri Lager, APRN, FNP-C Primary Cardiologist: Rex Kras, DO, [facility] (established care 05/06/2020)  Date: 08/04/20 Last Office Visit: 05/06/2020  Chief Complaint  Patient presents with  . Heart Failure  . Follow-up    HPI  [name] [name] is a 79 y.o.  female who presents to the office with a chief complaint of " heart failure management." Patient's past medical history and cardiovascular risk factors include: hypertension, hyperlipidemia, HFpEF/stage B/ NYHA II, COPD, OA, depression, history of breast cancer s/p lumpectomy in 2005 and radiation and chemotherapy in 7078, non-alcoholic cirrhosis of liver in 2015, and former tobacco use.  Patient is being followed at the office for management of her heart failure with preserved EF. She was formally under the care of Miquel Dunn and I started seeing her as August 2021.   At the last office visit we reduced her dose of chlorthalidone and initiated spironolactone.  Also she was transitioned from atenolol to Toprol-XL.  She tolerated the medication changes well and her blood work has remained stable.  I independently reviewed the last BMP and magnesium levels with her at today's office visit.  Patient states that she is doing well from a cardiovascular standpoint.  No hospitalizations or urgent care visits for the last office encounter.  However, clinically patient appears to be more dyspneic on evaluation.  Patient states that she is taking her medications as prescribed.  She had an episode of bronchitis a month ago as she is recovering from that slowly.  She also has underlying COPD and not on regular inhaler therapy which may also be contributing to her symptoms as well.  Clinically she appears to be euvolemic.  She has her yearly physical next week with her PCP.  FUNCTIONAL STATUS: No structured exercise program or daily  routine.   ALLERGIES: No Known Allergies   MEDICATION LIST PRIOR TO VISIT: Current Outpatient Medications on File Prior to Visit  Medication Sig Dispense Refill  . Acetaminophen-Caffeine (EXCEDRIN TENSION HEADACHE) 500-65 MG TABS Take by mouth. 250 mg - 65mg     . amitriptyline (ELAVIL) 25 MG tablet Take 2 tablets (50 mg total) by mouth at bedtime. 180 tablet 1  . calcium carbonate (OS-CAL) 600 MG tablet Take 600 mg by mouth daily.    . chlorthalidone (HYGROTON) 25 MG tablet Take 1 tablet (25 mg total) by mouth in the morning. 90 tablet 0  . Cholecalciferol (D-3-5) 125 MCG (5000 UT) capsule Take 10,000 Units by mouth daily.    Marland Kitchen dimenhyDRINATE (DRAMAMINE) 50 MG tablet Take 50 mg by mouth every 8 (eight) hours as needed.    . Melatonin 10 MG TABS Take 1 tablet by mouth at bedtime.    . metFORMIN (GLUCOPHAGE) 500 MG tablet Take 1 tablet (500 mg total) by mouth 2 (two) times daily with a meal. 60 tablet 6  . metoprolol succinate (TOPROL XL) 25 MG 24 hr tablet Take 1 tablet (25 mg total) by mouth daily. 90 tablet 0  . Multiple Minerals-Vitamins (CAL-MAG-ZINC-D) TABS Take by mouth.    . Multiple Vitamins-Minerals (ONE-A-DAY WOMENS 50+ ADVANTAGE PO) Take by mouth.    . spironolactone (ALDACTONE) 25 MG tablet Take 1 tablet (25 mg total) by mouth in the morning. 90 tablet 0   No current facility-administered medications on file prior to visit.    PAST MEDICAL HISTORY: Past Medical History:  Diagnosis Date  . Aortic stenosis   . Cancer (  HCC)   . Chest pain   . Chronic kidney disease   . Cirrhosis ([employer])   . COPD (chronic obstructive pulmonary disease) ([place])   . DJD (degenerative joint disease)   . GERD (gastroesophageal reflux disease)   . Hyperlipidemia   . Hypertension   . Leg edema   . Orthopnea   . Palpitation   . SOB (shortness of breath)   . Vitamin D deficiency     PAST SURGICAL HISTORY: Past Surgical History:  Procedure Laterality Date  . BREAST LUMPECTOMY Left   .  CARDIAC CATHETERIZATION    . CORONARY ANGIOPLASTY    . LAPAROSCOPIC TOTAL HYSTERECTOMY      FAMILY HISTORY: The patient's family history includes Cardiomyopathy in her father; Heart attack in her mother; Stroke in her mother.   SOCIAL HISTORY:  The patient  reports that she quit smoking about 16 years ago. Her smoking use included cigarettes. She started smoking about 59 years ago. She has a 15.00 pack-year smoking history. She has never used smokeless tobacco. She reports that she does not drink alcohol and does not use drugs.  Review of Systems  Constitutional: Positive for weight loss. Negative for chills and fever.  HENT: Negative for hoarse voice and nosebleeds.   Eyes: Negative for discharge, double vision and pain.  Cardiovascular: Positive for dyspnea on exertion (chronic and stable). Negative for chest pain, claudication, leg swelling, near-syncope, orthopnea, palpitations, paroxysmal nocturnal dyspnea and syncope.  Respiratory: Positive for snoring. Negative for hemoptysis and shortness of breath.   Musculoskeletal: Positive for back pain and joint pain. Negative for muscle cramps and myalgias.  Gastrointestinal: Negative for abdominal pain, constipation, diarrhea, hematemesis, hematochezia, melena, nausea and vomiting.  Neurological: Negative for dizziness and light-headedness.    PHYSICAL EXAM: Vitals with BMI 08/04/2020 05/26/2020 05/06/2020  Height 5\' 2"  5\' 2"  5\' 2"   Weight 191 lbs 197 lbs 201 lbs  BMI 34.93 54.00 86.76  Systolic 195 - 093  Diastolic 71 - 79  Pulse 86 - 90   CONSTITUTIONAL: Appears older than stated age, well-nourished. No acute distress.  SKIN: Skin is warm and dry. No rash noted. No cyanosis. No pallor. No jaundice HEAD: Normocephalic and atraumatic.  EYES: No scleral icterus MOUTH/THROAT: Moist oral membranes.  NECK: No JVD present. No thyromegaly noted. No carotid bruits  LYMPHATIC: No visible cervical adenopathy.  CHEST Normal respiratory  effort. No intercostal retractions  LUNGS: Clear to auscultation bilaterally.  No stridor. No wheezes. No rales.  CARDIOVASCULAR: Regular rate and rhythm, positive O6-Z1, soft holosystolic murmur at the apex, no gallops or rubs. ABDOMINAL: Obese, soft, nontender, nondistended, positive bowel sounds all 4 quadrants, no apparent ascites.  EXTREMITIES: No peripheral edema  HEMATOLOGIC: No significant bruising NEUROLOGIC: Oriented to person, place, and time. Nonfocal. Normal muscle tone.  PSYCHIATRIC: Normal mood and affect. Normal behavior. Cooperative  CARDIAC DATABASE: EKG: 05/06/2020: Sinus  Rhythm, 88bpm, left axis, LAFB, old anteroseptal infarct, without underlying injury pattern.    Echocardiogram: 03/19/2019: LVEF 24%, grade 1 diastolic impairment, elevated left atrial pressure, mildly dilated left atrium, aortic valve sclerosis, trace MR, mild TR.  Stress Testing:  Lexiscan Myoview stress test 03/12/2019: Lexiscan stress test was performed. Stress EKG is non-diagnostic, as this is pharmacological stress test. Normal myocardial perfusion, with mild uniform breast tissue attenuation in inferior myocardium. LVEF 53%. Low risk study.   Heart Catheterization: Cath 01/06/2011:  1. The left main coronary is widely patent and bifurcates in the left anterior descending artery and left circumflex  artery. 2. The left anterior descending artery is widely patent throughout its course of the apex. It gives rise to a first diagonal which is rather large and bifurcates into two daughter vessels both of which are widely patent. The ongoing LAD gives rise to a second diagonal which is widely patent. 3. The left circumflex is widely patent throughout its course in the AV groove. It gives rise to a first small obtuse marginal one branch which is widely patent and then gives rise to a second larger obtuse marginal branch too which bifurcates into  two daughter vessels and is widely patent. 4. The right coronary is widely patent throughout its course and distally bifurcates into the posterior descending artery and posterior lateral artery both of which are widely patent. 5. Left ventriculography shows normal LV function, EF 60%, LVEDP 10-16 mmHg. LV pressure 148/70 mmHg, aortic pressure was 155/81 mmHg. 6. Right heart cath data showed oxygen saturations in the right atrium 67%, RV 66%, PA 66%. Cardiac output by Fick 3.8, cardiac index by Fick 2.0, right atrial pressure 12/9 with a mean of 8 mmHg, right ventricular pressure 35/80 with a mean of 13 mmHg. PA pressure 36/17 with a mean of 27 mmHg. Pulmonary capillary wedge pressure 23/23 with a mean of 17 mmHg.  Carotid artery duplex 03/19/2019: No hemodynamically significant stenosis noted in bilateral internal carotid arteries. Minimal heterogeneous plaque noted in the left ICA.  The right CCA velocity is minimally elevated but no significant plaque noted. Antegrade right vertebral artery flow. Antegrade left vertebral artery flow.  LABORATORY DATA: CBC Latest Ref Rng & Units 09/26/2019 05/15/2007 03/21/2007  WBC 4.0 - 10.5 K/uL 6.9 7.5 5.4  Hemoglobin 12.0 - 15.0 g/dL 15.1(H) 13.4 13.4  Hematocrit 36 - 46 % 43.6 37.1 38.1  Platelets 150 - 400 K/uL 144(L) 211 231    CMP Latest Ref Rng & Units 05/21/2020 05/14/2020 05/06/2020  Glucose 65 - 99 mg/dL 118(H) 153(H) 134(H)  BUN 8 - 27 mg/dL 20 33(H) 23  Creatinine 0.57 - 1.00 mg/dL 0.86 1.32(H) 1.04(H)  Sodium 134 - 144 mmol/L 139 136 139  Potassium 3.5 - 5.2 mmol/L 4.4 3.9 3.7  Chloride 96 - 106 mmol/L 100 95(L) 97  CO2 20 - 29 mmol/L 28 21 27   Calcium 8.7 - 10.3 mg/dL 10.5(H) 11.0(H) 10.6(H)  Total Protein 6.5 - 8.1 g/dL - - -  Total Bilirubin 0.3 - 1.2 mg/dL - - -  Alkaline Phos 38 - 126 U/L - - -  AST 15 - 41 U/L - - -  ALT 0 - 44 U/L - - -    Lipid Panel  No results found  for: CHOL, TRIG, HDL, CHOLHDL, VLDL, LDLCALC, LDLDIRECT, LABVLDL  No results found for: HGBA1C No components found for: NTPROBNP No results found for: TSH  Cardiac Panel (last 3 results) No results for input(s): CKTOTAL, CKMB, TROPONINIHS, RELINDX in the last 72 hours.  IMPRESSION:    ICD-10-CM   1. Chronic diastolic heart failure (HCC)  I50.32   2. Dyspnea on exertion  R06.00 PCV ECHOCARDIOGRAM COMPLETE    Basic metabolic panel    Magnesium    Pro b natriuretic peptide (BNP)  3. Benign hypertension  I10   4. Chronic obstructive pulmonary disease, unspecified COPD type ([place])  J44.9   5. Non-alcoholic cirrhosis ([place])  K74.60   6. Hx of breast cancer  Z85.3   7. Former smoker  Z87.891      RECOMMENDATIONS: Mayukha Symmonds is a 79 y.o.  female whose past medical history and cardiovascular risk factors include: hypertension, hyperlipidemia, HFpEF/stage B/ NYHA II, COPD, OA, depression, history of breast cancer s/p lumpectomy in 2005 and radiation and chemotherapy in 6378, non-alcoholic cirrhosis of liver in 2015, and former tobacco use.  Chronic heart failure with preserved EF, stage B, NYHA class II/III:  Clinically patient appears to be dyspneic but on physical examination she is euvolemic.  I suspect that her component of shortness of breath may be due to her recent bronchitis, underlying COPD, undiagnosed sleep apnea, and possible heart failure exacerbation.    Would like to check an echocardiogram to reevaluate LV function and valvular heart disease.    Continue current medical therapy.  Check BMP, BNP, magnesium.  Benign essential hypertension: Currently managed by primary care provider.  Former smoker: Educated on the importance of continued smoking cessation.  Patient states that he usually has a lipid profile and TSH checked with her PCP on annual basis.  Will defer management to PCP for now.  I also recommended her to request a sleep referral to be evaluated for sleep  apnea.  FINAL MEDICATION LIST END OF ENCOUNTER: No orders of the defined types were placed in this encounter.   Current Outpatient Medications:  .  Acetaminophen-Caffeine (EXCEDRIN TENSION HEADACHE) 500-65 MG TABS, Take by mouth. 250 mg - 65mg , Disp: , Rfl:  .  amitriptyline (ELAVIL) 25 MG tablet, Take 2 tablets (50 mg total) by mouth at bedtime., Disp: 180 tablet, Rfl: 1 .  calcium carbonate (OS-CAL) 600 MG tablet, Take 600 mg by mouth daily., Disp: , Rfl:  .  chlorthalidone (HYGROTON) 25 MG tablet, Take 1 tablet (25 mg total) by mouth in the morning., Disp: 90 tablet, Rfl: 0 .  Cholecalciferol (D-3-5) 125 MCG (5000 UT) capsule, Take 10,000 Units by mouth daily., Disp: , Rfl:  .  dimenhyDRINATE (DRAMAMINE) 50 MG tablet, Take 50 mg by mouth every 8 (eight) hours as needed., Disp: , Rfl:  .  Melatonin 10 MG TABS, Take 1 tablet by mouth at bedtime., Disp: , Rfl:  .  metFORMIN (GLUCOPHAGE) 500 MG tablet, Take 1 tablet (500 mg total) by mouth 2 (two) times daily with a meal., Disp: 60 tablet, Rfl: 6 .  metoprolol succinate (TOPROL XL) 25 MG 24 hr tablet, Take 1 tablet (25 mg total) by mouth daily., Disp: 90 tablet, Rfl: 0 .  Multiple Minerals-Vitamins (CAL-MAG-ZINC-D) TABS, Take by mouth., Disp: , Rfl:  .  Multiple Vitamins-Minerals (ONE-A-DAY WOMENS 50+ ADVANTAGE PO), Take by mouth., Disp: , Rfl:  .  spironolactone (ALDACTONE) 25 MG tablet, Take 1 tablet (25 mg total) by mouth in the morning., Disp: 90 tablet, Rfl: 0  Orders Placed This Encounter  Procedures  . Basic metabolic panel  . Magnesium  . Pro b natriuretic peptide (BNP)  . PCV ECHOCARDIOGRAM COMPLETE   --Continue cardiac medications as reconciled in final medication list. --Return in about 3 months (around 11/03/2020) for Dyspnea, heart failure management.. Or sooner if needed. --Continue follow-up with your primary care physician regarding the management of your other chronic comorbid conditions.  Patient's questions and  concerns were addressed to her satisfaction. She voices understanding of the instructions provided during this encounter.   This note was created using a voice recognition software as a result there may be grammatical errors inadvertently enclosed that do not reflect the nature of this encounter. Every attempt is made to correct such errors.  Time spent: 35 minutes.  Karolyn Messing Terri Skains, DO, [facility]  Pager:  [phone] Office: [phone]

## 2020-08-04 NOTE — Telephone Encounter (Signed)
Called pt per 11/24 sch msg - no answer. Left message for patient to call back to reschedule missed appt.

## 2020-08-05 ENCOUNTER — Ambulatory Visit: Payer: Medicare PPO | Admitting: Cardiology

## 2020-08-05 DIAGNOSIS — R0600 Dyspnea, unspecified: Secondary | ICD-10-CM | POA: Diagnosis not present

## 2020-08-06 ENCOUNTER — Other Ambulatory Visit: Payer: Self-pay | Admitting: Cardiology

## 2020-08-06 LAB — BASIC METABOLIC PANEL
BUN/Creatinine Ratio: 27 (ref 12–28)
BUN: 25 mg/dL (ref 8–27)
CO2: 22 mmol/L (ref 20–29)
Calcium: 11.7 mg/dL — ABNORMAL HIGH (ref 8.7–10.3)
Chloride: 100 mmol/L (ref 96–106)
Creatinine, Ser: 0.94 mg/dL (ref 0.57–1.00)
GFR calc Af Amer: 68 mL/min/{1.73_m2} (ref >59)
GFR calc non Af Amer: 59 mL/min/{1.73_m2} — ABNORMAL LOW (ref >59)
Glucose: 127 mg/dL — ABNORMAL HIGH (ref 65–99)
Potassium: 4.1 mmol/L (ref 3.5–5.2)
Sodium: 139 mmol/L (ref 134–144)

## 2020-08-06 LAB — MAGNESIUM: Magnesium: 1.7 mg/dL (ref 1.6–2.3)

## 2020-08-06 LAB — PRO B NATRIURETIC PEPTIDE: NT-Pro BNP: 70 pg/mL (ref 0–738)

## 2020-08-12 ENCOUNTER — Other Ambulatory Visit: Payer: Self-pay

## 2020-08-12 ENCOUNTER — Ambulatory Visit
Admission: RE | Admit: 2020-08-12 | Discharge: 2020-08-12 | Disposition: A | Discharge status: Home / Self Care | Payer: Medicare PPO | Source: Ambulatory Visit | Attending: Family Medicine | Admitting: Family Medicine

## 2020-08-12 DIAGNOSIS — Z1231 Encounter for screening mammogram for malignant neoplasm of breast: Secondary | ICD-10-CM | POA: Diagnosis not present

## 2020-08-12 HISTORY — DX: Malignant neoplasm of unspecified site of unspecified female breast: C50.919

## 2020-08-12 HISTORY — DX: Personal history of irradiation: Z92.3

## 2020-08-20 ENCOUNTER — Other Ambulatory Visit: Payer: Self-pay | Admitting: Cardiology

## 2020-08-20 DIAGNOSIS — I5032 Chronic diastolic (congestive) heart failure: Secondary | ICD-10-CM

## 2020-09-07 ENCOUNTER — Other Ambulatory Visit: Payer: Self-pay | Admitting: Cardiology

## 2020-09-12 ENCOUNTER — Ambulatory Visit: Payer: Medicare PPO

## 2020-10-20 ENCOUNTER — Other Ambulatory Visit: Payer: Self-pay | Admitting: Cardiology

## 2020-10-22 ENCOUNTER — Ambulatory Visit: Payer: Medicare PPO

## 2020-10-22 ENCOUNTER — Other Ambulatory Visit: Payer: Self-pay

## 2020-10-22 DIAGNOSIS — R0609 Other forms of dyspnea: Secondary | ICD-10-CM

## 2020-10-22 DIAGNOSIS — R0600 Dyspnea, unspecified: Secondary | ICD-10-CM

## 2020-10-27 NOTE — Progress Notes (Signed)
2 attempt patient didn't answer left a vm

## 2020-10-27 NOTE — Progress Notes (Signed)
Called patient, NA, LMAM

## 2020-10-28 ENCOUNTER — Other Ambulatory Visit: Payer: Self-pay | Admitting: Cardiology

## 2020-10-28 NOTE — Progress Notes (Signed)
3rd attempt : Called patient, na, LMAM.

## 2020-11-04 ENCOUNTER — Other Ambulatory Visit: Payer: Self-pay

## 2020-11-04 ENCOUNTER — Ambulatory Visit: Payer: Medicare PPO | Admitting: Cardiology

## 2020-11-04 ENCOUNTER — Encounter: Payer: Self-pay | Admitting: Cardiology

## 2020-11-04 VITALS — BP 134/85 | HR 97 | Temp 98.0°F | Ht 62.0 in | Wt 199.0 lb

## 2020-11-04 DIAGNOSIS — Z853 Personal history of malignant neoplasm of breast: Secondary | ICD-10-CM | POA: Diagnosis not present

## 2020-11-04 DIAGNOSIS — I350 Nonrheumatic aortic (valve) stenosis: Secondary | ICD-10-CM | POA: Diagnosis not present

## 2020-11-04 DIAGNOSIS — R0600 Dyspnea, unspecified: Secondary | ICD-10-CM

## 2020-11-04 DIAGNOSIS — I5032 Chronic diastolic (congestive) heart failure: Secondary | ICD-10-CM

## 2020-11-04 DIAGNOSIS — Z1211 Encounter for screening for malignant neoplasm of colon: Secondary | ICD-10-CM | POA: Diagnosis not present

## 2020-11-04 DIAGNOSIS — Z87448 Personal history of other diseases of urinary system: Secondary | ICD-10-CM | POA: Diagnosis not present

## 2020-11-04 DIAGNOSIS — J449 Chronic obstructive pulmonary disease, unspecified: Secondary | ICD-10-CM | POA: Diagnosis not present

## 2020-11-04 DIAGNOSIS — R002 Palpitations: Secondary | ICD-10-CM

## 2020-11-04 DIAGNOSIS — I10 Essential (primary) hypertension: Secondary | ICD-10-CM | POA: Diagnosis not present

## 2020-11-04 DIAGNOSIS — R0609 Other forms of dyspnea: Secondary | ICD-10-CM | POA: Diagnosis not present

## 2020-11-04 DIAGNOSIS — I493 Ventricular premature depolarization: Secondary | ICD-10-CM

## 2020-11-04 DIAGNOSIS — G629 Polyneuropathy, unspecified: Secondary | ICD-10-CM | POA: Diagnosis not present

## 2020-11-04 DIAGNOSIS — E119 Type 2 diabetes mellitus without complications: Secondary | ICD-10-CM | POA: Diagnosis not present

## 2020-11-04 DIAGNOSIS — I509 Heart failure, unspecified: Secondary | ICD-10-CM | POA: Diagnosis not present

## 2020-11-04 DIAGNOSIS — Z87891 Personal history of nicotine dependence: Secondary | ICD-10-CM | POA: Diagnosis not present

## 2020-11-04 DIAGNOSIS — E669 Obesity, unspecified: Secondary | ICD-10-CM | POA: Diagnosis not present

## 2020-11-04 DIAGNOSIS — K7460 Unspecified cirrhosis of liver: Secondary | ICD-10-CM

## 2020-11-04 DIAGNOSIS — Z7984 Long term (current) use of oral hypoglycemic drugs: Secondary | ICD-10-CM | POA: Diagnosis not present

## 2020-11-04 MED ORDER — METOPROLOL SUCCINATE ER 50 MG PO TB24: 50.0000 mg | ORAL_TABLET | Freq: Every morning | ORAL | 0 refills | Status: DC
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
## 2020-11-10 ENCOUNTER — Inpatient Hospital Stay: Payer: Medicare PPO

## 2020-11-10 DIAGNOSIS — I493 Ventricular premature depolarization: Secondary | ICD-10-CM | POA: Diagnosis not present

## 2020-11-10 DIAGNOSIS — I5032 Chronic diastolic (congestive) heart failure: Secondary | ICD-10-CM | POA: Diagnosis not present

## 2020-11-10 DIAGNOSIS — R002 Palpitations: Secondary | ICD-10-CM | POA: Diagnosis not present

## 2020-11-20 DIAGNOSIS — J449 Chronic obstructive pulmonary disease, unspecified: Secondary | ICD-10-CM | POA: Diagnosis not present

## 2020-11-20 DIAGNOSIS — R0600 Dyspnea, unspecified: Secondary | ICD-10-CM | POA: Diagnosis not present

## 2020-11-21 ENCOUNTER — Other Ambulatory Visit: Payer: Self-pay | Admitting: Cardiology

## 2020-11-24 ENCOUNTER — Other Ambulatory Visit: Payer: Self-pay | Admitting: Cardiology

## 2020-11-25 ENCOUNTER — Telehealth: Payer: Self-pay

## 2020-11-25 NOTE — Telephone Encounter (Signed)
Let me know after you talk to CM and patient.

## 2020-11-25 NOTE — Telephone Encounter (Signed)
We did not increase Chlorthalidone. Her BP medications are managed by her PCP.  At the last office visit we decided to go up on metoprolol to 50mg  po qday due to her shortness of breath and PVCs.

## 2020-11-25 NOTE — Telephone Encounter (Signed)
Patient called and said that she does not remember you going up on her Chlorthalidone, she does not want to take 50, she said she will cut it in half.

## 2020-11-26 DIAGNOSIS — R002 Palpitations: Secondary | ICD-10-CM | POA: Diagnosis not present

## 2020-11-26 DIAGNOSIS — I493 Ventricular premature depolarization: Secondary | ICD-10-CM | POA: Diagnosis not present

## 2020-11-26 DIAGNOSIS — I5032 Chronic diastolic (congestive) heart failure: Secondary | ICD-10-CM | POA: Diagnosis not present
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
## 2020-12-08 ENCOUNTER — Ambulatory Visit: Payer: Medicare PPO | Admitting: Cardiology

## 2020-12-08 ENCOUNTER — Other Ambulatory Visit: Payer: Self-pay

## 2020-12-08 ENCOUNTER — Encounter: Payer: Self-pay | Admitting: Cardiology

## 2020-12-08 VITALS — BP 129/75 | HR 91 | Temp 98.2°F | Resp 16 | Ht 62.0 in | Wt 203.3 lb

## 2020-12-08 DIAGNOSIS — R0600 Dyspnea, unspecified: Secondary | ICD-10-CM

## 2020-12-08 DIAGNOSIS — I493 Ventricular premature depolarization: Secondary | ICD-10-CM | POA: Diagnosis not present

## 2020-12-08 DIAGNOSIS — I5032 Chronic diastolic (congestive) heart failure: Secondary | ICD-10-CM | POA: Diagnosis not present

## 2020-12-08 DIAGNOSIS — E119 Type 2 diabetes mellitus without complications: Secondary | ICD-10-CM

## 2020-12-08 DIAGNOSIS — J449 Chronic obstructive pulmonary disease, unspecified: Secondary | ICD-10-CM

## 2020-12-08 DIAGNOSIS — Z87891 Personal history of nicotine dependence: Secondary | ICD-10-CM

## 2020-12-08 DIAGNOSIS — Z853 Personal history of malignant neoplasm of breast: Secondary | ICD-10-CM

## 2020-12-08 DIAGNOSIS — K7460 Unspecified cirrhosis of liver: Secondary | ICD-10-CM | POA: Diagnosis not present

## 2020-12-08 DIAGNOSIS — I350 Nonrheumatic aortic (valve) stenosis: Secondary | ICD-10-CM

## 2020-12-08 DIAGNOSIS — R002 Palpitations: Secondary | ICD-10-CM

## 2020-12-08 DIAGNOSIS — I471 Supraventricular tachycardia: Secondary | ICD-10-CM

## 2020-12-08 DIAGNOSIS — R0609 Other forms of dyspnea: Secondary | ICD-10-CM

## 2020-12-08 MED ORDER — ASPIRIN EC 81 MG PO TBEC: 81.0000 mg | DELAYED_RELEASE_TABLET | Freq: Every day | ORAL | 3 refills | Status: DC

## 2020-12-08 MED ORDER — DILTIAZEM HCL ER COATED BEADS 120 MG PO CP24: 120.0000 mg | ORAL_CAPSULE | Freq: Every morning | ORAL | 0 refills | Status: DC

## 2020-12-08 MED ORDER — ROSUVASTATIN CALCIUM 10 MG PO TABS: 10.0000 mg | ORAL_TABLET | Freq: Every day | ORAL | 0 refills | Status: DC

## 2020-12-09 ENCOUNTER — Inpatient Hospital Stay (HOSPITAL_COMMUNITY): Admission: RE | Admit: 2020-12-09 | Payer: Self-pay | Source: Ambulatory Visit

## 2020-12-09 ENCOUNTER — Other Ambulatory Visit: Payer: Self-pay | Admitting: Cardiology

## 2020-12-09 DIAGNOSIS — R0600 Dyspnea, unspecified: Secondary | ICD-10-CM | POA: Diagnosis not present

## 2020-12-10 ENCOUNTER — Telehealth: Payer: Self-pay | Admitting: Cardiology

## 2020-12-10 ENCOUNTER — Other Ambulatory Visit (HOSPITAL_COMMUNITY)
Admission: RE | Admit: 2020-12-10 | Discharge: 2020-12-10 | Disposition: A | Discharge status: Home / Self Care | Payer: Medicare PPO | Source: Ambulatory Visit | Attending: Cardiology | Admitting: Cardiology

## 2020-12-10 ENCOUNTER — Other Ambulatory Visit: Payer: Self-pay | Admitting: Cardiology

## 2020-12-10 DIAGNOSIS — Z01812 Encounter for preprocedural laboratory examination: Secondary | ICD-10-CM | POA: Diagnosis not present

## 2020-12-10 DIAGNOSIS — Z20822 Contact with and (suspected) exposure to covid-19: Secondary | ICD-10-CM | POA: Diagnosis not present

## 2020-12-10 LAB — BASIC METABOLIC PANEL
BUN/Creatinine Ratio: 21 (ref 12–28)
BUN: 20 mg/dL (ref 8–27)
CO2: 24 mmol/L (ref 20–29)
Calcium: 9.7 mg/dL (ref 8.7–10.3)
Chloride: 98 mmol/L (ref 96–106)
Creatinine, Ser: 0.94 mg/dL (ref 0.57–1.00)
Glucose: 182 mg/dL — ABNORMAL HIGH (ref 65–99)
Potassium: 3.6 mmol/L (ref 3.5–5.2)
Sodium: 141 mmol/L (ref 134–144)
eGFR: 62 mL/min/{1.73_m2} (ref >59)

## 2020-12-10 LAB — LDL CHOLESTEROL, DIRECT: LDL Direct: 104 mg/dL — ABNORMAL HIGH (ref 0–99)

## 2020-12-10 LAB — PRO B NATRIURETIC PEPTIDE: NT-Pro BNP: 45 pg/mL (ref 0–738)

## 2020-12-10 LAB — LIPID PANEL WITH LDL/HDL RATIO
Cholesterol, Total: 166 mg/dL (ref 100–199)
HDL: 47 mg/dL (ref >39)
LDL Chol Calc (NIH): 97 mg/dL (ref 0–99)
LDL/HDL Ratio: 2.1 ratio (ref 0.0–3.2)
Triglycerides: 125 mg/dL (ref 0–149)
VLDL Cholesterol Cal: 22 mg/dL (ref 5–40)

## 2020-12-10 LAB — CBC
Hematocrit: 42.1 % (ref 34.0–46.6)
Hemoglobin: 14.6 g/dL (ref 11.1–15.9)
MCH: 31.9 pg (ref 26.6–33.0)
MCHC: 34.7 g/dL (ref 31.5–35.7)
MCV: 92 fL (ref 79–97)
Platelets: 127 10*3/uL — ABNORMAL LOW (ref 150–450)
RBC: 4.58 x10E6/uL (ref 3.77–5.28)
RDW: 12.2 % (ref 11.7–15.4)
WBC: 5.7 10*3/uL (ref 3.4–10.8)

## 2020-12-10 LAB — SARS CORONAVIRUS 2 (TAT 6-24 HRS): SARS Coronavirus 2: NEGATIVE

## 2020-12-10 LAB — MAGNESIUM: Magnesium: 1.5 mg/dL — ABNORMAL LOW (ref 1.6–2.3)

## 2020-12-10 MED ORDER — MAGNESIUM OXIDE 400 MG PO CAPS: 400.0000 mg | ORAL_CAPSULE | Freq: Every day | ORAL | 2 refills | Status: AC

## 2020-12-10 NOTE — Telephone Encounter (Signed)
Called the patient to inform her that her heart cath that was scheduled for 12/11/2020 will need to be rescheduled.   Patient states that her symptoms are stable and was thankful the for the update.   She knows to seek medical attention sooner by going to the ER if her symptoms worsen in intensity, frequency, and duration.   I will have the office staff reach out to her w/ rescheduling it next week.   Mechele Claude [facility]  Pager: [phone] Office: [phone] 12/10/20 5:37 PM

## 2020-12-10 NOTE — Progress Notes (Signed)
Procedure will need to be rescheduled from 4/7 to another day. Options are 4/8 Friday or 4/12 Tuesday. Caveat for 8/18 would be uncertainty of me being on call.   Thanks MJP

## 2020-12-11 ENCOUNTER — Ambulatory Visit (HOSPITAL_COMMUNITY): Admission: RE | Admit: 2020-12-11 | Payer: Medicare PPO | Source: Home / Self Care | Admitting: Cardiology

## 2020-12-11 ENCOUNTER — Encounter (HOSPITAL_COMMUNITY): Admission: RE | Payer: Self-pay | Source: Home / Self Care

## 2020-12-11 SURGERY — RIGHT/LEFT HEART CATH AND CORONARY ANGIOGRAPHY
Anesthesia: LOCAL

## 2020-12-12 ENCOUNTER — Other Ambulatory Visit: Payer: Self-pay

## 2020-12-12 DIAGNOSIS — R0600 Dyspnea, unspecified: Secondary | ICD-10-CM

## 2020-12-12 DIAGNOSIS — R0609 Other forms of dyspnea: Secondary | ICD-10-CM

## 2020-12-15 ENCOUNTER — Other Ambulatory Visit (HOSPITAL_COMMUNITY): Payer: Self-pay

## 2020-12-16 ENCOUNTER — Other Ambulatory Visit (HOSPITAL_COMMUNITY)
Admission: RE | Admit: 2020-12-16 | Discharge: 2020-12-16 | Disposition: A | Discharge status: Home / Self Care | Payer: Medicare PPO | Source: Ambulatory Visit | Attending: Cardiology | Admitting: Cardiology

## 2020-12-16 DIAGNOSIS — Z20822 Contact with and (suspected) exposure to covid-19: Secondary | ICD-10-CM | POA: Insufficient documentation

## 2020-12-16 DIAGNOSIS — Z01812 Encounter for preprocedural laboratory examination: Secondary | ICD-10-CM | POA: Insufficient documentation

## 2020-12-16 LAB — SARS CORONAVIRUS 2 (TAT 6-24 HRS): SARS Coronavirus 2: NEGATIVE

## 2020-12-18 ENCOUNTER — Other Ambulatory Visit: Payer: Self-pay

## 2020-12-18 ENCOUNTER — Encounter (HOSPITAL_COMMUNITY): Payer: Self-pay | Admitting: Cardiology

## 2020-12-18 ENCOUNTER — Encounter (HOSPITAL_COMMUNITY)
Admission: RE | Disposition: A | Discharge status: Home / Self Care | Payer: Self-pay | Source: Ambulatory Visit | Attending: Cardiology

## 2020-12-18 ENCOUNTER — Ambulatory Visit (HOSPITAL_COMMUNITY)
Admission: RE | Admit: 2020-12-18 | Discharge: 2020-12-18 | Disposition: A | Discharge status: Home / Self Care | Payer: Medicare PPO | Source: Ambulatory Visit | Attending: Cardiology | Admitting: Cardiology

## 2020-12-18 DIAGNOSIS — E119 Type 2 diabetes mellitus without complications: Secondary | ICD-10-CM | POA: Diagnosis not present

## 2020-12-18 DIAGNOSIS — R0609 Other forms of dyspnea: Secondary | ICD-10-CM

## 2020-12-18 DIAGNOSIS — I509 Heart failure, unspecified: Secondary | ICD-10-CM | POA: Diagnosis not present

## 2020-12-18 DIAGNOSIS — I350 Nonrheumatic aortic (valve) stenosis: Secondary | ICD-10-CM | POA: Insufficient documentation

## 2020-12-18 DIAGNOSIS — Z7984 Long term (current) use of oral hypoglycemic drugs: Secondary | ICD-10-CM | POA: Insufficient documentation

## 2020-12-18 DIAGNOSIS — R0600 Dyspnea, unspecified: Secondary | ICD-10-CM | POA: Insufficient documentation

## 2020-12-18 DIAGNOSIS — Z7982 Long term (current) use of aspirin: Secondary | ICD-10-CM | POA: Insufficient documentation

## 2020-12-18 DIAGNOSIS — Z87891 Personal history of nicotine dependence: Secondary | ICD-10-CM | POA: Diagnosis not present

## 2020-12-18 DIAGNOSIS — Z79899 Other long term (current) drug therapy: Secondary | ICD-10-CM | POA: Insufficient documentation

## 2020-12-18 DIAGNOSIS — I110 Hypertensive heart disease with heart failure: Secondary | ICD-10-CM | POA: Diagnosis not present

## 2020-12-18 DIAGNOSIS — J449 Chronic obstructive pulmonary disease, unspecified: Secondary | ICD-10-CM | POA: Diagnosis not present

## 2020-12-18 DIAGNOSIS — E785 Hyperlipidemia, unspecified: Secondary | ICD-10-CM | POA: Diagnosis not present

## 2020-12-18 HISTORY — PX: RIGHT/LEFT HEART CATH AND CORONARY ANGIOGRAPHY: CATH118266

## 2020-12-18 LAB — POCT I-STAT EG7
Acid-Base Excess: 4 mmol/L — ABNORMAL HIGH (ref 0.0–2.0)
Bicarbonate: 28.3 mmol/L — ABNORMAL HIGH (ref 20.0–28.0)
Calcium, Ion: 1.22 mmol/L (ref 1.15–1.40)
HCT: 35 % — ABNORMAL LOW (ref 36.0–46.0)
Hemoglobin: 11.9 g/dL — ABNORMAL LOW (ref 12.0–15.0)
O2 Saturation: 78 %
Potassium: 2.7 mmol/L — CL (ref 3.5–5.1)
Sodium: 141 mmol/L (ref 135–145)
TCO2: 30 mmol/L (ref 22–32)
pCO2, Ven: 43 mmHg — ABNORMAL LOW (ref 44.0–60.0)
pH, Ven: 7.427 (ref 7.250–7.430)
pO2, Ven: 42 mmHg (ref 32.0–45.0)

## 2020-12-18 LAB — POCT I-STAT 7, (LYTES, BLD GAS, ICA,H+H)
Acid-Base Excess: 3 mmol/L — ABNORMAL HIGH (ref 0.0–2.0)
Bicarbonate: 27.1 mmol/L (ref 20.0–28.0)
Calcium, Ion: 1.19 mmol/L (ref 1.15–1.40)
HCT: 34 % — ABNORMAL LOW (ref 36.0–46.0)
Hemoglobin: 11.6 g/dL — ABNORMAL LOW (ref 12.0–15.0)
O2 Saturation: 97 %
Potassium: 2.7 mmol/L — CL (ref 3.5–5.1)
Sodium: 142 mmol/L (ref 135–145)
TCO2: 28 mmol/L (ref 22–32)
pCO2 arterial: 40 mmHg (ref 32.0–48.0)
pH, Arterial: 7.439 (ref 7.350–7.450)
pO2, Arterial: 85 mmHg (ref 83.0–108.0)

## 2020-12-18 LAB — GLUCOSE, CAPILLARY: Glucose-Capillary: 189 mg/dL — ABNORMAL HIGH (ref 70–99)

## 2020-12-18 SURGERY — RIGHT/LEFT HEART CATH AND CORONARY ANGIOGRAPHY
Anesthesia: LOCAL

## 2020-12-18 MED ORDER — HYDRALAZINE HCL 20 MG/ML IJ SOLN: 10.0000 mg | INTRAMUSCULAR | Status: DC | PRN

## 2020-12-18 MED ORDER — ONDANSETRON HCL 4 MG/2ML IJ SOLN: 4.0000 mg | Freq: Four times a day (QID) | INTRAMUSCULAR | Status: DC | PRN

## 2020-12-18 MED ORDER — SODIUM CHLORIDE 0.9 % IV SOLN: 250.0000 mL | INTRAVENOUS | Status: DC | PRN

## 2020-12-18 MED ORDER — SODIUM CHLORIDE 0.9 % WEIGHT BASED INFUSION: 1.0000 mL/kg/h | INTRAVENOUS | Status: DC

## 2020-12-18 MED ORDER — SODIUM CHLORIDE 0.9% FLUSH: 3.0000 mL | Freq: Two times a day (BID) | INTRAVENOUS | Status: DC

## 2020-12-18 MED ORDER — LABETALOL HCL 5 MG/ML IV SOLN: 10.0000 mg | INTRAVENOUS | Status: DC | PRN

## 2020-12-18 MED ORDER — VERAPAMIL HCL 2.5 MG/ML IV SOLN
INTRAVENOUS | Status: DC | PRN
  Administered 2020-12-18: 10 mL via INTRA_ARTERIAL

## 2020-12-18 MED ORDER — FENTANYL CITRATE (PF) 100 MCG/2ML IJ SOLN
INTRAMUSCULAR | Status: DC | PRN
  Administered 2020-12-18: 50 ug via INTRAVENOUS

## 2020-12-18 MED ORDER — HEPARIN (PORCINE) IN NACL 1000-0.9 UT/500ML-% IV SOLN
INTRAVENOUS | Status: AC
  Filled 2020-12-18: qty 1000

## 2020-12-18 MED ORDER — ACETAMINOPHEN 325 MG PO TABS: 650.0000 mg | ORAL_TABLET | ORAL | Status: DC | PRN

## 2020-12-18 MED ORDER — MIDAZOLAM HCL 2 MG/2ML IJ SOLN
INTRAMUSCULAR | Status: DC | PRN
  Administered 2020-12-18: 1 mg via INTRAVENOUS

## 2020-12-18 MED ORDER — HEPARIN SODIUM (PORCINE) 1000 UNIT/ML IJ SOLN
INTRAMUSCULAR | Status: AC
  Filled 2020-12-18: qty 1

## 2020-12-18 MED ORDER — SODIUM CHLORIDE 0.9 % IV SOLN: INTRAVENOUS | Status: DC

## 2020-12-18 MED ORDER — SODIUM CHLORIDE 0.9 % WEIGHT BASED INFUSION
3.0000 mL/kg/h | INTRAVENOUS | Status: AC
  Administered 2020-12-18: 3 mL/kg/h via INTRAVENOUS

## 2020-12-18 MED ORDER — MIDAZOLAM HCL 2 MG/2ML IJ SOLN
INTRAMUSCULAR | Status: AC
  Filled 2020-12-18: qty 2

## 2020-12-18 MED ORDER — FENTANYL CITRATE (PF) 100 MCG/2ML IJ SOLN
INTRAMUSCULAR | Status: AC
  Filled 2020-12-18: qty 2

## 2020-12-18 MED ORDER — HEPARIN (PORCINE) IN NACL 1000-0.9 UT/500ML-% IV SOLN
INTRAVENOUS | Status: DC | PRN
  Administered 2020-12-18 (×2): 500 mL

## 2020-12-18 MED ORDER — SODIUM CHLORIDE 0.9% FLUSH: 3.0000 mL | INTRAVENOUS | Status: DC | PRN

## 2020-12-18 MED ORDER — HEPARIN SODIUM (PORCINE) 1000 UNIT/ML IJ SOLN
INTRAMUSCULAR | Status: DC | PRN
  Administered 2020-12-18: 4000 [IU] via INTRAVENOUS

## 2020-12-18 MED ORDER — LIDOCAINE HCL (PF) 1 % IJ SOLN
INTRAMUSCULAR | Status: DC | PRN
  Administered 2020-12-18 (×2): 2 mL

## 2020-12-18 MED ORDER — IOHEXOL 350 MG/ML SOLN
INTRAVENOUS | Status: DC | PRN
  Administered 2020-12-18: 20 mL

## 2020-12-18 MED ORDER — ASPIRIN 81 MG PO CHEW: 81.0000 mg | CHEWABLE_TABLET | ORAL | Status: DC

## 2020-12-18 MED ORDER — LIDOCAINE HCL (PF) 1 % IJ SOLN
INTRAMUSCULAR | Status: AC
  Filled 2020-12-18: qty 30

## 2020-12-18 MED ORDER — VERAPAMIL HCL 2.5 MG/ML IV SOLN
INTRAVENOUS | Status: AC
  Filled 2020-12-18: qty 2

## 2020-12-18 SURGICAL SUPPLY — 12 items
CATH OPTITORQUE TIG 4.0 5F (CATHETERS) ×2 IMPLANT
CATH SWAN GANZ 7F STRAIGHT (CATHETERS) ×1 IMPLANT
DEVICE RAD COMP TR BAND LRG (VASCULAR PRODUCTS) ×2 IMPLANT
GLIDESHEATH SLEND A-KIT 6F 22G (SHEATH) ×1 IMPLANT
GLIDESHEATH SLENDER 7FR .021G (SHEATH) ×1 IMPLANT
GUIDEWIRE .025 260CM (WIRE) ×1 IMPLANT
GUIDEWIRE INQWIRE 1.5J.035X260 (WIRE) IMPLANT
INQWIRE 1.5J .035X260CM (WIRE) ×2
KIT HEART LEFT (KITS) ×2 IMPLANT
PACK CARDIAC CATHETERIZATION (CUSTOM PROCEDURE TRAY) ×2 IMPLANT
TRANSDUCER W/STOPCOCK (MISCELLANEOUS) ×2 IMPLANT
TUBING CIL FLEX 10 FLL-RA (TUBING) ×2 IMPLANT

## 2020-12-18 NOTE — Discharge Instructions (Signed)
Radial Site Care  This sheet gives you information about how to care for yourself after your procedure. Your health care provider may also give you more specific instructions. If you have problems or questions, contact your health care provider. What can I expect after the procedure? After the procedure, it is common to have:  Bruising and tenderness at the catheter insertion area. Follow these instructions at home: Medicines  Take over-the-counter and prescription medicines only as told by your health care provider. Insertion site care 1. Follow instructions from your health care provider about how to take care of your insertion site. Make sure you: ? Wash your hands with soap and water before you remove your bandage (dressing). If soap and water are not available, use hand sanitizer. ? May remove dressing in 24 hours. 2. Check your insertion site every day for signs of infection. Check for: ? Redness, swelling, or pain. ? Fluid or blood. ? Pus or a bad smell. ? Warmth. 3. Do no take baths, swim, or use a hot tub for 5 days. 4. You may shower 24-48 hours after the procedure. ? Remove the dressing and gently wash the site with plain soap and water. ? Pat the area dry with a clean towel. ? Do not rub the site. That could cause bleeding. 5. Do not apply powder or lotion to the site. Activity  1. For 24 hours after the procedure, or as directed by your health care provider: ? Do not flex or bend the affected arm. ? Do not push or pull heavy objects with the affected arm. ? Do not drive yourself home from the hospital or clinic. You may drive 24 hours after the procedure. ? Do not operate machinery or power tools. ? KEEP ARM ELEVATED THE REMAINDER OF THE DAY. 2. Do not push, pull or lift anything that is heavier than 10 lb for 5 days. 3. Ask your health care provider when it is okay to: ? Return to work or school. ? Resume usual physical activities or sports. ? Resume sexual  activity. General instructions  If the catheter site starts to bleed, raise your arm and put firm pressure on the site. If the bleeding does not stop, get help right away. This is a medical emergency.  DRINK PLENTY OF FLUIDS FOR THE NEXT 2-3 DAYS.  No alcohol consumption for 24 hours after receiving sedation.  If you went home on the same day as your procedure, a responsible adult should be with you for the first 24 hours after you arrive home.  Keep all follow-up visits as told by your health care provider. This is important. Contact a health care provider if:  You have a fever.  You have redness, swelling, or yellow drainage around your insertion site. Get help right away if:  You have unusual pain at the radial site.  The catheter insertion area swells very fast.  The insertion area is bleeding, and the bleeding does not stop when you hold steady pressure on the area.  Your arm or hand becomes pale, cool, tingly, or numb. These symptoms may represent a serious problem that is an emergency. Do not wait to see if the symptoms will go away. Get medical help right away. Call your local emergency services (911 in the [place] Do not drive yourself to the hospital. Summary  After the procedure, it is common to have bruising and tenderness at the site.  Follow instructions from your health care provider about how to take care   of your radial site wound. Check the wound every day for signs of infection.  This information is not intended to replace advice given to you by your health care provider. Make sure you discuss any questions you have with your health care provider. Document Revised: 09/28/2017 Document Reviewed: 09/28/2017 [employer] Patient Education  2020 [employer] 

## 2020-12-18 NOTE — Interval H&P Note (Signed)
History and Physical Interval Note:  12/18/2020 7:43 AM  [name] [name]  has presented today for surgery, with the diagnosis of heart failure/DOE.  The various methods of treatment have been discussed with the patient and family. After consideration of risks, benefits and other options for treatment, the patient has consented to  Procedure(s): RIGHT/LEFT HEART CATH AND CORONARY ANGIOGRAPHY (N/A) as a surgical intervention.  The patient's history has been reviewed, patient examined, no change in status, stable for surgery.  I have reviewed the patient's chart and labs.  Questions were answered to the patient's satisfaction.    2012 Appropriate Use Criteria for Diagnostic Catheterization Home / Select Test of Interest Indication for RHC Valvular Disease Valvular Disease Symptoms Clinical Impressions of Severity Symptomatic; Concordant Valvular Disease (Right and Left Heart Catheterization or Right Heart Catheterization Alone With or  Valvular Disease  (Right and Left Heart Catheterization or Right Heart Catheterization Alone  With or Without Left Ventriculography and Coronary Angiography) Link Here: [email or web address] Indication:  Chronic Native or Prosthetic Valvular Disease Symptomatic Related to Valvular Disease Mild or moderate aortic stenosis Noninvasive Imaging for Valvular Disease Conflicting With Clinical Impression of Severity A (7) Indication: 85; Score 7   [name] [name] [name] [name]

## 2020-12-18 NOTE — Progress Notes (Signed)
Arm board applied to right arm
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
## 2020-12-18 NOTE — Progress Notes (Signed)
Discharge instructions reviewed with pt and her son both voice understanding.  

## 2020-12-23 ENCOUNTER — Other Ambulatory Visit: Payer: Self-pay | Admitting: Cardiology

## 2020-12-30 NOTE — Progress Notes (Signed)
[name] [name] Date of Birth: Aug 12, 1941 MRN: [id number] Primary Care Provider:Ayesha Rumpf, FNP Former Cardiology Providers: Altamese Aberdeen, APRN, FNP-C Primary Cardiologist: Tessa Lerner, DO, [facility] (established care 05/06/2020)  Date: 01/01/21 Last Office Visit: 12/08/2020  Chief Complaint  Patient presents with  . Post cath   . Follow-up    HPI  [name] [name] is a 80 y.o.  female who presents to the office with a chief complaint of " post heart catheterization follow-up." Patient's past medical history and cardiovascular risk factors include: hypertension, hyperlipidemia, HFpEF/stage B/ NYHA II, COPD, OA, depression, history of breast cancer s/p lumpectomy in 2005 and radiation and chemotherapy in 2006, non-alcoholic cirrhosis of liver in 2015, and former tobacco use.  Patient is being managed in the clinic for the history of heart failure with preserved EF.  Clinically patient has remained euvolemic.  However, during prior visit she is complained of shortness of breath at rest and with effort related activities.  At the last visit she also described precordial chest pain with minimal walking.  Shared decision was to proceed with left and right heart catheterization.  Angiographically patient was noted to have normal epicardial coronary arteries, mild aortic stenosis, and borderline pulmonary hypertension with a mean PAP of 23 mmHg likely secondary to WHO group 3.  Today patient does not have any chest pain at rest or with effort related activities.  She has an upcoming appointment with pulmonary medicine later today at 3 PM.  She does have shortness of breath with effort related activities but much improved compared to last office visit.  She is tolerating the transition from Toprol-XL to Cardizem well without any side effects or intolerances.  FUNCTIONAL STATUS: No structured exercise program or daily routine.   ALLERGIES: No Known Allergies   MEDICATION LIST PRIOR TO  VISIT: Current Outpatient Medications on File Prior to Visit  Medication Sig Dispense Refill  . albuterol (VENTOLIN HFA) 108 (90 Base) MCG/ACT inhaler Inhale 2 puffs into the lungs every 4 (four) hours as needed for wheezing or shortness of breath.    Marland Kitchen amitriptyline (ELAVIL) 25 MG tablet Take 2 tablets (50 mg total) by mouth at bedtime. 180 tablet 1  . calcium carbonate (OS-CAL) 600 MG tablet Take 600 mg by mouth daily.    . chlorthalidone (HYGROTON) 50 MG tablet TAKE 1 TABLET BY MOUTH EVERY DAY (Patient taking differently: Take 50 mg by mouth daily.) 90 tablet 1  . Cholecalciferol (D-3-5) 125 MCG (5000 UT) capsule Take 10,000 Units by mouth daily.    . Magnesium Oxide 400 MG CAPS Take 1 capsule (400 mg total) by mouth daily. 30 capsule 2  . Melatonin 10 MG TABS Take 10 mg by mouth at bedtime.    . metFORMIN (GLUCOPHAGE) 500 MG tablet TAKE 1 TABLET BY MOUTH TWICE A DAY WITH A MEAL 60 tablet 0  . Multiple Vitamins-Minerals (ONE-A-DAY WOMENS 50+ ADVANTAGE PO) Take 1 tablet by mouth daily.    . rosuvastatin (CRESTOR) 10 MG tablet Take 1 tablet (10 mg total) by mouth at bedtime. 90 tablet 0  . dimenhyDRINATE (DRAMAMINE) 50 MG tablet Take 50 mg by mouth every 8 (eight) hours as needed for nausea.     No current facility-administered medications on file prior to visit.    PAST MEDICAL HISTORY: Past Medical History:  Diagnosis Date  . Aortic stenosis   . Breast cancer (HCC)   . Cancer (HCC)   . Chest pain   . Chronic kidney disease   .  Cirrhosis ([place])   . COPD (chronic obstructive pulmonary disease) ([place])   . DJD (degenerative joint disease)   . GERD (gastroesophageal reflux disease)   . Hyperlipidemia   . Hypertension   . Leg edema   . Orthopnea   . Palpitation   . Personal history of radiation therapy   . SOB (shortness of breath)   . Vitamin D deficiency     PAST SURGICAL HISTORY: Past Surgical History:  Procedure Laterality Date  . BREAST BIOPSY    . BREAST LUMPECTOMY Left    . CARDIAC CATHETERIZATION    . CORONARY ANGIOPLASTY    . LAPAROSCOPIC TOTAL HYSTERECTOMY    . RIGHT/LEFT HEART CATH AND CORONARY ANGIOGRAPHY N/A 12/18/2020   Procedure: RIGHT/LEFT HEART CATH AND CORONARY ANGIOGRAPHY;  Surgeon: Nigel Mormon, MD;  Location: [facility];  Service: Cardiovascular;  Laterality: N/A;    FAMILY HISTORY: The patient's family history includes Cardiomyopathy in her father; Heart attack in her mother; Stroke in her mother.   SOCIAL HISTORY:  The patient  reports that she quit smoking about 17 years ago. Her smoking use included cigarettes. She started smoking about 59 years ago. She has a 15.00 pack-year smoking history. She has never used smokeless tobacco. She reports that she does not drink alcohol and does not use drugs.  Review of Systems  Constitutional: Negative for chills, fever and weight loss.  HENT: Negative for hoarse voice and nosebleeds.   Eyes: Negative for discharge, double vision and pain.  Cardiovascular: Positive for dyspnea on exertion (chronic and stable). Negative for chest pain, claudication, leg swelling, near-syncope, orthopnea, palpitations, paroxysmal nocturnal dyspnea and syncope.  Respiratory: Positive for snoring. Negative for hemoptysis and shortness of breath.   Musculoskeletal: Positive for back pain and joint pain. Negative for muscle cramps and myalgias.  Gastrointestinal: Negative for abdominal pain, constipation, diarrhea, hematemesis, hematochezia, melena, nausea and vomiting.  Neurological: Negative for dizziness and light-headedness.    PHYSICAL EXAM: Vitals with BMI 01/01/2021 12/18/2020 12/18/2020  Height 5\' 2"  - -  Weight 204 lbs 6 oz - -  BMI 52.84 - -  Systolic 132 440 -  Diastolic 82 67 -  Pulse 102 83 81   CONSTITUTIONAL: Appears older than stated age, hemodynamically stable, conversational dyspnea, well-nourished.   SKIN: Skin is warm and dry. No rash noted. No cyanosis. No pallor. No jaundice HEAD:  Normocephalic and atraumatic.  EYES: No scleral icterus MOUTH/THROAT: Moist oral membranes.  NECK: No JVD present. No thyromegaly noted. No carotid bruits  LYMPHATIC: No visible cervical adenopathy.  CHEST Normal respiratory effort. No intercostal retractions  LUNGS: Clear to auscultation bilaterally.  No stridor. No wheezes. No rales.  CARDIOVASCULAR: Regular rate and rhythm, positive V2-Z3, soft holosystolic murmur at the apex, no gallops or rubs. ABDOMINAL: Obese, soft, nontender, nondistended, positive bowel sounds all 4 quadrants, no apparent ascites.  EXTREMITIES: No peripheral edema  HEMATOLOGIC: No significant bruising NEUROLOGIC: Oriented to person, place, and time. Nonfocal. Normal muscle tone.  PSYCHIATRIC: Normal mood and affect. Normal behavior. Cooperative  CARDIAC DATABASE: EKG: 01/01/2021: NSR, 96bpm, LAD, LAFB, old anteroseptal infarct, rare PVCs.  Echocardiogram: 10/22/2020: Normal LV systolic function with visual EF 50-55%. Left ventricle cavity is normal in size. Normal global wall motion. Asymmetric septal bulge. Indeterminate diastolic filling pattern, elevated LAP.  Mild calcification of the aortic valve annulus. Moderate aortic valve leaflet calcification, mostly of the non coronary cusp with mildly restricted aortic valve leaflets.  Suspect mild to moderate aortic valve stenosis (  AVA 1.16cm2, DI 0.4) but MG 8.18mmHg and peak velocity 1.20m/s.  No aortic valve regurgitation noted.  Mild mitral valve leaflet thickening with mild calcification. Trace mitral regurgitation.  Recommend: Limited echo to re-evaluate the aortic valve hemodynamics via various acoustic windows. Clinical correlation is required.  Compared to prior study dated LVEF was 64% and now 50-55% otherwise no significant change noted.   Stress Testing:  Lexiscan Myoview stress test 03/12/2019: Lexiscan stress test was performed. Stress EKG is non-diagnostic, as this is pharmacological stress  test. Normal myocardial perfusion, with mild uniform breast tissue attenuation in inferior myocardium. LVEF 53%. Low risk study.   Heart Catheterization: 12/18/2020: Angiographically normal coronary arteries No coronary artery disease Mild aortic stenosis, mean PG 9 mmHg Likely etiology of dyspnea is COPD and borderline PH (mPAP 23 mmHg), likely WHO Grp III  Carotid artery duplex 03/19/2019: No hemodynamically significant stenosis noted in bilateral internal carotid arteries. Minimal heterogeneous plaque noted in the left ICA.  The right CCA velocity is minimally elevated but no significant plaque noted. Antegrade right vertebral artery flow. Antegrade left vertebral artery flow.  14 day extended Holter monitor: Dominant rhythm normal sinus rhythm.  Heart rate 58-187 bpm.  Avg HR 83 bpm. No atrial fibrillation, ventricular tachycardia, high grade AV block, pauses (3 seconds or longer). Total ventricular ectopic burden <1%. Total supraventricular ectopic burden <1%. Patient triggered events: 4.  Predominately normal sinus rhythm with one episode of SVT and occasional ventricular ectopy.   LABORATORY DATA: CBC Latest Ref Rng & Units 12/18/2020 12/18/2020 12/09/2020  WBC 3.4 - 10.8 x10E3/uL - - 5.7  Hemoglobin 12.0 - 15.0 g/dL 11.6(L) 11.9(L) 14.6  Hematocrit 36.0 - 46.0 % 34.0(L) 35.0(L) 42.1  Platelets 150 - 450 x10E3/uL - - 127(L)    CMP Latest Ref Rng & Units 12/18/2020 12/18/2020 12/09/2020  Glucose 65 - 99 mg/dL - - 182(H)  BUN 8 - 27 mg/dL - - 20  Creatinine 0.57 - 1.00 mg/dL - - 0.94  Sodium 135 - 145 mmol/L 142 141 141  Potassium 3.5 - 5.1 mmol/L 2.7(LL) 2.7(LL) 3.6  Chloride 96 - 106 mmol/L - - 98  CO2 20 - 29 mmol/L - - 24  Calcium 8.7 - 10.3 mg/dL - - 9.7  Total Protein 6.5 - 8.1 g/dL - - -  Total Bilirubin 0.3 - 1.2 mg/dL - - -  Alkaline Phos 38 - 126 U/L - - -  AST 15 - 41 U/L - - -  ALT 0 - 44 U/L - - -    Lipid Panel     Component Value Date/Time   CHOL 166  12/09/2020 1035   TRIG 125 12/09/2020 1035   HDL 47 12/09/2020 1035   LDLCALC 97 12/09/2020 1035   LDLDIRECT 104 (H) 12/09/2020 1035   LABVLDL 22 12/09/2020 1035    No results found for: HGBA1C No components found for: NTPROBNP No results found for: TSH  Cardiac Panel (last 3 results) No results for input(s): CKTOTAL, CKMB, TROPONINIHS, RELINDX in the last 72 hours.   External Labs: Collected: 09/05/2019 [facility] available in Care Everywhere Hemoglobin 15.5 g/dL, hematocrit 44.4% Creatinine 0.82 mg/dL. Potassium 3.1 AST 28, ALT 15, alkaline phosphatase 62 Lipid profile: Total cholesterol 155, triglycerides 111, HDL 48, LDL 87, non-HDL 107 Hemoglobin A1c: 7.7  IMPRESSION:    ICD-10-CM   1. Dyspnea on exertion  R06.00 EKG 12-Lead    diltiazem (CARDIZEM CD) 240 MG 24 hr capsule  2. Chronic diastolic heart failure (  [place])  I50.32   3. Non-insulin dependent type 2 diabetes mellitus ([place])  E11.9   4. Chronic obstructive pulmonary disease, unspecified COPD type ([place])  J44.9   5. Non-alcoholic cirrhosis ([place])  K74.60   6. Hx of breast cancer  Z85.3   7. Former smoker  Z87.891   4. Mild aortic stenosis  I35.0   9. PVC's (premature ventricular contractions)  I49.3 diltiazem (CARDIZEM CD) 240 MG 24 hr capsule  10. Palpitations  R00.2 diltiazem (CARDIZEM CD) 240 MG 24 hr capsule  11. PSVT (paroxysmal supraventricular tachycardia) (HCC)  I47.1      RECOMMENDATIONS: [name] [name] is a 80 y.o. female whose past medical history and cardiovascular risk factors include: hypertension, hyperlipidemia, non-insulin-dependent diabetes mellitus type 2, HFpEF/stage B/ NYHA II, COPD, OA, depression, history of breast cancer s/p lumpectomy in 2005 and radiation and chemotherapy in 1950, non-alcoholic cirrhosis of liver in 2015, and former tobacco use.  Dyspnea on exertion:  Present, but improving. Appears to be overall euvolemic.  NT proBNP within normal limits. Left heart  catheterization notes normal epicardial coronary arteries and mild aortic stenosis. Right heart catheterization notes borderline pulmonary hypertension with mean PAP 25 mmHg most likely suggestive of WHO class III. I suspect that dyspnea on exertion is most likely due to pulmonary etiology given her history of smoking,?  Obesity hypoventilatory syndrome, COPD. Patient would also benefit from sleep apnea evaluation.  Mixed hyperlipidemia: Given the history of NIDDM Type 2 recommend an LDL goal of less than 70 mg/dL. Tolerating Crestor 10 mg p.o. nightly, well without any side effects or intolerances.  Palpitations: Better controlled. No significant dysrhythmias noted. Patient had episodes of isolated PVCs with a total ectopic burden of less than 1% and episodes of PSVT. She was started on Toprol-XL in the past but did not tolerate the medication well. She was then transitioned to Cardizem 120 mg p.o. daily at last office visit which she tolerated well.  However since her ventricular rate is still elevated and noted to have PVCs on surface EKG we will uptitrate Cardizem to 240 mg p.o. daily.  Prescription sent.    Aortic stenosis: Mild, primary invasive hemodynamics Repeat echocardiogram scheduled for August 2022.   Denies any symptoms of angina pectoris, heart failure, or syncope. We will continue to monitor  Benign essential hypertension: Blood pressure currently at goal.  Medications reconciled.  Currently managed by primary care provider.  Former smoker: 23 year pack history of smoking.  Educated on the importance of continued smoking cessation.  FINAL MEDICATION LIST END OF ENCOUNTER: Meds ordered this encounter  Medications  . diltiazem (CARDIZEM CD) 240 MG 24 hr capsule    Sig: Take 1 capsule (240 mg total) by mouth daily.    Dispense:  90 capsule    Refill:  0    Current Outpatient Medications:  .  albuterol (VENTOLIN HFA) 108 (90 Base) MCG/ACT inhaler, Inhale 2 puffs into  the lungs every 4 (four) hours as needed for wheezing or shortness of breath., Disp: , Rfl:  .  amitriptyline (ELAVIL) 25 MG tablet, Take 2 tablets (50 mg total) by mouth at bedtime., Disp: 180 tablet, Rfl: 1 .  calcium carbonate (OS-CAL) 600 MG tablet, Take 600 mg by mouth daily., Disp: , Rfl:  .  chlorthalidone (HYGROTON) 50 MG tablet, TAKE 1 TABLET BY MOUTH EVERY DAY (Patient taking differently: Take 50 mg by mouth daily.), Disp: 90 tablet, Rfl: 1 .  Cholecalciferol (D-3-5) 125 MCG (5000 UT) capsule, Take 10,000  Units by mouth daily., Disp: , Rfl:  .  diltiazem (CARDIZEM CD) 240 MG 24 hr capsule, Take 1 capsule (240 mg total) by mouth daily., Disp: 90 capsule, Rfl: 0 .  Magnesium Oxide 400 MG CAPS, Take 1 capsule (400 mg total) by mouth daily., Disp: 30 capsule, Rfl: 2 .  Melatonin 10 MG TABS, Take 10 mg by mouth at bedtime., Disp: , Rfl:  .  metFORMIN (GLUCOPHAGE) 500 MG tablet, TAKE 1 TABLET BY MOUTH TWICE A DAY WITH A MEAL, Disp: 60 tablet, Rfl: 0 .  Multiple Vitamins-Minerals (ONE-A-DAY WOMENS 50+ ADVANTAGE PO), Take 1 tablet by mouth daily., Disp: , Rfl:  .  rosuvastatin (CRESTOR) 10 MG tablet, Take 1 tablet (10 mg total) by mouth at bedtime., Disp: 90 tablet, Rfl: 0 .  dimenhyDRINATE (DRAMAMINE) 50 MG tablet, Take 50 mg by mouth every 8 (eight) hours as needed for nausea., Disp: , Rfl:   Orders Placed This Encounter  Procedures  . EKG 12-Lead   --Continue cardiac medications as reconciled in final medication list. --Return in about 6 months (around 07/03/2021) for Follow up, Dyspnea. Or sooner if needed. --Continue follow-up with your primary care physician regarding the management of your other chronic comorbid conditions.  Patient's questions and concerns were addressed to her satisfaction. She voices understanding of the instructions provided during this encounter.   This note was created using a voice recognition software as a result there may be grammatical errors inadvertently  enclosed that do not reflect the nature of this encounter. Every attempt is made to correct such errors.  Time spent: 45 minutes.  Reevaluating her dyspnea, reviewing the most recent Holter monitor results, labs, discussing medication intolerances and transitioning pharmacological therapy, obtaining informed consent for the upcoming right and left heart catheterization, coordination of care.  Rex Kras, [place], [facility]  Pager: [phone] Office: [phone]

## 2021-01-01 ENCOUNTER — Ambulatory Visit: Payer: Medicare PPO | Admitting: Cardiology

## 2021-01-01 ENCOUNTER — Encounter: Payer: Self-pay | Admitting: Cardiology

## 2021-01-01 ENCOUNTER — Ambulatory Visit: Payer: Medicare PPO | Admitting: Pulmonary Disease

## 2021-01-01 ENCOUNTER — Encounter: Payer: Self-pay | Admitting: Pulmonary Disease

## 2021-01-01 ENCOUNTER — Other Ambulatory Visit: Payer: Self-pay

## 2021-01-01 ENCOUNTER — Ambulatory Visit (INDEPENDENT_AMBULATORY_CARE_PROVIDER_SITE_OTHER): Payer: Medicare PPO

## 2021-01-01 VITALS — BP 134/80 | HR 98 | Temp 98.0°F | Ht 63.0 in | Wt 203.0 lb

## 2021-01-01 VITALS — BP 128/82 | HR 101 | Temp 98.4°F | Resp 16 | Ht 62.0 in | Wt 204.4 lb

## 2021-01-01 DIAGNOSIS — Z853 Personal history of malignant neoplasm of breast: Secondary | ICD-10-CM | POA: Diagnosis not present

## 2021-01-01 DIAGNOSIS — I350 Nonrheumatic aortic (valve) stenosis: Secondary | ICD-10-CM

## 2021-01-01 DIAGNOSIS — E119 Type 2 diabetes mellitus without complications: Secondary | ICD-10-CM

## 2021-01-01 DIAGNOSIS — R0609 Other forms of dyspnea: Secondary | ICD-10-CM | POA: Diagnosis not present

## 2021-01-01 DIAGNOSIS — I5032 Chronic diastolic (congestive) heart failure: Secondary | ICD-10-CM | POA: Diagnosis not present

## 2021-01-01 DIAGNOSIS — R0602 Shortness of breath: Secondary | ICD-10-CM

## 2021-01-01 DIAGNOSIS — R0600 Dyspnea, unspecified: Secondary | ICD-10-CM

## 2021-01-01 DIAGNOSIS — J449 Chronic obstructive pulmonary disease, unspecified: Secondary | ICD-10-CM | POA: Diagnosis not present

## 2021-01-01 DIAGNOSIS — J4530 Mild persistent asthma, uncomplicated: Secondary | ICD-10-CM

## 2021-01-01 DIAGNOSIS — Z87891 Personal history of nicotine dependence: Secondary | ICD-10-CM | POA: Diagnosis not present

## 2021-01-01 DIAGNOSIS — I493 Ventricular premature depolarization: Secondary | ICD-10-CM | POA: Diagnosis not present

## 2021-01-01 DIAGNOSIS — R002 Palpitations: Secondary | ICD-10-CM

## 2021-01-01 DIAGNOSIS — I471 Supraventricular tachycardia: Secondary | ICD-10-CM

## 2021-01-01 DIAGNOSIS — K7460 Unspecified cirrhosis of liver: Secondary | ICD-10-CM | POA: Diagnosis not present

## 2021-01-01 MED ORDER — DILTIAZEM HCL ER COATED BEADS 240 MG PO CP24: 240.0000 mg | ORAL_CAPSULE | Freq: Every day | ORAL | 0 refills | Status: DC

## 2021-01-01 MED ORDER — FLUTICASONE-SALMETEROL 250-50 MCG/ACT IN AEPB: 1.0000 | INHALATION_SPRAY | Freq: Two times a day (BID) | RESPIRATORY_TRACT | 3 refills | Status: DC

## 2021-01-01 NOTE — Progress Notes (Signed)
Synopsis: Referred in April 2022 for COPD by Marda Stalker, PA  Subjective:   PATIENT ID: [name] [name]: female DOB: 18-Feb-1941, MRN: [id number]   HPI  Chief Complaint  Patient presents with  . Follow-up    Sob with exertion worse over 2 years, wheezing.    [name] [name] is a 80 year old woman, former smoker with history of breast cancer s/p chemotherapy/radiation, NASH Cirrhosis, GERD, and aortic stenosis who is referred to pulmonary clinic for shortness of breath.  She has had progressive dyspnea over the past 3 years which is worse with exertion. She reports some wheezing and has a cough a night. The albuterol does help with her cough. She reports some sinus congestion but no drainage. She reports a history of episodes of bronchitis frequently in the winter times.  She quit smoking in 2006. She smoked half a pack for 25 to 30 years. She quit smoking using chantix.   She does have bad heartburn symptoms and takes an as needed tums.   Past Medical History:  Diagnosis Date  . Aortic stenosis   . Breast cancer ([place])   . Cancer ([place])   . Chest pain   . Chronic kidney disease   . Cirrhosis ([place])   . COPD (chronic obstructive pulmonary disease) ([place])   . DJD (degenerative joint disease)   . GERD (gastroesophageal reflux disease)   . Hyperlipidemia   . Hypertension   . Leg edema   . Orthopnea   . Palpitation   . Personal history of radiation therapy   . SOB (shortness of breath)   . Vitamin D deficiency      Family History  Problem Relation Age of Onset  . Heart attack Mother   . Stroke Mother   . Cardiomyopathy Father      Social History   Socioeconomic History  . Marital status: Widowed    Spouse name: Not on file  . Number of children: 4  . Years of education: Not on file  . Highest education level: Not on file  Occupational History  . Not on file  Tobacco Use  . Smoking status: Former Smoker    Packs/day: 0.50    Years: 30.00    Pack  years: 15.00    Types: Cigarettes    Start date: 02/12/1961    Quit date: 2005    Years since quitting: 17.3  . Smokeless tobacco: Never Used  Vaping Use  . Vaping Use: Never used  Substance and Sexual Activity  . Alcohol use: Never  . Drug use: Never  . Sexual activity: Not on file  Other Topics Concern  . Not on file  Social History Narrative  . Not on file   Social Determinants of Health   Financial Resource Strain: Not on file  Food Insecurity: Not on file  Transportation Needs: Not on file  Physical Activity: Not on file  Stress: Not on file  Social Connections: Not on file  Intimate Partner Violence: Not on file     No Known Allergies   Outpatient Medications Prior to Visit  Medication Sig Dispense Refill  . albuterol (VENTOLIN HFA) 108 (90 Base) MCG/ACT inhaler Inhale 2 puffs into the lungs every 4 (four) hours as needed for wheezing or shortness of breath.    Marland Kitchen amitriptyline (ELAVIL) 25 MG tablet Take 2 tablets (50 mg total) by mouth at bedtime. 180 tablet 1  . calcium carbonate (OS-CAL) 600 MG tablet Take 600 mg by mouth daily.    Marland Kitchen  chlorthalidone (HYGROTON) 50 MG tablet TAKE 1 TABLET BY MOUTH EVERY DAY (Patient taking differently: Take 50 mg by mouth daily.) 90 tablet 1  . Cholecalciferol (D-3-5) 125 MCG (5000 UT) capsule Take 10,000 Units by mouth daily.    Marland Kitchen diltiazem (CARDIZEM CD) 240 MG 24 hr capsule Take 1 capsule (240 mg total) by mouth daily. 90 capsule 0  . dimenhyDRINATE (DRAMAMINE) 50 MG tablet Take 50 mg by mouth every 8 (eight) hours as needed for nausea.    . Magnesium Oxide 400 MG CAPS Take 1 capsule (400 mg total) by mouth daily. 30 capsule 2  . Melatonin 10 MG TABS Take 10 mg by mouth at bedtime.    . Multiple Vitamins-Minerals (ONE-A-DAY WOMENS 50+ ADVANTAGE PO) Take 1 tablet by mouth daily.    . rosuvastatin (CRESTOR) 10 MG tablet Take 1 tablet (10 mg total) by mouth at bedtime. 90 tablet 0  . metFORMIN (GLUCOPHAGE) 500 MG tablet TAKE 1 TABLET BY  MOUTH TWICE A DAY WITH A MEAL 60 tablet 0   No facility-administered medications prior to visit.    Review of Systems  Constitutional: Negative for chills, fever, malaise/fatigue and weight loss.  HENT: Negative for congestion, sinus pain and sore throat.   Eyes: Negative.   Respiratory: Positive for shortness of breath and wheezing. Negative for cough, hemoptysis and sputum production.   Cardiovascular: Negative for chest pain, palpitations, orthopnea, claudication and leg swelling.  Gastrointestinal: Positive for heartburn. Negative for abdominal pain, nausea and vomiting.  Genitourinary: Negative.   Musculoskeletal: Negative for joint pain and myalgias.  Skin: Negative for rash.  Neurological: Negative for weakness.  Endo/Heme/Allergies: Negative.   Psychiatric/Behavioral: Negative.    Objective:   Vitals:   01/01/21 1443  BP: 134/80  Pulse: 98  Temp: 98 F (36.7 C)  SpO2: 92%  Weight: 203 lb (92.1 kg)  Height: 5\' 3"  (1.6 m)     Physical Exam Constitutional:      General: She is not in acute distress.    Appearance: She is obese. She is not ill-appearing.  HENT:     Head: Normocephalic and atraumatic.     Nose: Nose normal.     Mouth/Throat:     Mouth: Mucous membranes are moist.     Pharynx: Oropharynx is clear.  Eyes:     General: No scleral icterus.    Conjunctiva/sclera: Conjunctivae normal.     Pupils: Pupils are equal, round, and reactive to light.  Cardiovascular:     Rate and Rhythm: Normal rate and regular rhythm.     Pulses: Normal pulses.     Heart sounds: Normal heart sounds. No murmur heard.   Pulmonary:     Effort: Pulmonary effort is normal.     Breath sounds: Normal breath sounds. No wheezing, rhonchi or rales.  Abdominal:     General: Bowel sounds are normal.     Palpations: Abdomen is soft.  Musculoskeletal:     Right lower leg: No edema.     Left lower leg: No edema.  Lymphadenopathy:     Cervical: No cervical adenopathy.  Skin:     General: Skin is warm and dry.  Neurological:     General: No focal deficit present.     Mental Status: She is alert.  Psychiatric:        Mood and Affect: Mood normal.        Behavior: Behavior normal.        Thought Content: Thought content normal.  Judgment: Judgment normal.     CBC    Component Value Date/Time   WBC 5.7 12/09/2020 1035   WBC 6.9 09/26/2019 1420   WBC 7.5 05/15/2007 1507   WBC 7.3 01/19/2007 1122   RBC 4.58 12/09/2020 1035   RBC 4.70 09/26/2019 1420   HGB 11.6 (L) 12/18/2020 0803   HGB 14.6 12/09/2020 1035   HGB 13.4 05/15/2007 1507   HCT 34.0 (L) 12/18/2020 0803   HCT 42.1 12/09/2020 1035   HCT 37.1 05/15/2007 1507   PLT 127 (L) 12/09/2020 1035   MCV 92 12/09/2020 1035   MCV 95.1 05/15/2007 1507   MCH 31.9 12/09/2020 1035   MCH 32.1 09/26/2019 1420   MCHC 34.7 12/09/2020 1035   MCHC 34.6 09/26/2019 1420   RDW 12.2 12/09/2020 1035   RDW 12.2 05/15/2007 1507   LYMPHSABS 2.2 09/26/2019 1420   LYMPHSABS 3.4 (H) 05/15/2007 1507   MONOABS 0.6 09/26/2019 1420   MONOABS 0.7 05/15/2007 1507   EOSABS 0.2 09/26/2019 1420   EOSABS 0.2 05/15/2007 1507   BASOSABS 0.0 09/26/2019 1420   BASOSABS 0.0 05/15/2007 1507   BMP Latest Ref Rng & Units 12/18/2020 12/18/2020 12/09/2020  Glucose 65 - 99 mg/dL - - 182(H)  BUN 8 - 27 mg/dL - - 20  Creatinine 0.57 - 1.00 mg/dL - - 0.94  BUN/Creat Ratio 12 - 28 - - 21  Sodium 135 - 145 mmol/L 142 141 141  Potassium 3.5 - 5.1 mmol/L 2.7(LL) 2.7(LL) 3.6  Chloride 96 - 106 mmol/L - - 98  CO2 20 - 29 mmol/L - - 24  Calcium 8.7 - 10.3 mg/dL - - 9.7   Chest imaging: CXR 01/01/21 Kyphosis present. No opacities or pleural effusions noted. Mildly elevated left hemidiaphragm. Heart border appears normal.  PFT: No flowsheet data found.  Echo 10/22/20: Normal LV systolic function with visual EF 50-55%. Left ventricle cavity  is normal in size. Normal global wall motion. Asymmetric septal bulge.  Indeterminate diastolic  filling pattern, elevated LAP.  Mild calcification of the aortic valve annulus. Moderate aortic valve  leaflet calcification, mostly of the non coronary cusp with mildly  restricted aortic valve leaflets.  Suspect mild to moderate aortic valve stenosis (AVA 1.16cm2, DI 0.4) but  MG 8.73mmHg and peak velocity 1.24m/s.  No aortic valve regurgitation noted.  Mild mitral valve leaflet thickening with mild calcification. Trace mitral  regurgitation.   Heart Catheterization 12/18/20: RA: 8 mmHg RV: 36/1 mmHg PA: 33/17 mmHg, mPAP 23 mmHg PCW: 11 mmHg LV end diastolic pressure is normal. LV 131/2 mmHg LVEDP 7 mmHg LV-Ao peak-to-peak gradient 11 mmHg, mean PG 9 mmHg CO: 6.7 L/min CI: 3.5 L/min/m2  Assessment & Plan:   Mild persistent reactive airway disease without complication - Plan: fluticasone-salmeterol (WIXELA INHUB) 250-50 MCG/ACT AEPB  Shortness of breath - Plan: Pulmonary function test, DG Chest 2 View  Discussion: [name] [name] is a 79 year old woman, former smoker with history of breast cancer s/p chemotherapy/radiation, NASH Cirrhosis, GERD, and aortic stenosis who is referred to pulmonary clinic for shortness of breath.  Chest radiograph today shows increased interstitial prominence throughout and significant kyphosis.   Her dyspnea could be related to restrictive defects secondary to her abdominal girth and her kyphotic spine. She also has a history for reactive airways disease or asthma. There is also a mild elevation in her PASP based on recent heart cath.   We will start her on ICS/LABA therapy with Wixella inhub. We will obtain pulmonary  function tests in 2 months at follow up.   We will also consider sleep testing in the future if there is concern for a degree of pulmonary hypertension contributing to her dyspnea.   [name] Jackson, MD [facility] Office: [phone]   Current Outpatient Medications:  .  albuterol (VENTOLIN HFA) 108 (90 Base)  MCG/ACT inhaler, Inhale 2 puffs into the lungs every 4 (four) hours as needed for wheezing or shortness of breath., Disp: , Rfl:  .  amitriptyline (ELAVIL) 25 MG tablet, Take 2 tablets (50 mg total) by mouth at bedtime., Disp: 180 tablet, Rfl: 1 .  calcium carbonate (OS-CAL) 600 MG tablet, Take 600 mg by mouth daily., Disp: , Rfl:  .  chlorthalidone (HYGROTON) 50 MG tablet, TAKE 1 TABLET BY MOUTH EVERY DAY (Patient taking differently: Take 50 mg by mouth daily.), Disp: 90 tablet, Rfl: 1 .  Cholecalciferol (D-3-5) 125 MCG (5000 UT) capsule, Take 10,000 Units by mouth daily., Disp: , Rfl:  .  diltiazem (CARDIZEM CD) 240 MG 24 hr capsule, Take 1 capsule (240 mg total) by mouth daily., Disp: 90 capsule, Rfl: 0 .  dimenhyDRINATE (DRAMAMINE) 50 MG tablet, Take 50 mg by mouth every 8 (eight) hours as needed for nausea., Disp: , Rfl:  .  fluticasone-salmeterol (WIXELA INHUB) 250-50 MCG/ACT AEPB, Inhale 1 puff into the lungs in the morning and at bedtime., Disp: 60 each, Rfl: 3 .  Magnesium Oxide 400 MG CAPS, Take 1 capsule (400 mg total) by mouth daily., Disp: 30 capsule, Rfl: 2 .  Melatonin 10 MG TABS, Take 10 mg by mouth at bedtime., Disp: , Rfl:  .  Multiple Vitamins-Minerals (ONE-A-DAY WOMENS 50+ ADVANTAGE PO), Take 1 tablet by mouth daily., Disp: , Rfl:  .  rosuvastatin (CRESTOR) 10 MG tablet, Take 1 tablet (10 mg total) by mouth at bedtime., Disp: 90 tablet, Rfl: 0 .  metFORMIN (GLUCOPHAGE) 500 MG tablet, TAKE 1 TABLET BY MOUTH TWICE A DAY WITH MEALS, Disp: 180 tablet, Rfl: 1

## 2021-01-01 NOTE — Patient Instructions (Signed)
We will check a chest x-ray today  Start Wixella inhaler 1 puff twice daily  Continue to use albuterol inhaler as needed 1-2 puffs every 4-6 hours as needed  Continue to work on weight loss  We will have you follow up in 2 months with pulmonary function tests

## 2021-01-02 ENCOUNTER — Encounter: Payer: Self-pay | Admitting: Pulmonary Disease

## 2021-01-02 ENCOUNTER — Other Ambulatory Visit: Payer: Self-pay | Admitting: Cardiology

## 2021-01-13 ENCOUNTER — Other Ambulatory Visit: Payer: Self-pay

## 2021-01-13 MED ORDER — METFORMIN HCL 500 MG PO TABS: 1.0000 | ORAL_TABLET | Freq: Two times a day (BID) | ORAL | 1 refills | Status: DC

## 2021-02-13 ENCOUNTER — Other Ambulatory Visit: Payer: Self-pay

## 2021-02-13 ENCOUNTER — Other Ambulatory Visit: Payer: Self-pay | Admitting: Cardiology

## 2021-02-13 DIAGNOSIS — R0609 Other forms of dyspnea: Secondary | ICD-10-CM

## 2021-02-13 MED ORDER — ROSUVASTATIN CALCIUM 10 MG PO TABS: ORAL_TABLET | ORAL | 1 refills | Status: DC

## 2021-02-13 MED ORDER — AMITRIPTYLINE HCL 25 MG PO TABS: 50.0000 mg | ORAL_TABLET | Freq: Every day | ORAL | 1 refills | Status: DC

## 2021-03-02 ENCOUNTER — Other Ambulatory Visit (HOSPITAL_COMMUNITY)
Admission: RE | Admit: 2021-03-02 | Discharge: 2021-03-02 | Disposition: A | Discharge status: Home / Self Care | Payer: Medicare HMO | Source: Ambulatory Visit | Attending: Pulmonary Disease | Admitting: Pulmonary Disease

## 2021-03-02 DIAGNOSIS — Z01812 Encounter for preprocedural laboratory examination: Secondary | ICD-10-CM | POA: Diagnosis not present

## 2021-03-02 DIAGNOSIS — Z20822 Contact with and (suspected) exposure to covid-19: Secondary | ICD-10-CM | POA: Insufficient documentation

## 2021-03-02 LAB — SARS CORONAVIRUS 2 (TAT 6-24 HRS): SARS Coronavirus 2: NEGATIVE

## 2021-03-04 ENCOUNTER — Ambulatory Visit (INDEPENDENT_AMBULATORY_CARE_PROVIDER_SITE_OTHER): Payer: Medicare HMO | Admitting: Pulmonary Disease

## 2021-03-04 ENCOUNTER — Other Ambulatory Visit: Payer: Medicare HMO

## 2021-03-04 ENCOUNTER — Encounter: Payer: Self-pay | Admitting: Pulmonary Disease

## 2021-03-04 ENCOUNTER — Other Ambulatory Visit: Payer: Self-pay

## 2021-03-04 VITALS — BP 114/64 | HR 105 | Ht 61.0 in | Wt 203.6 lb

## 2021-03-04 DIAGNOSIS — R942 Abnormal results of pulmonary function studies: Secondary | ICD-10-CM

## 2021-03-04 DIAGNOSIS — I272 Pulmonary hypertension, unspecified: Secondary | ICD-10-CM

## 2021-03-04 DIAGNOSIS — R0602 Shortness of breath: Secondary | ICD-10-CM | POA: Diagnosis not present

## 2021-03-04 LAB — PULMONARY FUNCTION TEST
DL/VA % pred: 74 %
DL/VA: 3.1 ml/min/mmHg/L
DLCO cor % pred: 74 %
DLCO cor: 12.7 ml/min/mmHg
DLCO unc % pred: 74 %
DLCO unc: 12.7 ml/min/mmHg
FEF 25-75 Post: 1.58 L/sec
FEF 25-75 Pre: 1.16 L/sec
FEF2575-%Change-Post: 35 %
FEF2575-%Pred-Post: 117 %
FEF2575-%Pred-Pre: 86 %
FEV1-%Change-Post: 6 %
FEV1-%Pred-Post: 88 %
FEV1-%Pred-Pre: 82 %
FEV1-Post: 1.53 L
FEV1-Pre: 1.43 L
FEV1FVC-%Change-Post: 3 %
FEV1FVC-%Pred-Pre: 102 %
FEV6-%Change-Post: 1 %
FEV6-%Pred-Post: 86 %
FEV6-%Pred-Pre: 85 %
FEV6-Post: 1.92 L
FEV6-Pre: 1.88 L
FEV6FVC-%Change-Post: 0 %
FEV6FVC-%Pred-Post: 105 %
FEV6FVC-%Pred-Pre: 105 %
FVC-%Change-Post: 2 %
FVC-%Pred-Post: 82 %
FVC-%Pred-Pre: 80 %
FVC-Post: 1.93 L
FVC-Pre: 1.88 L
Post FEV1/FVC ratio: 79 %
Post FEV6/FVC ratio: 99 %
Pre FEV1/FVC ratio: 76 %
Pre FEV6/FVC Ratio: 100 %
RV % pred: 106 %
RV: 2.34 L
TLC % pred: 95 %
TLC: 4.38 L

## 2021-03-04 MED ORDER — BREZTRI AEROSPHERE 160-9-4.8 MCG/ACT IN AERO: 2.0000 | INHALATION_SPRAY | Freq: Two times a day (BID) | RESPIRATORY_TRACT | 6 refills | Status: DC

## 2021-03-04 MED ORDER — BREZTRI AEROSPHERE 160-9-4.8 MCG/ACT IN AERO: 2.0000 | INHALATION_SPRAY | Freq: Two times a day (BID) | RESPIRATORY_TRACT | 0 refills | Status: DC

## 2021-03-04 NOTE — Progress Notes (Signed)
PFT done today. 

## 2021-03-04 NOTE — Progress Notes (Signed)
Synopsis: Referred in April 2022 for COPD by Marda Stalker, PA  Subjective:   PATIENT ID: [name] [name] GENDER: female DOB: 1941/03/13, MRN: [id number]   HPI  Chief Complaint  Patient presents with   Follow-up    Patient had PFT today and is having more shortness of breath,    [name] [name] is a 80 year old woman, former smoker with history of breast cancer s/p chemotherapy/radiation, NASH Cirrhosis, GERD, and aortic stenosis who returns to pulmonary clinic for shortness of breath.  Her breathing has been the same since last visit.  She was started on Wixela inhaler and has been using it intermittently without noted benefit.  She continues to use albuterol inhaler as needed.  PFTs today show isolated mild diffusion defect otherwise normal spirometry and lung volumes.  She has not been evaluated by gastroenterologist for concern of underlying liver disease.  She reports she has an appointment on 7/5 with her primary care and will discuss this with them at that time.  She complains of bilateral hand pain and reports that she is concerned she may have rheumatoid arthritis as her mother did.  OV 01/01/21 She has had progressive dyspnea over the past 3 years which is worse with exertion. She reports some wheezing and has a cough a night. The albuterol does help with her cough. She reports some sinus congestion but no drainage. She reports a history of episodes of bronchitis frequently in the winter times.  She quit smoking in 2006. She smoked half a pack for 25 to 30 years. She quit smoking using chantix.   She does have bad heartburn symptoms and takes an as needed tums.   Past Medical History:  Diagnosis Date   Aortic stenosis    Breast cancer (HCC)    Cancer (HCC)    Chest pain    Chronic kidney disease    Cirrhosis (HCC)    COPD (chronic obstructive pulmonary disease) (HCC)    DJD (degenerative joint disease)    GERD (gastroesophageal reflux disease)    Hyperlipidemia     Hypertension    Leg edema    Orthopnea    Palpitation    Personal history of radiation therapy    SOB (shortness of breath)    Vitamin D deficiency      Family History  Problem Relation Age of Onset   Heart attack Mother    Stroke Mother    Cardiomyopathy Father      Social History   Socioeconomic History   Marital status: Widowed    Spouse name: Not on file   Number of children: 4   Years of education: Not on file   Highest education level: Not on file  Occupational History   Not on file  Tobacco Use   Smoking status: Former    Packs/day: 0.50    Years: 30.00    Pack years: 15.00    Types: Cigarettes    Start date: 02/12/1961    Quit date: 2005    Years since quitting: 17.5   Smokeless tobacco: Never  Vaping Use   Vaping Use: Never used  Substance and Sexual Activity   Alcohol use: Never   Drug use: Never   Sexual activity: Not on file  Other Topics Concern   Not on file  Social History Narrative   Not on file   Social Determinants of Health   Financial Resource Strain: Not on file  Food Insecurity: Not on file  Transportation Needs: Not  on file  Physical Activity: Not on file  Stress: Not on file  Social Connections: Not on file  Intimate Partner Violence: Not on file     No Known Allergies   Outpatient Medications Prior to Visit  Medication Sig Dispense Refill   albuterol (VENTOLIN HFA) 108 (90 Base) MCG/ACT inhaler Inhale 2 puffs into the lungs every 4 (four) hours as needed for wheezing or shortness of breath.     amitriptyline (ELAVIL) 25 MG tablet Take 2 tablets (50 mg total) by mouth at bedtime. 180 tablet 1   calcium carbonate (OS-CAL) 600 MG tablet Take 600 mg by mouth daily.     chlorthalidone (HYGROTON) 50 MG tablet TAKE 1 TABLET BY MOUTH EVERY DAY (Patient taking differently: Take 50 mg by mouth daily.) 90 tablet 1   Cholecalciferol (D-3-5) 125 MCG (5000 UT) capsule Take 10,000 Units by mouth daily.     diltiazem (CARDIZEM CD) 240 MG  24 hr capsule Take 1 capsule (240 mg total) by mouth daily. 90 capsule 0   dimenhyDRINATE (DRAMAMINE) 50 MG tablet Take 50 mg by mouth every 8 (eight) hours as needed for nausea.     Magnesium Oxide 400 MG CAPS Take 1 capsule (400 mg total) by mouth daily. 30 capsule 2   Melatonin 10 MG TABS Take 10 mg by mouth at bedtime.     metFORMIN (GLUCOPHAGE) 500 MG tablet Take 1 tablet (500 mg total) by mouth 2 (two) times daily with a meal. 180 tablet 1   Multiple Vitamins-Minerals (ONE-A-DAY WOMENS 50+ ADVANTAGE PO) Take 1 tablet by mouth daily.     rosuvastatin (CRESTOR) 10 MG tablet TAKE 1 TABLET BY MOUTH EVERYDAY AT BEDTIME 90 tablet 1   fluticasone-salmeterol (WIXELA INHUB) 250-50 MCG/ACT AEPB Inhale 1 puff into the lungs in the morning and at bedtime. 60 each 3   No facility-administered medications prior to visit.    Review of Systems  Constitutional:  Negative for chills, fever, malaise/fatigue and weight loss.  HENT:  Negative for congestion, sinus pain and sore throat.   Eyes: Negative.   Respiratory:  Positive for shortness of breath. Negative for cough, hemoptysis, sputum production and wheezing.   Cardiovascular:  Negative for chest pain, palpitations, orthopnea, claudication and leg swelling.  Gastrointestinal:  Negative for abdominal pain, heartburn, nausea and vomiting.  Genitourinary: Negative.   Musculoskeletal:  Positive for joint pain. Negative for myalgias.  Skin:  Negative for rash.  Neurological:  Negative for weakness.  Endo/Heme/Allergies: Negative.   Psychiatric/Behavioral: Negative.     Objective:   Vitals:   03/04/21 1358  BP: 114/64  Pulse: (!) 105  SpO2: 96%  Weight: 203 lb 9.6 oz (92.4 kg)  Height: 5\' 1"  (1.549 m)   Physical Exam Constitutional:      General: She is not in acute distress.    Appearance: She is obese. She is not ill-appearing.  HENT:     Head: Normocephalic and atraumatic.     Nose: Nose normal.     Mouth/Throat:     Mouth: Mucous  membranes are moist.     Pharynx: Oropharynx is clear.  Eyes:     General: No scleral icterus.    Conjunctiva/sclera: Conjunctivae normal.     Pupils: Pupils are equal, round, and reactive to light.  Cardiovascular:     Rate and Rhythm: Normal rate and regular rhythm.     Pulses: Normal pulses.     Heart sounds: Normal heart sounds. No murmur heard. Pulmonary:  Effort: Pulmonary effort is normal.     Breath sounds: Normal breath sounds. No wheezing, rhonchi or rales.  Abdominal:     General: Bowel sounds are normal.     Palpations: Abdomen is soft.  Musculoskeletal:     Right lower leg: No edema.     Left lower leg: No edema.  Lymphadenopathy:     Cervical: No cervical adenopathy.  Skin:    General: Skin is warm and dry.  Neurological:     General: No focal deficit present.     Mental Status: She is alert.  Psychiatric:        Mood and Affect: Mood normal.        Behavior: Behavior normal.        Thought Content: Thought content normal.        Judgment: Judgment normal.    CBC    Component Value Date/Time   WBC 5.7 12/09/2020 1035   WBC 6.9 09/26/2019 1420   WBC 7.5 05/15/2007 1507   WBC 7.3 01/19/2007 1122   RBC 4.58 12/09/2020 1035   RBC 4.70 09/26/2019 1420   HGB 11.6 (L) 12/18/2020 0803   HGB 14.6 12/09/2020 1035   HGB 13.4 05/15/2007 1507   HCT 34.0 (L) 12/18/2020 0803   HCT 42.1 12/09/2020 1035   HCT 37.1 05/15/2007 1507   PLT 127 (L) 12/09/2020 1035   MCV 92 12/09/2020 1035   MCV 95.1 05/15/2007 1507   MCH 31.9 12/09/2020 1035   MCH 32.1 09/26/2019 1420   MCHC 34.7 12/09/2020 1035   MCHC 34.6 09/26/2019 1420   RDW 12.2 12/09/2020 1035   RDW 12.2 05/15/2007 1507   LYMPHSABS 2.2 09/26/2019 1420   LYMPHSABS 3.4 (H) 05/15/2007 1507   MONOABS 0.6 09/26/2019 1420   MONOABS 0.7 05/15/2007 1507   EOSABS 0.2 09/26/2019 1420   EOSABS 0.2 05/15/2007 1507   BASOSABS 0.0 09/26/2019 1420   BASOSABS 0.0 05/15/2007 1507   BMP Latest Ref Rng & Units  12/18/2020 12/18/2020 12/09/2020  Glucose 65 - 99 mg/dL - - 182(H)  BUN 8 - 27 mg/dL - - 20  Creatinine 0.57 - 1.00 mg/dL - - 0.94  BUN/Creat Ratio 12 - 28 - - 21  Sodium 135 - 145 mmol/L 142 141 141  Potassium 3.5 - 5.1 mmol/L 2.7(LL) 2.7(LL) 3.6  Chloride 96 - 106 mmol/L - - 98  CO2 20 - 29 mmol/L - - 24  Calcium 8.7 - 10.3 mg/dL - - 9.7   Chest imaging: CXR 01/01/21 Kyphosis present. No opacities or pleural effusions noted. Mildly elevated left hemidiaphragm. Heart border appears normal.  PFT: PFT Results Latest Ref Rng & Units 03/04/2021  FVC-Pre L 1.88  FVC-Predicted Pre % 80  FVC-Post L 1.93  FVC-Predicted Post % 82  Pre FEV1/FVC % % 76  Post FEV1/FCV % % 79  FEV1-Pre L 1.43  FEV1-Predicted Pre % 82  FEV1-Post L 1.53  DLCO uncorrected ml/min/mmHg 12.70  DLCO UNC% % 74  DLCO corrected ml/min/mmHg 12.70  DLCO COR %Predicted % 74  DLVA Predicted % 74  TLC L 4.38  TLC % Predicted % 95  RV % Predicted % 106    Echo 10/22/20: Normal LV systolic function with visual EF 50-55%. Left ventricle cavity  is normal in size. Normal global wall motion. Asymmetric septal bulge.  Indeterminate diastolic filling pattern, elevated LAP.  Mild calcification of the aortic valve annulus. Moderate aortic valve  leaflet calcification, mostly of the non coronary cusp with mildly  restricted aortic valve leaflets.  Suspect mild to moderate aortic valve stenosis (AVA 1.16cm2, DI 0.4) but  MG 8.67mmHg and peak velocity 1.51m/s.  No aortic valve regurgitation noted.  Mild mitral valve leaflet thickening with mild calcification. Trace mitral  regurgitation.   Heart Catheterization 12/18/20: RA: 8 mmHg RV: 36/1 mmHg PA: 33/17 mmHg, mPAP 23 mmHg PCW: 11 mmHg LV end diastolic pressure is normal. LV 131/2 mmHg LVEDP 7 mmHg LV-Ao peak-to-peak gradient 11 mmHg, mean PG 9 mmHg CO: 6.7 L/min CI: 3.5 L/min/m2  Assessment & Plan:   Decreased diffusion capacity of lung - Plan:  Budeson-Glycopyrrol-Formoterol (BREZTRI AEROSPHERE) 160-9-4.8 MCG/ACT AERO  Pulmonary hypertension (HCC) - Plan: Polysomnography 4 or more parameters (NPSG), Anti-DNA antibody, double-stranded, Anti-Smith antibody, Centromere Antibodies, Cyclic citrul peptide antibody, IgG, Rheumatoid factor, RNP Antibodies, Sjogren's syndrome antibods(ssa + ssb), ANCA screen with reflex titer, ANA, Anti-scleroderma antibody  Shortness of breath - Plan: CT Chest High Resolution  Discussion: Oswin Johal is a 80 year old woman, former smoker with history of breast cancer s/p chemotherapy/radiation, NASH Cirrhosis, GERD, and aortic stenosis who returns to pulmonary clinic for shortness of breath.  She has isolated diffusion defect that is mild on pulmonary function test today.  This may be related to her mild degree of pulmonary hypertension as noted on right heart cath from 12/2020.  We will check a sleep study at [facility] if there is any sleep disordered breathing that could be contributing to her pulmonary hypertension. We will also check a high resolution CT chest for evaluation of interstitial lung disease. We will also check inflammatory labs based on her concern for rheumatoid arthritis.   She is to start Breztri inhaler, 2 puffs twice daily and stop using Wixela.  She is going discuss a referral to gastroenterology with her primary care for further evaluation of her liver disease.  Freda Jackson, MD [facility] Office: [phone]   Current Outpatient Medications:    albuterol (VENTOLIN HFA) 108 (90 Base) MCG/ACT inhaler, Inhale 2 puffs into the lungs every 4 (four) hours as needed for wheezing or shortness of breath., Disp: , Rfl:    amitriptyline (ELAVIL) 25 MG tablet, Take 2 tablets (50 mg total) by mouth at bedtime., Disp: 180 tablet, Rfl: 1   Budeson-Glycopyrrol-Formoterol (BREZTRI AEROSPHERE) 160-9-4.8 MCG/ACT AERO, Inhale 2 puffs into the lungs in the morning  and at bedtime., Disp: 10.7 g, Rfl: 6   calcium carbonate (OS-CAL) 600 MG tablet, Take 600 mg by mouth daily., Disp: , Rfl:    chlorthalidone (HYGROTON) 50 MG tablet, TAKE 1 TABLET BY MOUTH EVERY DAY (Patient taking differently: Take 50 mg by mouth daily.), Disp: 90 tablet, Rfl: 1   Cholecalciferol (D-3-5) 125 MCG (5000 UT) capsule, Take 10,000 Units by mouth daily., Disp: , Rfl:    diltiazem (CARDIZEM CD) 240 MG 24 hr capsule, Take 1 capsule (240 mg total) by mouth daily., Disp: 90 capsule, Rfl: 0   dimenhyDRINATE (DRAMAMINE) 50 MG tablet, Take 50 mg by mouth every 8 (eight) hours as needed for nausea., Disp: , Rfl:    Magnesium Oxide 400 MG CAPS, Take 1 capsule (400 mg total) by mouth daily., Disp: 30 capsule, Rfl: 2   Melatonin 10 MG TABS, Take 10 mg by mouth at bedtime., Disp: , Rfl:    metFORMIN (GLUCOPHAGE) 500 MG tablet, Take 1 tablet (500 mg total) by mouth 2 (two) times daily with a meal., Disp: 180 tablet, Rfl: 1   Multiple Vitamins-Minerals (ONE-A-DAY WOMENS  50+ ADVANTAGE PO), Take 1 tablet by mouth daily., Disp: , Rfl:    rosuvastatin (CRESTOR) 10 MG tablet, TAKE 1 TABLET BY MOUTH EVERYDAY AT BEDTIME, Disp: 90 tablet, Rfl: 1

## 2021-03-04 NOTE — Patient Instructions (Addendum)
Start Breztri inhaler, 2 puffs twice daily -Rinse mouth out after each use  Stop Wixela inhaler while using Breztri  Continue to use albuterol 1 to 2 puffs every 4-6 hours as needed  We will schedule you for a CT chest scan and a sleep study at [facility]  Please talk with your primary care provider about a referral to a GI specialist to evaluate your liver.  We will check inflammatory labs today given your concern for rheumatoid arthritis

## 2021-03-06 LAB — ANA: Anti Nuclear Antibody (ANA): NEGATIVE

## 2021-03-06 LAB — ANCA SCREEN W REFLEX TITER
ANCA Screen: POSITIVE — AB
Atypical P-ANCA titer: 1:40 {titer} — ABNORMAL HIGH

## 2021-03-06 LAB — CYCLIC CITRUL PEPTIDE ANTIBODY, IGG: Cyclic Citrullin Peptide Ab: <16 UNITS

## 2021-03-06 LAB — SJOGREN'S SYNDROME ANTIBODS(SSA + SSB)
SSA (Ro) (ENA) Antibody, IgG: <1 AI
SSB (La) (ENA) Antibody, IgG: <1 AI

## 2021-03-06 LAB — ANTI-SMITH ANTIBODY: ENA SM Ab Ser-aCnc: <1 AI

## 2021-03-06 LAB — ANTI-SCLERODERMA ANTIBODY: Scleroderma (Scl-70) (ENA) Antibody, IgG: <1 AI

## 2021-03-06 LAB — ANTIEXTRACTABLE NUCLEAR AG
ENA RNP Ab: <0.2 AI (ref 0.0–0.9)
ENA SM Ab Ser-aCnc: <0.2 AI (ref 0.0–0.9)

## 2021-03-06 LAB — RHEUMATOID FACTOR: Rheumatoid fact SerPl-aCnc: <14 IU/mL (ref <14)

## 2021-03-06 LAB — CENTROMERE ANTIBODIES: Centromere Ab Screen: <1 AI

## 2021-03-06 LAB — ANTI-DNA ANTIBODY, DOUBLE-STRANDED: ds DNA Ab: <1 IU/mL

## 2021-03-11 DIAGNOSIS — E1169 Type 2 diabetes mellitus with other specified complication: Secondary | ICD-10-CM | POA: Diagnosis not present

## 2021-03-13 ENCOUNTER — Ambulatory Visit (HOSPITAL_COMMUNITY): Payer: Medicare HMO

## 2021-03-16 ENCOUNTER — Encounter: Payer: Medicare HMO | Admitting: Pulmonary Disease

## 2021-03-17 ENCOUNTER — Telehealth: Payer: Self-pay | Admitting: Pulmonary Disease

## 2021-03-17 MED ORDER — ALBUTEROL SULFATE HFA 108 (90 BASE) MCG/ACT IN AERS: 2.0000 | INHALATION_SPRAY | RESPIRATORY_TRACT | 5 refills | Status: DC | PRN

## 2021-03-17 NOTE — Telephone Encounter (Signed)
Call returned to [facility], confirmed patient information. They are requesting refills of albuterol, metoprolol, and Hygroton. I made them aware only the albuterol was prescribed from this office. Refill sent.   Nothing further needed at this time.

## 2021-03-18 ENCOUNTER — Telehealth: Payer: Self-pay | Admitting: Pulmonary Disease

## 2021-03-18 ENCOUNTER — Ambulatory Visit (HOSPITAL_COMMUNITY)
Admission: RE | Admit: 2021-03-18 | Discharge: 2021-03-18 | Disposition: A | Discharge status: Home / Self Care | Payer: Medicare HMO | Source: Ambulatory Visit | Attending: Pulmonary Disease | Admitting: Pulmonary Disease

## 2021-03-18 ENCOUNTER — Other Ambulatory Visit: Payer: Self-pay

## 2021-03-18 DIAGNOSIS — I700 Atherosclerosis of aorta: Secondary | ICD-10-CM | POA: Diagnosis not present

## 2021-03-18 DIAGNOSIS — R0600 Dyspnea, unspecified: Secondary | ICD-10-CM

## 2021-03-18 DIAGNOSIS — R0602 Shortness of breath: Secondary | ICD-10-CM

## 2021-03-18 DIAGNOSIS — R0609 Other forms of dyspnea: Secondary | ICD-10-CM

## 2021-03-18 DIAGNOSIS — I493 Ventricular premature depolarization: Secondary | ICD-10-CM

## 2021-03-18 DIAGNOSIS — R002 Palpitations: Secondary | ICD-10-CM

## 2021-03-18 DIAGNOSIS — R942 Abnormal results of pulmonary function studies: Secondary | ICD-10-CM

## 2021-03-18 MED ORDER — BREZTRI AEROSPHERE 160-9-4.8 MCG/ACT IN AERO: 2.0000 | INHALATION_SPRAY | Freq: Two times a day (BID) | RESPIRATORY_TRACT | 6 refills | Status: DC

## 2021-03-18 MED ORDER — DILTIAZEM HCL ER COATED BEADS 240 MG PO CP24: 240.0000 mg | ORAL_CAPSULE | Freq: Every day | ORAL | 0 refills | Status: DC

## 2021-03-18 MED ORDER — CHLORTHALIDONE 50 MG PO TABS: 50.0000 mg | ORAL_TABLET | Freq: Every day | ORAL | 1 refills | Status: DC

## 2021-03-18 NOTE — Telephone Encounter (Signed)
[facility] is requesting a refill for the rx; Budeson-Glycopyrrol-Formoterol (BREZTRI AEROSPHERE) 160-9-4.8 MCG/ACT AERO. Pls regard; [phone]  Pharmacy;   [address], [place], [place] [address]

## 2021-03-18 NOTE — Telephone Encounter (Signed)
Spoke with preservative who stated Breztri refill needed to be sent to [facility]. Refill was sent to [facility]. Nothing further needed at this time.

## 2021-03-23 ENCOUNTER — Other Ambulatory Visit: Payer: Self-pay | Admitting: Cardiology

## 2021-03-23 DIAGNOSIS — R002 Palpitations: Secondary | ICD-10-CM

## 2021-03-23 DIAGNOSIS — R0609 Other forms of dyspnea: Secondary | ICD-10-CM

## 2021-03-23 DIAGNOSIS — I493 Ventricular premature depolarization: Secondary | ICD-10-CM

## 2021-03-23 DIAGNOSIS — R0600 Dyspnea, unspecified: Secondary | ICD-10-CM

## 2021-03-24 ENCOUNTER — Telehealth: Payer: Self-pay | Admitting: Pulmonary Disease

## 2021-03-24 NOTE — Telephone Encounter (Signed)
ATC patient, LMTCB  I do not see any results on CT scan from Dr. Erin Fulling. He is not back in until 03/30/2021.

## 2021-03-26 ENCOUNTER — Ambulatory Visit (HOSPITAL_COMMUNITY): Payer: Medicare HMO

## 2021-03-26 NOTE — Telephone Encounter (Signed)
Called and left detailed message that we have sent message to Dr. Erin Fulling to result the CT scan and will call her back once results are available.    Dr. Erin Fulling, please advise on HRCT from 7/13. Thanks.

## 2021-03-30 NOTE — Telephone Encounter (Signed)
Please let the patient know that her CT chest scan does not show any concerning signs for interstitial lung disease as this was the main concern for performing the scan based on her pulmonary function test.  The abnormality in her pulmonary function test is most likely related to her pulmonary hypertension.  Thanks,  Wille Glaser

## 2021-03-31 DIAGNOSIS — D61818 Other pancytopenia: Secondary | ICD-10-CM | POA: Diagnosis not present

## 2021-03-31 DIAGNOSIS — K219 Gastro-esophageal reflux disease without esophagitis: Secondary | ICD-10-CM | POA: Diagnosis not present

## 2021-03-31 DIAGNOSIS — E1169 Type 2 diabetes mellitus with other specified complication: Secondary | ICD-10-CM | POA: Diagnosis not present

## 2021-03-31 DIAGNOSIS — R971 Elevated cancer antigen 125 [CA 125]: Secondary | ICD-10-CM | POA: Diagnosis not present

## 2021-03-31 DIAGNOSIS — E7800 Pure hypercholesterolemia, unspecified: Secondary | ICD-10-CM | POA: Diagnosis not present

## 2021-03-31 DIAGNOSIS — I10 Essential (primary) hypertension: Secondary | ICD-10-CM | POA: Diagnosis not present

## 2021-03-31 DIAGNOSIS — E785 Hyperlipidemia, unspecified: Secondary | ICD-10-CM | POA: Diagnosis not present

## 2021-03-31 DIAGNOSIS — Z853 Personal history of malignant neoplasm of breast: Secondary | ICD-10-CM | POA: Diagnosis not present

## 2021-03-31 DIAGNOSIS — J449 Chronic obstructive pulmonary disease, unspecified: Secondary | ICD-10-CM | POA: Diagnosis not present

## 2021-03-31 DIAGNOSIS — Z87448 Personal history of other diseases of urinary system: Secondary | ICD-10-CM | POA: Diagnosis not present

## 2021-03-31 DIAGNOSIS — F329 Major depressive disorder, single episode, unspecified: Secondary | ICD-10-CM | POA: Diagnosis not present

## 2021-03-31 NOTE — Telephone Encounter (Signed)
Called and spoke to pt. Informed her results per Dr. Erin Fulling. Pt verbalized understanding and denied any further questions or concerns at this time.

## 2021-04-01 ENCOUNTER — Other Ambulatory Visit: Payer: Self-pay | Admitting: Cardiology

## 2021-04-01 ENCOUNTER — Telehealth: Payer: Self-pay

## 2021-04-01 NOTE — Telephone Encounter (Signed)
Please follow up with them next week to make sure this is resolved.  She will have potassium supplemented by her PCP and have asked her to follow up w/ PCP a week later to recheck her potassium level.

## 2021-04-02 ENCOUNTER — Telehealth: Payer: Self-pay | Admitting: Pulmonary Disease

## 2021-04-02 NOTE — Telephone Encounter (Signed)
I have called the [place] office and they have the fax number up front to send the pt assistance form to.  Nothing further is needed.

## 2021-04-08 NOTE — Telephone Encounter (Signed)
Received patient assistance forms for [facility]. The forms have been signed by JD. Will fax the forms to [employer].   Nothing further needed.

## 2021-04-09 DIAGNOSIS — I10 Essential (primary) hypertension: Secondary | ICD-10-CM | POA: Diagnosis not present

## 2021-04-10 DIAGNOSIS — J449 Chronic obstructive pulmonary disease, unspecified: Secondary | ICD-10-CM | POA: Diagnosis not present

## 2021-04-10 DIAGNOSIS — R42 Dizziness and giddiness: Secondary | ICD-10-CM | POA: Diagnosis not present

## 2021-04-10 DIAGNOSIS — E1169 Type 2 diabetes mellitus with other specified complication: Secondary | ICD-10-CM | POA: Diagnosis not present

## 2021-04-10 DIAGNOSIS — E878 Other disorders of electrolyte and fluid balance, not elsewhere classified: Secondary | ICD-10-CM | POA: Diagnosis not present

## 2021-04-10 DIAGNOSIS — E669 Obesity, unspecified: Secondary | ICD-10-CM | POA: Diagnosis not present

## 2021-04-17 ENCOUNTER — Other Ambulatory Visit: Payer: Self-pay

## 2021-04-17 ENCOUNTER — Encounter: Payer: Self-pay | Admitting: Cardiology

## 2021-04-17 ENCOUNTER — Ambulatory Visit: Payer: Medicare HMO | Admitting: Cardiology

## 2021-04-17 VITALS — BP 149/75 | HR 88 | Temp 98.0°F | Resp 16 | Ht 61.0 in | Wt 197.0 lb

## 2021-04-17 DIAGNOSIS — R0609 Other forms of dyspnea: Secondary | ICD-10-CM | POA: Diagnosis not present

## 2021-04-17 DIAGNOSIS — I471 Supraventricular tachycardia, unspecified: Secondary | ICD-10-CM

## 2021-04-17 DIAGNOSIS — K7460 Unspecified cirrhosis of liver: Secondary | ICD-10-CM

## 2021-04-17 DIAGNOSIS — R0600 Dyspnea, unspecified: Secondary | ICD-10-CM

## 2021-04-17 DIAGNOSIS — Z853 Personal history of malignant neoplasm of breast: Secondary | ICD-10-CM

## 2021-04-17 DIAGNOSIS — E119 Type 2 diabetes mellitus without complications: Secondary | ICD-10-CM | POA: Diagnosis not present

## 2021-04-17 DIAGNOSIS — I5032 Chronic diastolic (congestive) heart failure: Secondary | ICD-10-CM | POA: Diagnosis not present

## 2021-04-17 DIAGNOSIS — J449 Chronic obstructive pulmonary disease, unspecified: Secondary | ICD-10-CM

## 2021-04-17 DIAGNOSIS — Z87891 Personal history of nicotine dependence: Secondary | ICD-10-CM | POA: Diagnosis not present

## 2021-04-17 DIAGNOSIS — I350 Nonrheumatic aortic (valve) stenosis: Secondary | ICD-10-CM | POA: Diagnosis not present

## 2021-04-17 DIAGNOSIS — R002 Palpitations: Secondary | ICD-10-CM

## 2021-04-17 DIAGNOSIS — I493 Ventricular premature depolarization: Secondary | ICD-10-CM | POA: Diagnosis not present

## 2021-04-17 MED ORDER — EMPAGLIFLOZIN 10 MG PO TABS: 10.0000 mg | ORAL_TABLET | Freq: Every day | ORAL | 0 refills | Status: DC

## 2021-04-17 NOTE — Progress Notes (Signed)
[name] [name] Date of Birth: 1940/11/26 MRN: [id number] Primary Care Provider:Miller, Lattie Haw, MD Former Cardiology Providers: Jeri Lager, APRN, FNP-C Primary Cardiologist: Rex Kras, DO, [facility] (established care 05/06/2020)  Date: 04/17/21 Last Office Visit: 01/01/2021   Chief Complaint  Patient presents with   Dyspnea on exertion   Follow-up   HPI  [name] [name] is a 80 y.o.  female who presents to the office with a chief complaint of " follow up for shortness of breath." Patient's past medical history and cardiovascular risk factors include: hypertension, hyperlipidemia, HFpEF/stage B/ NYHA II, COPD, OA, depression, history of breast cancer s/p lumpectomy in 2005 and radiation and chemotherapy in [id number], non-alcoholic cirrhosis of liver in 2015, and former tobacco use.  Patient is being followed by the clinic for management/history of HFpEF.  In the recent past she has been having worsening shortness of breath therefore underwent left/right heart catheterization.  Angiographically noted to have normal epicardial coronary arteries, mild aortic stenosis, and borderline pulmonary hypertension with a mean PAP of 23 mmHg most likely secondary to WHO group 3 disease.  She comes today for 32-monthfollow-up for reevaluation of dyspnea.  Patient states that she is feeling much better compared to the last office visit.  Her shortness of breath is now more present with effort related activities and no symptoms at baseline/resting.  No hospitalizations or urgent care visits since last office encounter.  Patient has lost approximately 7 pounds due to dietary and increasing physical activity.  She has started to use a stationary bike 5 minutes 3 times a day.  Patient states that she has an upcoming sleep study on 05/13/2021 and a follow-up PCP appointment on 05/22/2021.  FUNCTIONAL STATUS: No structured exercise program or daily routine.   ALLERGIES: No Known Allergies   MEDICATION LIST PRIOR TO  VISIT: Current Outpatient Medications on File Prior to Visit  Medication Sig Dispense Refill   albuterol (VENTOLIN HFA) 108 (90 Base) MCG/ACT inhaler Inhale 2 puffs into the lungs every 4 (four) hours as needed for wheezing or shortness of breath. 6.7 g 5   amitriptyline (ELAVIL) 25 MG tablet Take 2 tablets (50 mg total) by mouth at bedtime. 180 tablet 1   bisacodyl (DULCOLAX) 5 MG EC tablet Take 5 mg by mouth daily as needed for moderate constipation.     Budeson-Glycopyrrol-Formoterol (BREZTRI AEROSPHERE) 160-9-4.8 MCG/ACT AERO Inhale 2 puffs into the lungs in the morning and at bedtime. 11.8 g 0   chlorthalidone (HYGROTON) 50 MG tablet Take 1 tablet (50 mg total) by mouth daily. 90 tablet 1   Cholecalciferol (D-3-5) 125 MCG (5000 UT) capsule Take 10,000 Units by mouth daily.     diltiazem (CARDIZEM CD) 240 MG 24 hr capsule TAKE 1 CAPSULE EVERY DAY 90 capsule 0   dimenhyDRINATE (DRAMAMINE) 50 MG tablet Take 50 mg by mouth every 8 (eight) hours as needed for nausea.     glipiZIDE (GLUCOTROL XL) 10 MG 24 hr tablet Take 10 mg by mouth daily with breakfast.     magnesium oxide (MAG-OX) 400 MG tablet Take 400 mg by mouth daily.     melatonin 3 MG TABS tablet Take 6 mg by mouth at bedtime.     metFORMIN (GLUCOPHAGE) 500 MG tablet Take 1 tablet (500 mg total) by mouth 2 (two) times daily with a meal. (Patient taking differently: Take 2 tablets by mouth 2 (two) times daily with a meal.) 180 tablet 1   Multiple Vitamins-Minerals (ONE-A-DAY WOMENS 50+ ADVANTAGE PO) Take  1 tablet by mouth daily.     potassium chloride (MICRO-K) 10 MEQ CR capsule Take 10 mEq by mouth 2 (two) times daily.     rosuvastatin (CRESTOR) 10 MG tablet TAKE 1 TABLET BY MOUTH EVERYDAY AT BEDTIME 90 tablet 1   No current facility-administered medications on file prior to visit.    PAST MEDICAL HISTORY: Past Medical History:  Diagnosis Date   Aortic stenosis    Breast cancer (HCC)    Cancer (HCC)    Chest pain    Chronic  kidney disease    Cirrhosis (HCC)    COPD (chronic obstructive pulmonary disease) (HCC)    DJD (degenerative joint disease)    GERD (gastroesophageal reflux disease)    Hyperlipidemia    Hypertension    Leg edema    Orthopnea    Palpitation    Personal history of radiation therapy    SOB (shortness of breath)    Vitamin D deficiency     PAST SURGICAL HISTORY: Past Surgical History:  Procedure Laterality Date   BREAST BIOPSY     BREAST LUMPECTOMY Left    CARDIAC CATHETERIZATION     CORONARY ANGIOPLASTY     LAPAROSCOPIC TOTAL HYSTERECTOMY     RIGHT/LEFT HEART CATH AND CORONARY ANGIOGRAPHY N/A 12/18/2020   Procedure: RIGHT/LEFT HEART CATH AND CORONARY ANGIOGRAPHY;  Surgeon: Nigel Mormon, MD;  Location: [place] [facility];  Service: Cardiovascular;  Laterality: N/A;    FAMILY HISTORY: The patient's family history includes Cardiomyopathy in her father; Heart attack in her mother; Stroke in her mother.   SOCIAL HISTORY:  The patient  reports that she quit smoking about 17 years ago. Her smoking use included cigarettes. She started smoking about 60 years ago. She has a 15.00 pack-year smoking history. She has never used smokeless tobacco. She reports that she does not drink alcohol and does not use drugs.  Review of Systems  Constitutional: Negative for chills, fever and weight loss.  HENT:  Negative for hoarse voice and nosebleeds.   Eyes:  Negative for discharge, double vision and pain.  Cardiovascular:  Positive for dyspnea on exertion (chronic and stable). Negative for chest pain, claudication, leg swelling, near-syncope, orthopnea, palpitations, paroxysmal nocturnal dyspnea and syncope.  Respiratory:  Positive for snoring. Negative for hemoptysis and shortness of breath.   Musculoskeletal:  Positive for back pain and joint pain. Negative for muscle cramps and myalgias.  Gastrointestinal:  Negative for abdominal pain, constipation, diarrhea, hematemesis, hematochezia,  melena, nausea and vomiting.  Neurological:  Negative for dizziness and light-headedness.   PHYSICAL EXAM: Vitals with BMI 04/17/2021 03/04/2021 01/01/2021  Height '5\' 1"'$  '5\' 1"'$  '5\' 3"'$   Weight 197 lbs 203 lbs 10 oz 203 lbs  BMI 37.24 [id number]  Systolic 123456 [id number]  Diastolic 75 64 80  Pulse 88 105 98   CONSTITUTIONAL: Appears older than stated age, hemodynamically stable, conversational dyspnea, well-nourished.   SKIN: Skin is warm and dry. No rash noted. No cyanosis. No pallor. No jaundice HEAD: Normocephalic and atraumatic.  EYES: No scleral icterus MOUTH/THROAT: Moist oral membranes.  NECK: No JVD present. No thyromegaly noted. No carotid bruits  LYMPHATIC: No visible cervical adenopathy.  CHEST Normal respiratory effort. No intercostal retractions  LUNGS: Clear to auscultation bilaterally.  No stridor. No wheezes. No rales.  CARDIOVASCULAR: Regular rate and rhythm, positive Q000111Q, soft holosystolic murmur at the apex, no gallops or rubs. ABDOMINAL: Obese, soft, nontender, nondistended, positive bowel sounds all 4 quadrants, no apparent ascites.  EXTREMITIES: No peripheral edema  HEMATOLOGIC: No significant bruising NEUROLOGIC: Oriented to person, place, and time. Nonfocal. Normal muscle tone.  PSYCHIATRIC: Normal mood and affect. Normal behavior. Cooperative  CARDIAC DATABASE: EKG: 01/01/2021: NSR, 96bpm, LAD, LAFB, old anteroseptal infarct, rare PVCs.  Echocardiogram: 10/22/2020: Normal LV systolic function with visual EF 50-55%. Left ventricle cavity is normal in size. Normal global wall motion. Asymmetric septal bulge. Indeterminate diastolic filling pattern, elevated LAP.  Mild calcification of the aortic valve annulus. Moderate aortic valve leaflet calcification, mostly of the non coronary cusp with mildly restricted aortic valve leaflets.  Suspect mild to moderate aortic valve stenosis (AVA 1.16cm2, DI 0.4) but MG 8.64mHg and peak velocity 1.932m.  No aortic valve  regurgitation noted.  Mild mitral valve leaflet thickening with mild calcification. Trace mitral regurgitation.  Recommend: Limited echo to re-evaluate the aortic valve hemodynamics via various acoustic windows. Clinical correlation is required.  Compared to prior study dated LVEF was 64% and now 50-55% otherwise no significant change noted.   Stress Testing:  Lexiscan Myoview stress test 03/12/2019: Lexiscan stress test was performed. Stress EKG is non-diagnostic, as this is pharmacological stress test. Normal myocardial perfusion, with mild uniform breast tissue attenuation in inferior myocardium. LVEF 53%. Low risk study.   Heart Catheterization: 12/18/2020: Angiographically normal coronary arteries No coronary artery disease Mild aortic stenosis, mean PG 9 mmHg Likely etiology of dyspnea is COPD and borderline PH (mPAP 23 mmHg), likely WHO Grp III  Carotid artery duplex  03/19/2019: No hemodynamically significant stenosis noted in bilateral internal carotid arteries.  Minimal heterogeneous plaque noted in the left ICA.   The right CCA velocity is minimally elevated but no significant plaque noted. Antegrade right vertebral artery flow. Antegrade left vertebral artery flow.  14 day extended Holter monitor: Dominant rhythm normal sinus rhythm.  Heart rate 58-187 bpm.  Avg HR 83 bpm. No atrial fibrillation, ventricular tachycardia, high grade AV block, pauses (3 seconds or longer). Total ventricular ectopic burden <1%. Total supraventricular ectopic burden <1%. Patient triggered events: 4.  Predominately normal sinus rhythm with one episode of SVT and occasional ventricular ectopy.   LABORATORY DATA: CBC Latest Ref Rng & Units 12/18/2020 12/18/2020 12/09/2020  WBC 3.4 - 10.8 x10E3/uL - - 5.7  Hemoglobin 12.0 - 15.0 g/dL 11.6(L) 11.9(L) 14.6  Hematocrit 36.0 - 46.0 % 34.0(L) 35.0(L) 42.1  Platelets 150 - 450 x10E3/uL - - 127(L)    CMP Latest Ref Rng & Units 12/18/2020 12/18/2020  12/09/2020  Glucose 65 - 99 mg/dL - - 182(H)  BUN 8 - 27 mg/dL - - 20  Creatinine 0.57 - 1.00 mg/dL - - 0.94  Sodium 135 - 145 mmol/L 142 141 141  Potassium 3.5 - 5.1 mmol/L 2.7(LL) 2.7(LL) 3.6  Chloride 96 - 106 mmol/L - - 98  CO2 20 - 29 mmol/L - - 24  Calcium 8.7 - 10.3 mg/dL - - 9.7  Total Protein 6.5 - 8.1 g/dL - - -  Total Bilirubin 0.3 - 1.2 mg/dL - - -  Alkaline Phos 38 - 126 U/L - - -  AST 15 - 41 U/L - - -  ALT 0 - 44 U/L - - -    Lipid Panel     Component Value Date/Time   CHOL 166 12/09/2020 1035   TRIG 125 12/09/2020 1035   HDL 47 12/09/2020 1035   LDLCALC 97 12/09/2020 1035   LDLDIRECT 104 (H) 12/09/2020 1035   LABVLDL 22 12/09/2020 1035    No results found for:  HGBA1C No components found for: NTPROBNP No results found for: TSH  Cardiac Panel (last 3 results) No results for input(s): CKTOTAL, CKMB, TROPONINIHS, RELINDX in the last 72 hours.   External Labs: Collected: 04/09/2021 provided by PCP Creatinine 1.13 mg/dL. Sodium 132, potassium 3.2, chloride 94, bicarb 25 AST 32, ALT 27, alkaline phosphatase 54 Hemoglobin A1c 9.5 Total cholesterol 121, triglycerides 150, HDL 53, 8 LDL 43, non-HDL 68  IMPRESSION:    ICD-10-CM   1. Chronic heart failure with preserved ejection fraction (HFpEF) (HCC)  I50.32 empagliflozin (JARDIANCE) 10 MG TABS tablet    Basic metabolic panel    Magnesium    Pro b natriuretic peptide (BNP)    2. Dyspnea on exertion  R06.00     3. Non-insulin dependent type 2 diabetes mellitus ([place])  E11.9     4. Chronic obstructive pulmonary disease, unspecified COPD type ([place])  J44.9     5. Non-alcoholic cirrhosis ([place])  K74.60     6. Hx of breast cancer  Z85.3     7. Former smoker  Z87.891     55. Mild aortic stenosis  I35.0     9. PVC's (premature ventricular contractions)  I49.3     10. Palpitations  R00.2     11. PSVT (paroxysmal supraventricular tachycardia) (HCC)  I47.1        RECOMMENDATIONS: [name] [name] is a 80  y.o. female whose past medical history and cardiovascular risk factors include: hypertension, hyperlipidemia, non-insulin-dependent diabetes mellitus type 2, HFpEF/stage B/ NYHA II, COPD, OA, depression, history of breast cancer s/p lumpectomy in 2005 and radiation and chemotherapy in 123456, non-alcoholic cirrhosis of liver in 2015, and former tobacco use.  Chronic heart failure with preserved EF: Diagnosed prior to establishing care with myself in August 2021. Currently euvolemic No recent hospitalizations for congestive heart failure. When evaluating her medications patient is not on GDMT.  And most recent labs performed by PCP note hyponatremia and hypokalemia which I suspect is secondary to chlorthalidone.  Chlorthalidone is currently being prescribed by her PCP.  I have discussed with her with regards to transitioning her to [facility] and spironolactone. The office also tried to reach out to the pharmacist at her PCPs office to discuss this further -awaiting their response. Given her diabetes and HFpEF will start SGL 2 inhibitors, Jardiance 10 mg p.o. daily. I have given her a 7-day sample and asked her to blood work in 1 week.   If her labs remain stable patient will be instructed to fill her Jardiance prescription.  Dyspnea on exertion:  Improved compared to last office visit. Multifactorial: COPD, chronic HFpEF, former tobacco use, WHO class III PH.  Appears euvolemic. Recommend up titration of medical therapy to maximally tolerated GDMT Has an upcoming appointment with to see sleep medicine in September 2022.  Mixed hyperlipidemia: Tolerating statin therapy without any side effects or intolerances. Outside labs independently reviewed and LDL is less than 70 mg/dL as of August 2022. Continue to monitor.  Palpitations: Resolved since being on Cardizem to 40 mg p.o. daily.  Aortic stenosis: Mild, based on invasive hemodynamics Repeat echocardiogram scheduled for February 2023 Denies  any symptoms of angina pectoris, heart failure, or syncope. We will continue to monitor  Benign essential hypertension: Blood pressure currently at goal.  Medications reconciled.  Currently managed by primary care provider.  Former smoker: 23 year pack history of smoking.  Educated on the importance of continued smoking cessation.  FINAL MEDICATION LIST END OF ENCOUNTER: Meds ordered  this encounter  Medications   empagliflozin (JARDIANCE) 10 MG TABS tablet    Sig: Take 1 tablet (10 mg total) by mouth daily before breakfast.    Dispense:  90 tablet    Refill:  0    Current Outpatient Medications:    albuterol (VENTOLIN HFA) 108 (90 Base) MCG/ACT inhaler, Inhale 2 puffs into the lungs every 4 (four) hours as needed for wheezing or shortness of breath., Disp: 6.7 g, Rfl: 5   amitriptyline (ELAVIL) 25 MG tablet, Take 2 tablets (50 mg total) by mouth at bedtime., Disp: 180 tablet, Rfl: 1   bisacodyl (DULCOLAX) 5 MG EC tablet, Take 5 mg by mouth daily as needed for moderate constipation., Disp: , Rfl:    Budeson-Glycopyrrol-Formoterol (BREZTRI AEROSPHERE) 160-9-4.8 MCG/ACT AERO, Inhale 2 puffs into the lungs in the morning and at bedtime., Disp: 11.8 g, Rfl: 0   chlorthalidone (HYGROTON) 50 MG tablet, Take 1 tablet (50 mg total) by mouth daily., Disp: 90 tablet, Rfl: 1   Cholecalciferol (D-3-5) 125 MCG (5000 UT) capsule, Take 10,000 Units by mouth daily., Disp: , Rfl:    diltiazem (CARDIZEM CD) 240 MG 24 hr capsule, TAKE 1 CAPSULE EVERY DAY, Disp: 90 capsule, Rfl: 0   dimenhyDRINATE (DRAMAMINE) 50 MG tablet, Take 50 mg by mouth every 8 (eight) hours as needed for nausea., Disp: , Rfl:    empagliflozin (JARDIANCE) 10 MG TABS tablet, Take 1 tablet (10 mg total) by mouth daily before breakfast., Disp: 90 tablet, Rfl: 0   glipiZIDE (GLUCOTROL XL) 10 MG 24 hr tablet, Take 10 mg by mouth daily with breakfast., Disp: , Rfl:    magnesium oxide (MAG-OX) 400 MG tablet, Take 400 mg by mouth daily., Disp: ,  Rfl:    melatonin 3 MG TABS tablet, Take 6 mg by mouth at bedtime., Disp: , Rfl:    metFORMIN (GLUCOPHAGE) 500 MG tablet, Take 1 tablet (500 mg total) by mouth 2 (two) times daily with a meal. (Patient taking differently: Take 2 tablets by mouth 2 (two) times daily with a meal.), Disp: 180 tablet, Rfl: 1   Multiple Vitamins-Minerals (ONE-A-DAY WOMENS 50+ ADVANTAGE PO), Take 1 tablet by mouth daily., Disp: , Rfl:    potassium chloride (MICRO-K) 10 MEQ CR capsule, Take 10 mEq by mouth 2 (two) times daily., Disp: , Rfl:    rosuvastatin (CRESTOR) 10 MG tablet, TAKE 1 TABLET BY MOUTH EVERYDAY AT BEDTIME, Disp: 90 tablet, Rfl: 1  Orders Placed This Encounter  Procedures   Basic metabolic panel   Magnesium   Pro b natriuretic peptide (BNP)   --Continue cardiac medications as reconciled in final medication list. --Return in about 5 weeks (around 05/22/2021) for Dyspnea , heart failure management, labs before. Or sooner if needed. --Continue follow-up with your primary care physician regarding the management of your other chronic comorbid conditions.  Patient's questions and concerns were addressed to her satisfaction. She voices understanding of the instructions provided during this encounter.   This note was created using a voice recognition software as a result there may be grammatical errors inadvertently enclosed that do not reflect the nature of this encounter. Every attempt is made to correct such errors.  Rex Kras, [place], [facility]  Pager: [phone] Office: [phone]

## 2021-04-22 ENCOUNTER — Telehealth: Payer: Self-pay | Admitting: Cardiology

## 2021-04-22 NOTE — Telephone Encounter (Signed)
Pt called wanting to let a med assistant know that she received an approval for aznme (prescription savings program); in addition, her blood sugar is improving. Pt wanted to make clinical aware of the improvement.

## 2021-04-22 NOTE — Telephone Encounter (Signed)
Message sent from the front, Pt called wanting to let a med assistant know that she received an approval for aznme (prescription savings program); in addition, her blood sugar is improving. Pt wanted to make clinical aware of the improvement.

## 2021-04-22 NOTE — Telephone Encounter (Signed)
Okay 

## 2021-04-27 DIAGNOSIS — R0600 Dyspnea, unspecified: Secondary | ICD-10-CM | POA: Diagnosis not present

## 2021-04-27 DIAGNOSIS — E878 Other disorders of electrolyte and fluid balance, not elsewhere classified: Secondary | ICD-10-CM | POA: Diagnosis not present

## 2021-04-27 NOTE — Telephone Encounter (Signed)
Spoke to patient she is currently taking potassium but has not recheck levels patient needs to stop by her PCP today for some medicine she will mention it to them.

## 2021-04-28 LAB — BASIC METABOLIC PANEL WITH GFR
BUN/Creatinine Ratio: 19 (ref 12–28)
BUN: 18 mg/dL (ref 8–27)
CO2: 21 mmol/L (ref 20–29)
Calcium: 10 mg/dL (ref 8.7–10.3)
Chloride: 103 mmol/L (ref 96–106)
Creatinine, Ser: 0.94 mg/dL (ref 0.57–1.00)
Glucose: 90 mg/dL (ref 65–99)
Potassium: 4.2 mmol/L (ref 3.5–5.2)
Sodium: 139 mmol/L (ref 134–144)
eGFR: 62 mL/min/1.73 (ref >59)

## 2021-04-28 LAB — MAGNESIUM: Magnesium: 1.7 mg/dL (ref 1.6–2.3)

## 2021-04-28 LAB — PRO B NATRIURETIC PEPTIDE: NT-Pro BNP: 117 pg/mL (ref 0–738)

## 2021-04-30 NOTE — Progress Notes (Signed)
Tried calling patient no answer left a vm

## 2021-04-30 NOTE — Telephone Encounter (Signed)
Labs from 04/27/2021 reviewed potassium and Cr are at basline continue current medical therapy.

## 2021-05-01 NOTE — Progress Notes (Signed)
So she was off chlorthalidone and taking jardiance and potassium prior to these labs. If so continue.

## 2021-05-01 NOTE — Progress Notes (Signed)
Yes sir.  

## 2021-05-01 NOTE — Progress Notes (Signed)
Spoke to patient she is taking jardiance and was on it when she went for her labs she has samples to last her about a month and chlorthalidone she isnt taking it and is still taking potassium

## 2021-05-04 DIAGNOSIS — E1169 Type 2 diabetes mellitus with other specified complication: Secondary | ICD-10-CM | POA: Diagnosis not present

## 2021-05-04 DIAGNOSIS — K219 Gastro-esophageal reflux disease without esophagitis: Secondary | ICD-10-CM | POA: Diagnosis not present

## 2021-05-04 DIAGNOSIS — J449 Chronic obstructive pulmonary disease, unspecified: Secondary | ICD-10-CM | POA: Diagnosis not present

## 2021-05-04 DIAGNOSIS — Z853 Personal history of malignant neoplasm of breast: Secondary | ICD-10-CM | POA: Diagnosis not present

## 2021-05-04 DIAGNOSIS — F329 Major depressive disorder, single episode, unspecified: Secondary | ICD-10-CM | POA: Diagnosis not present

## 2021-05-04 DIAGNOSIS — D61818 Other pancytopenia: Secondary | ICD-10-CM | POA: Diagnosis not present

## 2021-05-04 DIAGNOSIS — E785 Hyperlipidemia, unspecified: Secondary | ICD-10-CM | POA: Diagnosis not present

## 2021-05-04 DIAGNOSIS — I10 Essential (primary) hypertension: Secondary | ICD-10-CM | POA: Diagnosis not present

## 2021-05-04 DIAGNOSIS — E7800 Pure hypercholesterolemia, unspecified: Secondary | ICD-10-CM | POA: Diagnosis not present

## 2021-05-07 ENCOUNTER — Telehealth: Payer: Self-pay

## 2021-05-07 NOTE — Telephone Encounter (Signed)
Have her stop chlorthalidone and potassium supplements. Start spironolactone 25 mg p.o. every morning. Please order and release BMP and magnesium in 1 week after medication changes please call the patient and inform her of these recommendations.

## 2021-05-13 ENCOUNTER — Ambulatory Visit (HOSPITAL_BASED_OUTPATIENT_CLINIC_OR_DEPARTMENT_OTHER): Payer: Medicare HMO | Attending: Pulmonary Disease | Admitting: Pulmonary Disease

## 2021-05-13 ENCOUNTER — Other Ambulatory Visit: Payer: Self-pay

## 2021-05-13 DIAGNOSIS — R0902 Hypoxemia: Secondary | ICD-10-CM | POA: Diagnosis not present

## 2021-05-13 DIAGNOSIS — I272 Pulmonary hypertension, unspecified: Secondary | ICD-10-CM | POA: Diagnosis present

## 2021-05-13 DIAGNOSIS — G4736 Sleep related hypoventilation in conditions classified elsewhere: Secondary | ICD-10-CM | POA: Insufficient documentation

## 2021-05-14 DIAGNOSIS — G4736 Sleep related hypoventilation in conditions classified elsewhere: Secondary | ICD-10-CM

## 2021-05-14 DIAGNOSIS — I272 Pulmonary hypertension, unspecified: Secondary | ICD-10-CM | POA: Diagnosis not present

## 2021-05-14 NOTE — Procedures (Signed)
     Patient Name: [name] [name], [name] [name] Date: 05/13/2021 Gender: Female D.O.B: 04-08-1941 Age (years): 80 Referring Provider: Freda Jackson Height (inches): 34 Interpreting Physician: Chesley Mires MD, ABSM Weight (lbs): 197 RPSGT: Zadie Rhine BMI: 34 MRN: [id number] Neck Size: 14.00  CLINICAL INFORMATION Sleep Study Type: NPSG  Indication for sleep study: Hypertension, Obesity, Snoring, Pulmonary Hypertension.  Epworth Sleepiness Score: 2  SLEEP STUDY TECHNIQUE As per the AASM Manual for the Scoring of Sleep and Associated Events v2.3 (April 2016) with a hypopnea requiring 4% desaturations.  The channels recorded and monitored were frontal, central and occipital EEG, electrooculogram (EOG), submentalis EMG (chin), nasal and oral airflow, thoracic and abdominal wall motion, anterior tibialis EMG, snore microphone, electrocardiogram, and pulse oximetry.  MEDICATIONS Medications self-administered by patient taken the night of the study : MELATONIN, METFORMIN, AMITRIPTYLINE, Rosuvastatin  SLEEP ARCHITECTURE The study was initiated at 10:24:52 PM and ended at 5:01:51 AM.  Sleep onset time was 39.0 minutes and the sleep efficiency was 72.0%%. The total sleep time was 286 minutes.  Stage REM latency was 38.0 minutes.  The patient spent 5.9%% of the night in stage N1 sleep, 65.6%% in stage N2 sleep, 15.9%% in stage N3 and 12.6% in REM.  Alpha intrusion was absent.  Supine sleep was 0.00%.  RESPIRATORY PARAMETERS The overall apnea/hypopnea index (AHI) was 1.9 per hour. There were 2 total apneas, including 1 obstructive, 1 central and 0 mixed apneas. There were 7 hypopneas and 7 RERAs.  The AHI during Stage REM sleep was 6.7 per hour.  AHI while supine was N/A per hour.  The mean oxygen saturation was 88.8%. The minimum SpO2 during sleep was 82.0%.  soft snoring was noted during this study.  CARDIAC DATA The 2 lead EKG demonstrated sinus rhythm. The mean heart  rate was 88.6 beats per minute. Other EKG findings include: None.  LEG MOVEMENT DATA The total PLMS were 0 with a resulting PLMS index of 0.0. Associated arousal with leg movement index was 0.0 .  IMPRESSIONS - No significant obstructive sleep apnea.  Her AHI was 1.9/h. - She had an SpO2 low of 82%.  She spent 107.5 minutes of test time with an SpO2 < 88%.  Supplemental oxygen was not applied during this study. - The patient snored with soft snoring volume.  DIAGNOSIS - Nocturnal Hypoxemia (G47.36)  RECOMMENDATIONS - She should be assessed for supplemental oxygen use at night. - Avoid alcohol, sedatives and other CNS depressants that may worsen sleep apnea and disrupt normal sleep architecture. - Sleep hygiene should be reviewed to assess factors that may improve sleep quality. - Weight management and regular exercise should be initiated or continued if appropriate.  [Electronically signed] 05/14/2021 01:02 PM  Chesley Mires MD, ABSM Diplomate, American Board of Sleep Medicine NPI: [id number]  [place] PH: [phone]   FX: [phone] [place]

## 2021-05-15 ENCOUNTER — Other Ambulatory Visit: Payer: Self-pay

## 2021-05-15 DIAGNOSIS — I5032 Chronic diastolic (congestive) heart failure: Secondary | ICD-10-CM

## 2021-05-15 MED ORDER — SPIRONOLACTONE 25 MG PO TABS: 25.0000 mg | ORAL_TABLET | Freq: Every day | ORAL | 1 refills | Status: DC

## 2021-05-15 NOTE — Telephone Encounter (Signed)
Spoke to patient she is aware of medications changes and MAR has been updated and orders have been released she is aware to go in a wk from starting new med

## 2021-05-21 ENCOUNTER — Other Ambulatory Visit: Payer: Self-pay

## 2021-05-21 ENCOUNTER — Encounter: Payer: Self-pay | Admitting: Cardiology

## 2021-05-21 ENCOUNTER — Ambulatory Visit: Payer: Medicare HMO | Admitting: Cardiology

## 2021-05-21 VITALS — BP 135/67 | HR 78 | Temp 98.5°F | Resp 16 | Ht 62.0 in | Wt 194.6 lb

## 2021-05-21 DIAGNOSIS — K7460 Unspecified cirrhosis of liver: Secondary | ICD-10-CM | POA: Diagnosis not present

## 2021-05-21 DIAGNOSIS — Z853 Personal history of malignant neoplasm of breast: Secondary | ICD-10-CM

## 2021-05-21 DIAGNOSIS — R0609 Other forms of dyspnea: Secondary | ICD-10-CM | POA: Diagnosis not present

## 2021-05-21 DIAGNOSIS — I350 Nonrheumatic aortic (valve) stenosis: Secondary | ICD-10-CM

## 2021-05-21 DIAGNOSIS — I493 Ventricular premature depolarization: Secondary | ICD-10-CM

## 2021-05-21 DIAGNOSIS — I5032 Chronic diastolic (congestive) heart failure: Secondary | ICD-10-CM

## 2021-05-21 DIAGNOSIS — J449 Chronic obstructive pulmonary disease, unspecified: Secondary | ICD-10-CM

## 2021-05-21 DIAGNOSIS — E119 Type 2 diabetes mellitus without complications: Secondary | ICD-10-CM | POA: Diagnosis not present

## 2021-05-21 DIAGNOSIS — I471 Supraventricular tachycardia, unspecified: Secondary | ICD-10-CM

## 2021-05-21 DIAGNOSIS — Z87891 Personal history of nicotine dependence: Secondary | ICD-10-CM | POA: Diagnosis not present

## 2021-05-21 DIAGNOSIS — R0600 Dyspnea, unspecified: Secondary | ICD-10-CM

## 2021-05-21 DIAGNOSIS — R002 Palpitations: Secondary | ICD-10-CM

## 2021-05-21 MED ORDER — SPIRONOLACTONE 25 MG PO TABS: 25.0000 mg | ORAL_TABLET | Freq: Every day | ORAL | 1 refills | Status: DC

## 2021-05-21 NOTE — Progress Notes (Signed)
[name] [name] Date of Birth: [birth year] MRN: [id number] Primary Care Provider:Miller, Lattie Haw, MD Former Cardiology Providers: Jeri Lager, APRN, FNP-C Primary Cardiologist: Rex Kras, DO, [facility] (established care 05/06/2020)  Date: 05/21/21 Last Office Visit: 04/17/2021  Chief Complaint  Patient presents with   Dyspnea on exertion   Chronic heart failure with preserved ejection fraction    Follow-up   HPI  [name] [name] is a 80 y.o.  female who presents to the office with a chief complaint of " 1 month follow-up for management of shortness of breath and heart failure." Patient's past medical history and cardiovascular risk factors include: hypertension, hyperlipidemia, HFpEF/stage B/ NYHA II, COPD, OA, depression, history of breast cancer s/p lumpectomy in 2005 and radiation and chemotherapy in 123456, non-alcoholic cirrhosis of liver in 2015, and former tobacco use.  Being followed for management of HFpEF.  Due to worsening shortness of breath in the recent past underwent left and right heart catheterization.  Angiographically noted to have normal epicardial coronary arteries, mild aortic stenosis, and borderline pulmonary hypertension with a mean PAP of 23 mmHg most likely secondary to WHO class III disease.  Clinically over the last 2 office visits patient's shortness of breath continues to improve with up titration of GDMT.  Since last office visit patient has discontinued chlorthalidone and potassium with intentions of starting spironolactone.  However, patient states that she still has not received the prescription for spironolactone.  She is currently on Jardiance and tolerating the medication well.  She also notes that her serum glucose levels have improved.  She is been evaluated by pulmonary medicine and was found to have no significant sleep apnea but hypoxemia at night for which oxygen is currently being arranged.  Clinically shortness of breath is overall improving chronic and  stable.  She is using her stationary bike at least 5 minutes total daily.  She has lost 10 pounds of weight since April 2022.  FUNCTIONAL STATUS: No structured exercise program or daily routine.   ALLERGIES: No Known Allergies   MEDICATION LIST PRIOR TO VISIT: Current Outpatient Medications on File Prior to Visit  Medication Sig Dispense Refill   albuterol (VENTOLIN HFA) 108 (90 Base) MCG/ACT inhaler Inhale 2 puffs into the lungs every 4 (four) hours as needed for wheezing or shortness of breath. 6.7 g 5   amitriptyline (ELAVIL) 25 MG tablet Take 2 tablets (50 mg total) by mouth at bedtime. (Patient taking differently: Take 25 mg by mouth at bedtime.) 180 tablet 1   Cholecalciferol (D-3-5) 125 MCG (5000 UT) capsule Take 10,000 Units by mouth daily.     diltiazem (CARDIZEM CD) 240 MG 24 hr capsule TAKE 1 CAPSULE EVERY DAY 90 capsule 0   dimenhyDRINATE (DRAMAMINE) 50 MG tablet Take 50 mg by mouth every 8 (eight) hours as needed for nausea.     empagliflozin (JARDIANCE) 10 MG TABS tablet Take 1 tablet (10 mg total) by mouth daily before breakfast. 90 tablet 0   glipiZIDE (GLUCOTROL XL) 10 MG 24 hr tablet Take 10 mg by mouth daily with breakfast.     magnesium oxide (MAG-OX) 400 MG tablet Take 400 mg by mouth daily.     melatonin 3 MG TABS tablet Take 6 mg by mouth at bedtime.     metFORMIN (GLUCOPHAGE) 500 MG tablet Take 1 tablet (500 mg total) by mouth 2 (two) times daily with a meal. (Patient taking differently: Take 2 tablets by mouth 2 (two) times daily with a meal.) 180 tablet 1  Misc Natural Products (APPLE CIDER VINEGAR DIET PO) Take by mouth.     Multiple Vitamins-Minerals (ONE-A-DAY WOMENS 50+ ADVANTAGE PO) Take 1 tablet by mouth daily.     rosuvastatin (CRESTOR) 10 MG tablet TAKE 1 TABLET BY MOUTH EVERYDAY AT BEDTIME 90 tablet 1   No current facility-administered medications on file prior to visit.    PAST MEDICAL HISTORY: Past Medical History:  Diagnosis Date   Aortic stenosis     Breast cancer (HCC)    Cancer (HCC)    Chest pain    Chronic kidney disease    Cirrhosis (HCC)    COPD (chronic obstructive pulmonary disease) (HCC)    DJD (degenerative joint disease)    GERD (gastroesophageal reflux disease)    Hyperlipidemia    Hypertension    Leg edema    Orthopnea    Palpitation    Personal history of radiation therapy    SOB (shortness of breath)    Vitamin D deficiency     PAST SURGICAL HISTORY: Past Surgical History:  Procedure Laterality Date   BREAST BIOPSY     BREAST LUMPECTOMY Left    CARDIAC CATHETERIZATION     CORONARY ANGIOPLASTY     LAPAROSCOPIC TOTAL HYSTERECTOMY     RIGHT/LEFT HEART CATH AND CORONARY ANGIOGRAPHY N/A 12/18/2020   Procedure: RIGHT/LEFT HEART CATH AND CORONARY ANGIOGRAPHY;  Surgeon: Nigel Mormon, MD;  Location: [facility];  Service: Cardiovascular;  Laterality: N/A;    FAMILY HISTORY: The patient's family history includes Cardiomyopathy in her father; Heart attack in her mother; Stroke in her mother.   SOCIAL HISTORY:  The patient  reports that she quit smoking about 17 years ago. Her smoking use included cigarettes. She started smoking about 60 years ago. She has a 15.00 pack-year smoking history. She has never used smokeless tobacco. She reports that she does not drink alcohol and does not use drugs.  Review of Systems  Constitutional: Negative for chills, fever and weight loss.  HENT:  Negative for hoarse voice and nosebleeds.   Eyes:  Negative for discharge, double vision and pain.  Cardiovascular:  Positive for dyspnea on exertion (chronic and stable). Negative for chest pain, claudication, leg swelling, near-syncope, orthopnea, palpitations, paroxysmal nocturnal dyspnea and syncope.  Respiratory:  Positive for snoring. Negative for hemoptysis and shortness of breath.   Musculoskeletal:  Positive for back pain and joint pain. Negative for muscle cramps and myalgias.  Gastrointestinal:  Negative for  abdominal pain, constipation, diarrhea, hematemesis, hematochezia, melena, nausea and vomiting.  Neurological:  Negative for dizziness and light-headedness.   PHYSICAL EXAM: Vitals with BMI 05/21/2021 05/13/2021 04/17/2021  Height '5\' 2"'$  '5\' 2"'$  '5\' 1"'$   Weight 194 lbs 10 oz 197 lbs 197 lbs  BMI 35.58 [id number]  Systolic [id number] - 123456  Diastolic 67 - 75  Pulse 78 - 88   CONSTITUTIONAL: Appears older than stated age, hemodynamically stable, conversational dyspnea, well-nourished.   SKIN: Skin is warm and dry. No rash noted. No cyanosis. No pallor. No jaundice HEAD: Normocephalic and atraumatic.  EYES: No scleral icterus MOUTH/THROAT: Moist oral membranes.  NECK: No JVD present. No thyromegaly noted. No carotid bruits  LYMPHATIC: No visible cervical adenopathy.  CHEST Normal respiratory effort. No intercostal retractions  LUNGS: Clear to auscultation bilaterally.  No stridor. No wheezes. No rales.  CARDIOVASCULAR: Regular rate and rhythm, positive [id number], soft holosystolic murmur at the apex, no gallops or rubs. ABDOMINAL: Obese, soft, nontender, nondistended, positive bowel sounds all 4 quadrants, no  apparent ascites.  EXTREMITIES: No peripheral edema  HEMATOLOGIC: No significant bruising NEUROLOGIC: Oriented to person, place, and time. Nonfocal. Normal muscle tone.  PSYCHIATRIC: Normal mood and affect. Normal behavior. Cooperative  CARDIAC DATABASE: EKG: 01/01/2021: NSR, 96bpm, LAD, LAFB, old anteroseptal infarct, rare PVCs.  Echocardiogram: 10/22/2020: Normal LV systolic function with visual EF 50-55%. Left ventricle cavity is normal in size. Normal global wall motion. Asymmetric septal bulge. Indeterminate diastolic filling pattern, elevated LAP.  Mild calcification of the aortic valve annulus. Moderate aortic valve leaflet calcification, mostly of the non coronary cusp with mildly restricted aortic valve leaflets.  Suspect mild to moderate aortic valve stenosis (AVA 1.16cm2, DI 0.4) but MG  8.44mHg and peak velocity 1.936m.  No aortic valve regurgitation noted.  Mild mitral valve leaflet thickening with mild calcification. Trace mitral regurgitation.  Recommend: Limited echo to re-evaluate the aortic valve hemodynamics via various acoustic windows. Clinical correlation is required.  Compared to prior study dated LVEF was 64% and now 50-55% otherwise no significant change noted.   Stress Testing:  Lexiscan Myoview stress test 03/12/2019: Lexiscan stress test was performed. Stress EKG is non-diagnostic, as this is pharmacological stress test. Normal myocardial perfusion, with mild uniform breast tissue attenuation in inferior myocardium. LVEF 53%. Low risk study.   Heart Catheterization: 12/18/2020: Angiographically normal coronary arteries No coronary artery disease Mild aortic stenosis, mean PG 9 mmHg Likely etiology of dyspnea is COPD and borderline PH (mPAP 23 mmHg), likely WHO Grp III  Carotid artery duplex  03/19/2019: No hemodynamically significant stenosis noted in bilateral internal carotid arteries.  Minimal heterogeneous plaque noted in the left ICA.   The right CCA velocity is minimally elevated but no significant plaque noted. Antegrade right vertebral artery flow. Antegrade left vertebral artery flow.  14 day extended Holter monitor: Dominant rhythm normal sinus rhythm.  Heart rate 58-187 bpm.  Avg HR 83 bpm. No atrial fibrillation, ventricular tachycardia, high grade AV block, pauses (3 seconds or longer). Total ventricular ectopic burden <1%. Total supraventricular ectopic burden <1%. Patient triggered events: 4.  Predominately normal sinus rhythm with one episode of SVT and occasional ventricular ectopy.   LABORATORY DATA: CBC Latest Ref Rng & Units 12/18/2020 12/18/2020 12/09/2020  WBC 3.4 - 10.8 x10E3/uL - - 5.7  Hemoglobin 12.0 - 15.0 g/dL 11.6(L) 11.9(L) 14.6  Hematocrit 36.0 - 46.0 % 34.0(L) 35.0(L) 42.1  Platelets 150 - 450 x10E3/uL - - 127(L)     CMP Latest Ref Rng & Units 04/27/2021 12/18/2020 12/18/2020  Glucose 65 - 99 mg/dL 90 - -  BUN 8 - 27 mg/dL 18 - -  Creatinine 0.57 - 1.00 mg/dL 0.94 - -  Sodium 134 - 144 mmol/L 139 142 141  Potassium 3.5 - 5.2 mmol/L 4.2 2.7(LL) 2.7(LL)  Chloride 96 - 106 mmol/L 103 - -  CO2 20 - 29 mmol/L 21 - -  Calcium 8.7 - 10.3 mg/dL 10.0 - -  Total Protein 6.5 - 8.1 g/dL - - -  Total Bilirubin 0.3 - 1.2 mg/dL - - -  Alkaline Phos 38 - 126 U/L - - -  AST 15 - 41 U/L - - -  ALT 0 - 44 U/L - - -    Lipid Panel     Component Value Date/Time   CHOL 166 12/09/2020 1035   TRIG 125 12/09/2020 1035   HDL 47 12/09/2020 1035   LDLCALC 97 12/09/2020 1035   LDLDIRECT 104 (H) 12/09/2020 1035   LABVLDL 22 12/09/2020 1035    No  results found for: HGBA1C No components found for: NTPROBNP No results found for: TSH  Cardiac Panel (last 3 results) No results for input(s): CKTOTAL, CKMB, TROPONINIHS, RELINDX in the last 72 hours.   External Labs: Collected: 04/09/2021 provided by PCP Creatinine 1.13 mg/dL. Sodium 132, potassium 3.2, chloride 94, bicarb 25 AST 32, ALT 27, alkaline phosphatase 54 Hemoglobin A1c 9.5 Total cholesterol 121, triglycerides 150, HDL 53, 8 LDL 43, non-HDL 68  IMPRESSION:    ICD-10-CM   1. Chronic heart failure with preserved ejection fraction (HFpEF) (HCC)  I50.32 spironolactone (ALDACTONE) 25 MG tablet    2. Dyspnea on exertion  R06.00 spironolactone (ALDACTONE) 25 MG tablet    3. Non-insulin dependent type 2 diabetes mellitus ([place])  E11.9     4. Chronic obstructive pulmonary disease, unspecified COPD type ([place])  J44.9     5. Non-alcoholic cirrhosis ([place])  K74.60     6. Hx of breast cancer  Z85.3     7. Former smoker  Z87.891     54. Mild aortic stenosis  I35.0     9. PVC's (premature ventricular contractions)  I49.3     10. Palpitations  R00.2     11. PSVT (paroxysmal supraventricular tachycardia) (HCC)  I47.1        RECOMMENDATIONS: [name] [name]  is a 80 y.o. female whose past medical history and cardiovascular risk factors include: hypertension, hyperlipidemia, non-insulin-dependent diabetes mellitus type 2, HFpEF/stage B/ NYHA II, COPD, OA, depression, history of breast cancer s/p lumpectomy in 2005 and radiation and chemotherapy in 123456, non-alcoholic cirrhosis of liver in 2015, and former tobacco use.  Chronic heart failure with preserved EF: Diagnosed prior to establishing care with myself in August 2021. Currently euvolemic NT proBNP within normal limits. Has tolerated initiation of Jardiance well without any side effects or intolerances. Another prescription for spironolactone has been sent with labs in 1 week to evaluate kidney function and electrolytes No recent hospitalizations for congestive heart failure. Continue uptitrating of GDMT in a stepwise fashion  Dyspnea on exertion:  Improved compared to last office visit. Multifactorial: COPD, chronic HFpEF, former tobacco use, WHO class III PH.  Appears euvolemic. Recommend up titration of medical therapy to maximally tolerated GDMT Based on pulmonary/sleep medicine she does not have sleep apnea but hypoxemia at night.  She is in the process of getting nocturnal oxygen set up. Echocardiogram with bubble study has been ordered by her other providers to evaluate for extracardiac shunt.  Mixed hyperlipidemia: Tolerating statin therapy without any side effects or intolerances. Outside labs independently reviewed and LDL is less than 70 mg/dL as of August 2022. Continue to monitor.  Palpitations: Continue Cardizem to 40 mg p.o. daily.  Aortic stenosis: Mild, based on invasive hemodynamics Denies any symptoms of angina pectoris, heart failure, or syncope. We will continue to monitor  Benign essential hypertension: Blood pressure currently at goal.  Medications reconciled.  Currently managed by primary care provider.  Former smoker: 23 year pack history of smoking.   Educated on the importance of continued smoking cessation.  FINAL MEDICATION LIST END OF ENCOUNTER: Meds ordered this encounter  Medications   spironolactone (ALDACTONE) 25 MG tablet    Sig: Take 1 tablet (25 mg total) by mouth daily.    Dispense:  90 tablet    Refill:  1    Current Outpatient Medications:    albuterol (VENTOLIN HFA) 108 (90 Base) MCG/ACT inhaler, Inhale 2 puffs into the lungs every 4 (four) hours as needed for wheezing  or shortness of breath., Disp: 6.7 g, Rfl: 5   amitriptyline (ELAVIL) 25 MG tablet, Take 2 tablets (50 mg total) by mouth at bedtime. (Patient taking differently: Take 25 mg by mouth at bedtime.), Disp: 180 tablet, Rfl: 1   Cholecalciferol (D-3-5) 125 MCG (5000 UT) capsule, Take 10,000 Units by mouth daily., Disp: , Rfl:    diltiazem (CARDIZEM CD) 240 MG 24 hr capsule, TAKE 1 CAPSULE EVERY DAY, Disp: 90 capsule, Rfl: 0   dimenhyDRINATE (DRAMAMINE) 50 MG tablet, Take 50 mg by mouth every 8 (eight) hours as needed for nausea., Disp: , Rfl:    empagliflozin (JARDIANCE) 10 MG TABS tablet, Take 1 tablet (10 mg total) by mouth daily before breakfast., Disp: 90 tablet, Rfl: 0   glipiZIDE (GLUCOTROL XL) 10 MG 24 hr tablet, Take 10 mg by mouth daily with breakfast., Disp: , Rfl:    magnesium oxide (MAG-OX) 400 MG tablet, Take 400 mg by mouth daily., Disp: , Rfl:    melatonin 3 MG TABS tablet, Take 6 mg by mouth at bedtime., Disp: , Rfl:    metFORMIN (GLUCOPHAGE) 500 MG tablet, Take 1 tablet (500 mg total) by mouth 2 (two) times daily with a meal. (Patient taking differently: Take 2 tablets by mouth 2 (two) times daily with a meal.), Disp: 180 tablet, Rfl: 1   Misc Natural Products (APPLE CIDER VINEGAR DIET PO), Take by mouth., Disp: , Rfl:    Multiple Vitamins-Minerals (ONE-A-DAY WOMENS 50+ ADVANTAGE PO), Take 1 tablet by mouth daily., Disp: , Rfl:    rosuvastatin (CRESTOR) 10 MG tablet, TAKE 1 TABLET BY MOUTH EVERYDAY AT BEDTIME, Disp: 90 tablet, Rfl: 1    spironolactone (ALDACTONE) 25 MG tablet, Take 1 tablet (25 mg total) by mouth daily., Disp: 90 tablet, Rfl: 1  No orders of the defined types were placed in this encounter.  --Continue cardiac medications as reconciled in final medication list. --Return in about 7 weeks (around 07/07/2021) for Follow up, heart failure management.. Or sooner if needed. --Continue follow-up with your primary care physician regarding the management of your other chronic comorbid conditions.  Patient's questions and concerns were addressed to her satisfaction. She voices understanding of the instructions provided during this encounter.   This note was created using a voice recognition software as a result there may be grammatical errors inadvertently enclosed that do not reflect the nature of this encounter. Every attempt is made to correct such errors.  Rex Kras, [place], [facility]  Pager: [phone] Office: [phone]

## 2021-05-29 ENCOUNTER — Other Ambulatory Visit: Payer: Self-pay

## 2021-05-29 DIAGNOSIS — I5032 Chronic diastolic (congestive) heart failure: Secondary | ICD-10-CM

## 2021-05-29 DIAGNOSIS — R0600 Dyspnea, unspecified: Secondary | ICD-10-CM

## 2021-05-29 DIAGNOSIS — R0609 Other forms of dyspnea: Secondary | ICD-10-CM

## 2021-05-29 MED ORDER — SPIRONOLACTONE 25 MG PO TABS: 25.0000 mg | ORAL_TABLET | Freq: Every day | ORAL | 1 refills | Status: DC

## 2021-06-02 DIAGNOSIS — I10 Essential (primary) hypertension: Secondary | ICD-10-CM | POA: Diagnosis not present

## 2021-06-02 DIAGNOSIS — E7800 Pure hypercholesterolemia, unspecified: Secondary | ICD-10-CM | POA: Diagnosis not present

## 2021-06-02 DIAGNOSIS — D61818 Other pancytopenia: Secondary | ICD-10-CM | POA: Diagnosis not present

## 2021-06-02 DIAGNOSIS — J449 Chronic obstructive pulmonary disease, unspecified: Secondary | ICD-10-CM | POA: Diagnosis not present

## 2021-06-02 DIAGNOSIS — F329 Major depressive disorder, single episode, unspecified: Secondary | ICD-10-CM | POA: Diagnosis not present

## 2021-06-02 DIAGNOSIS — E785 Hyperlipidemia, unspecified: Secondary | ICD-10-CM | POA: Diagnosis not present

## 2021-06-02 DIAGNOSIS — K219 Gastro-esophageal reflux disease without esophagitis: Secondary | ICD-10-CM | POA: Diagnosis not present

## 2021-06-02 DIAGNOSIS — E1169 Type 2 diabetes mellitus with other specified complication: Secondary | ICD-10-CM | POA: Diagnosis not present

## 2021-06-05 DIAGNOSIS — I10 Essential (primary) hypertension: Secondary | ICD-10-CM | POA: Diagnosis not present

## 2021-06-08 ENCOUNTER — Ambulatory Visit (HOSPITAL_COMMUNITY)
Admission: RE | Admit: 2021-06-08 | Discharge: 2021-06-08 | Disposition: A | Discharge status: Home / Self Care | Payer: Medicare HMO | Source: Ambulatory Visit | Attending: Cardiology | Admitting: Cardiology

## 2021-06-08 ENCOUNTER — Other Ambulatory Visit: Payer: Self-pay

## 2021-06-08 DIAGNOSIS — I350 Nonrheumatic aortic (valve) stenosis: Secondary | ICD-10-CM | POA: Insufficient documentation

## 2021-06-08 NOTE — Progress Notes (Signed)
  Echocardiogram 2D Echocardiogram has been performed.  [name] [name] F 06/08/2021, 4:30 PM

## 2021-06-09 ENCOUNTER — Telehealth: Payer: Self-pay | Admitting: Cardiology

## 2021-06-09 ENCOUNTER — Other Ambulatory Visit: Payer: Self-pay

## 2021-06-09 DIAGNOSIS — R0609 Other forms of dyspnea: Secondary | ICD-10-CM

## 2021-06-09 MED ORDER — ROSUVASTATIN CALCIUM 10 MG PO TABS: ORAL_TABLET | ORAL | 1 refills | Status: AC

## 2021-06-09 NOTE — Telephone Encounter (Signed)
Patient requesting refill for rosuvastatin 10 mg

## 2021-06-09 NOTE — Telephone Encounter (Signed)
Done

## 2021-06-15 DIAGNOSIS — R350 Frequency of micturition: Secondary | ICD-10-CM | POA: Diagnosis not present

## 2021-06-15 DIAGNOSIS — B372 Candidiasis of skin and nail: Secondary | ICD-10-CM | POA: Diagnosis not present

## 2021-06-15 DIAGNOSIS — Z6836 Body mass index (BMI) 36.0-36.9, adult: Secondary | ICD-10-CM | POA: Diagnosis not present

## 2021-06-15 DIAGNOSIS — N898 Other specified noninflammatory disorders of vagina: Secondary | ICD-10-CM | POA: Diagnosis not present

## 2021-06-17 DIAGNOSIS — I350 Nonrheumatic aortic (valve) stenosis: Secondary | ICD-10-CM | POA: Diagnosis not present

## 2021-06-17 LAB — ECHOCARDIOGRAM COMPLETE
AR max vel: 1.27 cm2
AV Area VTI: 1.36 cm2
AV Area mean vel: 1.22 cm2
AV Mean grad: 11.5 mmHg
AV Peak grad: 20.3 mmHg
Ao pk vel: 2.26 m/s
Area-P 1/2: 3.29 cm2
Calc EF: 58.2 %
S' Lateral: 3 cm
Single Plane A2C EF: 65.2 %
Single Plane A4C EF: 58.3 %

## 2021-06-23 ENCOUNTER — Telehealth: Payer: Self-pay

## 2021-06-23 NOTE — Telephone Encounter (Signed)
I will work on it

## 2021-06-23 NOTE — Telephone Encounter (Signed)
Please fill out as much as you can [place]. I can review it tomorrow if call is light by coming to the office. Otherwise, Thursday it is.

## 2021-06-26 NOTE — Progress Notes (Signed)
Called and spoke to pt, pt voiced understanding.

## 2021-06-29 DIAGNOSIS — Z23 Encounter for immunization: Secondary | ICD-10-CM | POA: Diagnosis not present

## 2021-06-29 DIAGNOSIS — Z1389 Encounter for screening for other disorder: Secondary | ICD-10-CM | POA: Diagnosis not present

## 2021-06-29 DIAGNOSIS — I5032 Chronic diastolic (congestive) heart failure: Secondary | ICD-10-CM | POA: Diagnosis not present

## 2021-06-29 DIAGNOSIS — E1169 Type 2 diabetes mellitus with other specified complication: Secondary | ICD-10-CM | POA: Diagnosis not present

## 2021-06-29 DIAGNOSIS — Z0000 Encounter for general adult medical examination without abnormal findings: Secondary | ICD-10-CM | POA: Diagnosis not present

## 2021-06-30 LAB — BASIC METABOLIC PANEL
BUN/Creatinine Ratio: 20 (ref 12–28)
BUN: 21 mg/dL (ref 8–27)
CO2: 22 mmol/L (ref 20–29)
Calcium: 10.3 mg/dL (ref 8.7–10.3)
Chloride: 103 mmol/L (ref 96–106)
Creatinine, Ser: 1.04 mg/dL — ABNORMAL HIGH (ref 0.57–1.00)
Glucose: 154 mg/dL — ABNORMAL HIGH (ref 70–99)
Potassium: 4.6 mmol/L (ref 3.5–5.2)
Sodium: 140 mmol/L (ref 134–144)
eGFR: 55 mL/min/{1.73_m2} — ABNORMAL LOW (ref >59)

## 2021-06-30 LAB — MAGNESIUM: Magnesium: 1.9 mg/dL (ref 1.6–2.3)

## 2021-06-30 LAB — PRO B NATRIURETIC PEPTIDE: NT-Pro BNP: 56 pg/mL (ref 0–738)

## 2021-07-01 DIAGNOSIS — R350 Frequency of micturition: Secondary | ICD-10-CM | POA: Diagnosis not present

## 2021-07-02 NOTE — Progress Notes (Signed)
Called and spoke with patient regarding her recent lab results.

## 2021-07-03 ENCOUNTER — Other Ambulatory Visit: Payer: Self-pay | Admitting: Family Medicine

## 2021-07-03 ENCOUNTER — Ambulatory Visit: Payer: Medicare PPO | Admitting: Cardiology

## 2021-07-03 DIAGNOSIS — Z8739 Personal history of other diseases of the musculoskeletal system and connective tissue: Secondary | ICD-10-CM

## 2021-07-03 DIAGNOSIS — Z1231 Encounter for screening mammogram for malignant neoplasm of breast: Secondary | ICD-10-CM

## 2021-07-06 DIAGNOSIS — F329 Major depressive disorder, single episode, unspecified: Secondary | ICD-10-CM | POA: Diagnosis not present

## 2021-07-06 DIAGNOSIS — E7800 Pure hypercholesterolemia, unspecified: Secondary | ICD-10-CM | POA: Diagnosis not present

## 2021-07-06 DIAGNOSIS — K219 Gastro-esophageal reflux disease without esophagitis: Secondary | ICD-10-CM | POA: Diagnosis not present

## 2021-07-06 DIAGNOSIS — J449 Chronic obstructive pulmonary disease, unspecified: Secondary | ICD-10-CM | POA: Diagnosis not present

## 2021-07-06 DIAGNOSIS — I10 Essential (primary) hypertension: Secondary | ICD-10-CM | POA: Diagnosis not present

## 2021-07-06 DIAGNOSIS — E1169 Type 2 diabetes mellitus with other specified complication: Secondary | ICD-10-CM | POA: Diagnosis not present

## 2021-07-06 DIAGNOSIS — N189 Chronic kidney disease, unspecified: Secondary | ICD-10-CM | POA: Diagnosis not present

## 2021-07-06 DIAGNOSIS — M810 Age-related osteoporosis without current pathological fracture: Secondary | ICD-10-CM | POA: Diagnosis not present

## 2021-07-09 ENCOUNTER — Other Ambulatory Visit: Payer: Self-pay

## 2021-07-09 ENCOUNTER — Encounter: Payer: Self-pay | Admitting: Cardiology

## 2021-07-09 ENCOUNTER — Ambulatory Visit: Payer: Medicare HMO | Admitting: Cardiology

## 2021-07-09 VITALS — BP 128/71 | HR 87 | Resp 16 | Ht 62.0 in | Wt 189.2 lb

## 2021-07-09 DIAGNOSIS — I10 Essential (primary) hypertension: Secondary | ICD-10-CM | POA: Diagnosis not present

## 2021-07-09 DIAGNOSIS — I5032 Chronic diastolic (congestive) heart failure: Secondary | ICD-10-CM

## 2021-07-09 DIAGNOSIS — Z87891 Personal history of nicotine dependence: Secondary | ICD-10-CM | POA: Diagnosis not present

## 2021-07-09 DIAGNOSIS — E119 Type 2 diabetes mellitus without complications: Secondary | ICD-10-CM | POA: Diagnosis not present

## 2021-07-09 DIAGNOSIS — J449 Chronic obstructive pulmonary disease, unspecified: Secondary | ICD-10-CM

## 2021-07-09 DIAGNOSIS — R0609 Other forms of dyspnea: Secondary | ICD-10-CM

## 2021-07-09 DIAGNOSIS — I350 Nonrheumatic aortic (valve) stenosis: Secondary | ICD-10-CM | POA: Diagnosis not present

## 2021-07-09 DIAGNOSIS — E1169 Type 2 diabetes mellitus with other specified complication: Secondary | ICD-10-CM | POA: Diagnosis not present

## 2021-07-09 NOTE — Progress Notes (Signed)
[name] Date of Birth: [birth year] MRN: [id number] Primary Care Provider:Miller, Lattie Haw, MD Former Cardiology Providers: Jeri Lager, APRN, FNP-C Primary Cardiologist: Rex Kras, DO, [facility] (established care 05/06/2020)  Date: 07/09/21 Last Office Visit: 05/21/2021  Chief Complaint  Patient presents with   Follow-up   Congestive Heart Failure   HPI  [name] [name] is a 80 y.o.  female who presents to the office with a chief complaint of " follow-up for congestive heart failure management" Patient's past medical history and cardiovascular risk factors include: hypertension, hyperlipidemia, HFpEF/stage B/ NYHA II, COPD, OA, depression, history of breast cancer s/p lumpectomy in 2005 and radiation and chemotherapy in 7124, non-alcoholic cirrhosis of liver in 2015, and former tobacco use.  Being followed for management of HFpEF.  Due to worsening shortness of breath in the recent past underwent left and right heart catheterization.  Angiographically noted to have normal epicardial coronary arteries, mild aortic stenosis, and borderline pulmonary hypertension with a mean PAP of 23 mmHg most likely secondary to WHO class III disease.  Patient has been followed periodically for management of congestive heart failure which was diagnosed prior to establishing care.  In the past discontinued chlorthalidone and potassium supplements and started her on spironolactone at the last office visit.  Recent labs from 06/25/2021 notes stable kidney function, electrolytes, and NT proBNP.  She was also on Jardiance given her HFpEF however since last office visit and the medication has been discontinued due to to yeast infections.  She is also had a flareup of gastroenteritis due to E. coli infection.  No hospitalizations or urgent care visits for cardiovascular symptoms.  She is currently working with pulmonary medicine for possible supplemental oxygen at night and she was found to have hypoxemia  nocturnally.  Patient has lost 5 pounds since last office visit.  Most recent echocardiogram results reviewed with her and noted below for further reference.  FUNCTIONAL STATUS: No structured exercise program or daily routine.   ALLERGIES: No Known Allergies   MEDICATION LIST PRIOR TO VISIT: Current Outpatient Medications on File Prior to Visit  Medication Sig Dispense Refill   acetaminophen (TYLENOL) 500 MG tablet Take 500 mg by mouth every 6 (six) hours as needed.     amitriptyline (ELAVIL) 25 MG tablet Take 2 tablets (50 mg total) by mouth at bedtime. (Patient taking differently: Take 25 mg by mouth at bedtime.) 180 tablet 1   Cholecalciferol (D-3-5) 125 MCG (5000 UT) capsule Take 10,000 Units by mouth daily.     diltiazem (CARDIZEM CD) 240 MG 24 hr capsule TAKE 1 CAPSULE EVERY DAY 90 capsule 0   dimenhyDRINATE (DRAMAMINE) 50 MG tablet Take 50 mg by mouth every 8 (eight) hours as needed for nausea.     glipiZIDE (GLUCOTROL XL) 10 MG 24 hr tablet Take 10 mg by mouth daily with breakfast.     magnesium oxide (MAG-OX) 400 MG tablet Take 400 mg by mouth daily.     melatonin 3 MG TABS tablet Take 6 mg by mouth at bedtime.     metFORMIN (GLUCOPHAGE) 500 MG tablet Take 1 tablet (500 mg total) by mouth 2 (two) times daily with a meal. (Patient taking differently: Take 2 tablets by mouth 2 (two) times daily with a meal.) 180 tablet 1   Misc Natural Products (APPLE CIDER VINEGAR DIET PO) Take by mouth.     Multiple Vitamins-Minerals (ONE-A-DAY WOMENS 50+ ADVANTAGE PO) Take 1 tablet by mouth daily.     rosuvastatin (CRESTOR) 10 MG  tablet TAKE 1 TABLET BY MOUTH EVERYDAY AT BEDTIME 90 tablet 1   spironolactone (ALDACTONE) 25 MG tablet Take 1 tablet (25 mg total) by mouth daily. 90 tablet 1   albuterol (VENTOLIN HFA) 108 (90 Base) MCG/ACT inhaler Inhale 2 puffs into the lungs every 4 (four) hours as needed for wheezing or shortness of breath. 6.7 g 5   No current facility-administered medications on  file prior to visit.    PAST MEDICAL HISTORY: Past Medical History:  Diagnosis Date   Aortic stenosis    Breast cancer (HCC)    Cancer (HCC)    Chest pain    Chronic kidney disease    Cirrhosis (HCC)    COPD (chronic obstructive pulmonary disease) (HCC)    DJD (degenerative joint disease)    GERD (gastroesophageal reflux disease)    Hyperlipidemia    Hypertension    Leg edema    Orthopnea    Palpitation    Personal history of radiation therapy    SOB (shortness of breath)    Vitamin D deficiency     PAST SURGICAL HISTORY: Past Surgical History:  Procedure Laterality Date   BREAST BIOPSY     BREAST LUMPECTOMY Left    CARDIAC CATHETERIZATION     CORONARY ANGIOPLASTY     LAPAROSCOPIC TOTAL HYSTERECTOMY     RIGHT/LEFT HEART CATH AND CORONARY ANGIOGRAPHY N/A 12/18/2020   Procedure: RIGHT/LEFT HEART CATH AND CORONARY ANGIOGRAPHY;  Surgeon: Nigel Mormon, MD;  Location: [facility];  Service: Cardiovascular;  Laterality: N/A;    FAMILY HISTORY: The patient's family history includes Cardiomyopathy in her father; Heart attack in her mother; Stroke in her mother.   SOCIAL HISTORY:  The patient  reports that she quit smoking about 17 years ago. Her smoking use included cigarettes. She started smoking about 60 years ago. She has a 15.00 pack-year smoking history. She has never used smokeless tobacco. She reports that she does not drink alcohol and does not use drugs.  Review of Systems  Constitutional: Negative for chills, fever and weight loss.  HENT:  Negative for hoarse voice and nosebleeds.   Eyes:  Negative for discharge, double vision and pain.  Cardiovascular:  Positive for dyspnea on exertion (chronic and stable). Negative for chest pain, claudication, leg swelling, near-syncope, orthopnea, palpitations, paroxysmal nocturnal dyspnea and syncope.  Respiratory:  Positive for snoring. Negative for hemoptysis and shortness of breath.   Musculoskeletal:  Positive  for back pain and joint pain. Negative for muscle cramps and myalgias.  Gastrointestinal:  Negative for abdominal pain, constipation, diarrhea, hematemesis, hematochezia, melena, nausea and vomiting.  Neurological:  Negative for dizziness and light-headedness.   PHYSICAL EXAM: Vitals with BMI 07/09/2021 05/21/2021 05/13/2021  Height 5\' 2"  5\' 2"  5\' 2"   Weight 189 lbs 3 oz 194 lbs 10 oz 197 lbs  BMI 34.6 35.46 56.81  Systolic 275 170 -  Diastolic 71 67 -  Pulse 87 78 -   CONSTITUTIONAL: Appears older than stated age, hemodynamically stable, conversational dyspnea, well-nourished.   SKIN: Skin is warm and dry. No rash noted. No cyanosis. No pallor. No jaundice HEAD: Normocephalic and atraumatic.  EYES: No scleral icterus MOUTH/THROAT: Moist oral membranes.  NECK: No JVD present. No thyromegaly noted. No carotid bruits  LYMPHATIC: No visible cervical adenopathy.  CHEST Normal respiratory effort. No intercostal retractions  LUNGS: Clear to auscultation bilaterally.  No stridor. No wheezes. No rales.  CARDIOVASCULAR: Regular rate and rhythm, positive Y1-V4, soft holosystolic murmur at the apex,  no gallops or rubs. ABDOMINAL: Obese, soft, nontender, nondistended, positive bowel sounds all 4 quadrants, no apparent ascites.  EXTREMITIES: No peripheral edema  HEMATOLOGIC: No significant bruising NEUROLOGIC: Oriented to person, place, and time. Nonfocal. Normal muscle tone.  PSYCHIATRIC: Normal mood and affect. Normal behavior. Cooperative  CARDIAC DATABASE: EKG: 01/01/2021: NSR, 96bpm, LAD, LAFB, old anteroseptal infarct, rare PVCs.  Echocardiogram: 06/08/2021 1. Left ventricular ejection fraction, by estimation, is 50 to 55%. The left ventricle has low normal function. The left ventricle has no regional wall motion abnormalities. There is mild left ventricular hypertrophy. Left ventricular diastolic  parameters were normal. 2. Right ventricular systolic function is normal. The right  ventricular size is normal. 3. The mitral valve is normal in structure. Trivial mitral valve regurgitation. No evidence of mitral stenosis. 4. Native Aortic valve, not well visualized, no regurgitation, mild to moderate AS (peak velocity 2.71m/s, PG 42mmHG, MG11.18mmHg, AVA VTI 1.36cm2, DI 0.43).  Stress Testing:  Lexiscan Myoview stress test 03/12/2019: Lexiscan stress test was performed. Stress EKG is non-diagnostic, as this is pharmacological stress test. Normal myocardial perfusion, with mild uniform breast tissue attenuation in inferior myocardium. LVEF 53%. Low risk study.   Heart Catheterization: 12/18/2020: Angiographically normal coronary arteries No coronary artery disease Mild aortic stenosis, mean PG 9 mmHg Likely etiology of dyspnea is COPD and borderline PH (mPAP 23 mmHg), likely WHO Grp III  Carotid artery duplex  03/19/2019: No hemodynamically significant stenosis noted in bilateral internal carotid arteries.  Minimal heterogeneous plaque noted in the left ICA.   The right CCA velocity is minimally elevated but no significant plaque noted. Antegrade right vertebral artery flow. Antegrade left vertebral artery flow.  14 day extended Holter monitor: Dominant rhythm normal sinus rhythm.  Heart rate 58-187 bpm.  Avg HR 83 bpm. No atrial fibrillation, ventricular tachycardia, high grade AV block, pauses (3 seconds or longer). Total ventricular ectopic burden <1%. Total supraventricular ectopic burden <1%. Patient triggered events: 4.  Predominately normal sinus rhythm with one episode of SVT and occasional ventricular ectopy.   LABORATORY DATA: CBC Latest Ref Rng & Units 12/18/2020 12/18/2020 12/09/2020  WBC 3.4 - 10.8 x10E3/uL - - 5.7  Hemoglobin 12.0 - 15.0 g/dL 11.6(L) 11.9(L) 14.6  Hematocrit 36.0 - 46.0 % 34.0(L) 35.0(L) 42.1  Platelets 150 - 450 x10E3/uL - - 127(L)    CMP Latest Ref Rng & Units 06/29/2021 04/27/2021 12/18/2020  Glucose 70 - 99 mg/dL 154(H) 90 -  BUN 8  - 27 mg/dL 21 18 -  Creatinine 0.57 - 1.00 mg/dL 1.04(H) 0.94 -  Sodium 134 - 144 mmol/L 140 139 142  Potassium 3.5 - 5.2 mmol/L 4.6 4.2 2.7(LL)  Chloride 96 - 106 mmol/L 103 103 -  CO2 20 - 29 mmol/L 22 21 -  Calcium 8.7 - 10.3 mg/dL 10.3 10.0 -  Total Protein 6.5 - 8.1 g/dL - - -  Total Bilirubin 0.3 - 1.2 mg/dL - - -  Alkaline Phos 38 - 126 U/L - - -  AST 15 - 41 U/L - - -  ALT 0 - 44 U/L - - -    Lipid Panel     Component Value Date/Time   CHOL 166 12/09/2020 1035   TRIG 125 12/09/2020 1035   HDL 47 12/09/2020 1035   LDLCALC 97 12/09/2020 1035   LDLDIRECT 104 (H) 12/09/2020 1035   LABVLDL 22 12/09/2020 1035    No results found for: HGBA1C No components found for: NTPROBNP No results found for: TSH  Cardiac  Panel (last 3 results) No results for input(s): CKTOTAL, CKMB, TROPONINIHS, RELINDX in the last 72 hours.   External Labs: Collected: 04/09/2021 provided by PCP Creatinine 1.13 mg/dL. Sodium 132, potassium 3.2, chloride 94, bicarb 25 AST 32, ALT 27, alkaline phosphatase 54 Hemoglobin A1c 9.5 Total cholesterol 121, triglycerides 150, HDL 53, 8 LDL 43, non-HDL 68  IMPRESSION:    ICD-10-CM   1. Chronic heart failure with preserved ejection fraction (HFpEF) (HCC)  I50.32 CANCELED: EKG 12-Lead    2. Dyspnea on exertion  R06.09     3. Mild aortic stenosis  I35.0     4. Non-insulin dependent type 2 diabetes mellitus ([place])  E11.9     5. Chronic obstructive pulmonary disease, unspecified COPD type ([place])  J44.9     6. Former smoker  Z87.891     28. Benign hypertension  I10        RECOMMENDATIONS: MARTINA BRODBECK is a 80 y.o. female whose past medical history and cardiovascular risk factors include: hypertension, hyperlipidemia, non-insulin-dependent diabetes mellitus type 2, HFpEF/stage B/ NYHA II, COPD, OA, depression, history of breast cancer s/p lumpectomy in 2005 and radiation and chemotherapy in 0093, non-alcoholic cirrhosis of liver in 2015, and former  tobacco use.  Chronic heart failure with preserved EF: Diagnosed prior to establishing care with myself in August 2021. Currently euvolemic NT proBNP within normal limits based on recent labs from 06/29/2021 Has tolerated the initiation of spironolactone well.  Renal function and electrolytes remain stable. Was on Jardiance with the medication has been discontinued due to yeast infection x2. Since the patient is recovering from yeast infection x2 and a E. coli infection and overall is euvolemic we will hold off on further up titration of GDMT at this time until she recovers and is more back to baseline. She has lost 5 pounds since last office visit -most likely secondary to intravascular depletion.  Dyspnea on exertion:  Improved compared to last office visit. Multifactorial: COPD, chronic HFpEF, former tobacco use, WHO class III PH.  Euvolemic on physical examination Will hold off up titration of guideline directed medical therapy for reasons mentioned above. Based on pulmonary/sleep medicine she does not have sleep apnea but hypoxemia at night.  She is in the process of getting nocturnal oxygen set up.  Mixed hyperlipidemia: Tolerating statin therapy without any side effects or intolerances. Outside labs independently reviewed and LDL is less than 70 mg/dL as of August 2022. Continue to monitor.  Palpitations: Continue Cardizem 240 mg p.o. daily.  Aortic stenosis: Mild/moderate based on the recent echocardiogram. Based on her last heart catheterization the severity of aortic stenosis was noted to be mild. Denies any symptoms of angina pectoris, heart failure, or syncope. We will continue to monitor  Benign essential hypertension: Blood pressure currently at goal.  Medications reconciled.  Currently managed by primary care provider.  Former smoker: 23 year pack history of smoking.  Educated on the importance of continued smoking cessation.  FINAL MEDICATION LIST END OF  ENCOUNTER: No orders of the defined types were placed in this encounter.   Current Outpatient Medications:    acetaminophen (TYLENOL) 500 MG tablet, Take 500 mg by mouth every 6 (six) hours as needed., Disp: , Rfl:    amitriptyline (ELAVIL) 25 MG tablet, Take 2 tablets (50 mg total) by mouth at bedtime. (Patient taking differently: Take 25 mg by mouth at bedtime.), Disp: 180 tablet, Rfl: 1   Cholecalciferol (D-3-5) 125 MCG (5000 UT) capsule, Take 10,000 Units by mouth  daily., Disp: , Rfl:    diltiazem (CARDIZEM CD) 240 MG 24 hr capsule, TAKE 1 CAPSULE EVERY DAY, Disp: 90 capsule, Rfl: 0   dimenhyDRINATE (DRAMAMINE) 50 MG tablet, Take 50 mg by mouth every 8 (eight) hours as needed for nausea., Disp: , Rfl:    glipiZIDE (GLUCOTROL XL) 10 MG 24 hr tablet, Take 10 mg by mouth daily with breakfast., Disp: , Rfl:    magnesium oxide (MAG-OX) 400 MG tablet, Take 400 mg by mouth daily., Disp: , Rfl:    melatonin 3 MG TABS tablet, Take 6 mg by mouth at bedtime., Disp: , Rfl:    metFORMIN (GLUCOPHAGE) 500 MG tablet, Take 1 tablet (500 mg total) by mouth 2 (two) times daily with a meal. (Patient taking differently: Take 2 tablets by mouth 2 (two) times daily with a meal.), Disp: 180 tablet, Rfl: 1   Misc Natural Products (APPLE CIDER VINEGAR DIET PO), Take by mouth., Disp: , Rfl:    Multiple Vitamins-Minerals (ONE-A-DAY WOMENS 50+ ADVANTAGE PO), Take 1 tablet by mouth daily., Disp: , Rfl:    rosuvastatin (CRESTOR) 10 MG tablet, TAKE 1 TABLET BY MOUTH EVERYDAY AT BEDTIME, Disp: 90 tablet, Rfl: 1   spironolactone (ALDACTONE) 25 MG tablet, Take 1 tablet (25 mg total) by mouth daily., Disp: 90 tablet, Rfl: 1   albuterol (VENTOLIN HFA) 108 (90 Base) MCG/ACT inhaler, Inhale 2 puffs into the lungs every 4 (four) hours as needed for wheezing or shortness of breath., Disp: 6.7 g, Rfl: 5  No orders of the defined types were placed in this encounter.   --Continue cardiac medications as reconciled in final medication  list. --Return in about 3 months (around 10/09/2021) for Follow up, heart failure management.. Or sooner if needed. --Continue follow-up with your primary care physician regarding the management of your other chronic comorbid conditions.  Patient's questions and concerns were addressed to her satisfaction. She voices understanding of the instructions provided during this encounter.   This note was created using a voice recognition software as a result there may be grammatical errors inadvertently enclosed that do not reflect the nature of this encounter. Every attempt is made to correct such errors.  Rex Kras, [place], [facility]  Pager: [phone] Office: [phone]

## 2021-07-29 DIAGNOSIS — K219 Gastro-esophageal reflux disease without esophagitis: Secondary | ICD-10-CM | POA: Diagnosis not present

## 2021-07-29 DIAGNOSIS — I10 Essential (primary) hypertension: Secondary | ICD-10-CM | POA: Diagnosis not present

## 2021-07-29 DIAGNOSIS — E1169 Type 2 diabetes mellitus with other specified complication: Secondary | ICD-10-CM | POA: Diagnosis not present

## 2021-07-29 DIAGNOSIS — E7800 Pure hypercholesterolemia, unspecified: Secondary | ICD-10-CM | POA: Diagnosis not present

## 2021-07-29 DIAGNOSIS — J449 Chronic obstructive pulmonary disease, unspecified: Secondary | ICD-10-CM | POA: Diagnosis not present

## 2021-07-29 DIAGNOSIS — M810 Age-related osteoporosis without current pathological fracture: Secondary | ICD-10-CM | POA: Diagnosis not present

## 2021-07-29 DIAGNOSIS — N189 Chronic kidney disease, unspecified: Secondary | ICD-10-CM | POA: Diagnosis not present

## 2021-07-29 DIAGNOSIS — F329 Major depressive disorder, single episode, unspecified: Secondary | ICD-10-CM | POA: Diagnosis not present

## 2021-08-18 ENCOUNTER — Ambulatory Visit: Payer: Medicare HMO

## 2021-08-20 ENCOUNTER — Telehealth: Payer: Self-pay

## 2021-08-21 NOTE — Telephone Encounter (Signed)
Spoke to patient

## 2021-08-21 NOTE — Telephone Encounter (Signed)
Please refer to back to her PCP

## 2021-09-03 DIAGNOSIS — E1169 Type 2 diabetes mellitus with other specified complication: Secondary | ICD-10-CM | POA: Diagnosis not present

## 2021-09-05 DIAGNOSIS — K219 Gastro-esophageal reflux disease without esophagitis: Secondary | ICD-10-CM | POA: Diagnosis not present

## 2021-09-05 DIAGNOSIS — F329 Major depressive disorder, single episode, unspecified: Secondary | ICD-10-CM | POA: Diagnosis not present

## 2021-09-05 DIAGNOSIS — J449 Chronic obstructive pulmonary disease, unspecified: Secondary | ICD-10-CM | POA: Diagnosis not present

## 2021-09-05 DIAGNOSIS — M810 Age-related osteoporosis without current pathological fracture: Secondary | ICD-10-CM | POA: Diagnosis not present

## 2021-09-05 DIAGNOSIS — I10 Essential (primary) hypertension: Secondary | ICD-10-CM | POA: Diagnosis not present

## 2021-09-05 DIAGNOSIS — E7800 Pure hypercholesterolemia, unspecified: Secondary | ICD-10-CM | POA: Diagnosis not present

## 2021-09-05 DIAGNOSIS — E1169 Type 2 diabetes mellitus with other specified complication: Secondary | ICD-10-CM | POA: Diagnosis not present

## 2021-09-05 DIAGNOSIS — N189 Chronic kidney disease, unspecified: Secondary | ICD-10-CM | POA: Diagnosis not present

## 2021-09-09 DIAGNOSIS — R339 Retention of urine, unspecified: Secondary | ICD-10-CM | POA: Diagnosis not present

## 2021-09-10 DIAGNOSIS — N189 Chronic kidney disease, unspecified: Secondary | ICD-10-CM | POA: Diagnosis not present

## 2021-09-10 DIAGNOSIS — K219 Gastro-esophageal reflux disease without esophagitis: Secondary | ICD-10-CM | POA: Diagnosis not present

## 2021-09-10 DIAGNOSIS — M810 Age-related osteoporosis without current pathological fracture: Secondary | ICD-10-CM | POA: Diagnosis not present

## 2021-09-10 DIAGNOSIS — J449 Chronic obstructive pulmonary disease, unspecified: Secondary | ICD-10-CM | POA: Diagnosis not present

## 2021-09-10 DIAGNOSIS — E1169 Type 2 diabetes mellitus with other specified complication: Secondary | ICD-10-CM | POA: Diagnosis not present

## 2021-09-10 DIAGNOSIS — I10 Essential (primary) hypertension: Secondary | ICD-10-CM | POA: Diagnosis not present

## 2021-09-10 DIAGNOSIS — F329 Major depressive disorder, single episode, unspecified: Secondary | ICD-10-CM | POA: Diagnosis not present

## 2021-09-10 DIAGNOSIS — E7800 Pure hypercholesterolemia, unspecified: Secondary | ICD-10-CM | POA: Diagnosis not present

## 2021-09-10 DIAGNOSIS — D61818 Other pancytopenia: Secondary | ICD-10-CM | POA: Diagnosis not present

## 2021-09-24 ENCOUNTER — Other Ambulatory Visit: Payer: Self-pay | Admitting: *Deleted

## 2021-09-24 DIAGNOSIS — E1169 Type 2 diabetes mellitus with other specified complication: Secondary | ICD-10-CM | POA: Diagnosis not present

## 2021-09-24 DIAGNOSIS — J449 Chronic obstructive pulmonary disease, unspecified: Secondary | ICD-10-CM | POA: Diagnosis not present

## 2021-09-24 DIAGNOSIS — E785 Hyperlipidemia, unspecified: Secondary | ICD-10-CM | POA: Diagnosis not present

## 2021-09-24 DIAGNOSIS — D696 Thrombocytopenia, unspecified: Secondary | ICD-10-CM

## 2021-09-24 DIAGNOSIS — Z6834 Body mass index (BMI) 34.0-34.9, adult: Secondary | ICD-10-CM | POA: Diagnosis not present

## 2021-09-24 DIAGNOSIS — M810 Age-related osteoporosis without current pathological fracture: Secondary | ICD-10-CM | POA: Diagnosis not present

## 2021-09-25 ENCOUNTER — Inpatient Hospital Stay: Payer: Medicare PPO | Admitting: Hematology and Oncology

## 2021-09-25 ENCOUNTER — Other Ambulatory Visit: Payer: Self-pay

## 2021-09-25 ENCOUNTER — Inpatient Hospital Stay: Payer: Medicare PPO | Attending: Hematology and Oncology

## 2021-09-25 VITALS — BP 135/68 | HR 79 | Temp 97.6°F | Resp 17 | Wt 190.0 lb

## 2021-09-25 DIAGNOSIS — Z853 Personal history of malignant neoplasm of breast: Secondary | ICD-10-CM | POA: Insufficient documentation

## 2021-09-25 DIAGNOSIS — D696 Thrombocytopenia, unspecified: Secondary | ICD-10-CM

## 2021-09-25 LAB — CBC WITH DIFFERENTIAL (CANCER CENTER ONLY)
Abs Immature Granulocytes: 0.02 10*3/uL (ref 0.00–0.07)
Basophils Absolute: 0.1 10*3/uL (ref 0.0–0.1)
Basophils Relative: 1 %
Eosinophils Absolute: 0.3 10*3/uL (ref 0.0–0.5)
Eosinophils Relative: 4 %
HCT: 43.1 % (ref 36.0–46.0)
Hemoglobin: 15 g/dL (ref 12.0–15.0)
Immature Granulocytes: 0 %
Lymphocytes Relative: 27 %
Lymphs Abs: 2 10*3/uL (ref 0.7–4.0)
MCH: 31.9 pg (ref 26.0–34.0)
MCHC: 34.8 g/dL (ref 30.0–36.0)
MCV: 91.7 fL (ref 80.0–100.0)
Monocytes Absolute: 0.8 10*3/uL (ref 0.1–1.0)
Monocytes Relative: 10 %
Neutro Abs: 4.4 10*3/uL (ref 1.7–7.7)
Neutrophils Relative %: 58 %
Platelet Count: 130 10*3/uL — ABNORMAL LOW (ref 150–400)
RBC: 4.7 MIL/uL (ref 3.87–5.11)
RDW: 13.6 % (ref 11.5–15.5)
WBC Count: 7.5 10*3/uL (ref 4.0–10.5)
nRBC: 0 % (ref 0.0–0.2)

## 2021-09-25 LAB — CMP (CANCER CENTER ONLY)
ALT: 21 U/L (ref 0–44)
AST: 44 U/L — ABNORMAL HIGH (ref 15–41)
Albumin: 4.6 g/dL (ref 3.5–5.0)
Alkaline Phosphatase: 44 U/L (ref 38–126)
Anion gap: 10 (ref 5–15)
BUN: 26 mg/dL — ABNORMAL HIGH (ref 8–23)
CO2: 22 mmol/L (ref 22–32)
Calcium: 10.8 mg/dL — ABNORMAL HIGH (ref 8.9–10.3)
Chloride: 105 mmol/L (ref 98–111)
Creatinine: 0.92 mg/dL (ref 0.44–1.00)
GFR, Estimated: >60 mL/min (ref >60)
Glucose, Bld: 108 mg/dL — ABNORMAL HIGH (ref 70–99)
Potassium: 4.3 mmol/L (ref 3.5–5.1)
Sodium: 137 mmol/L (ref 135–145)
Total Bilirubin: 1.5 mg/dL — ABNORMAL HIGH (ref 0.3–1.2)
Total Protein: 7.7 g/dL (ref 6.5–8.1)

## 2021-09-25 LAB — VITAMIN B12: Vitamin B-12: 543 pg/mL (ref 180–914)

## 2021-09-25 MED ORDER — CYCLOBENZAPRINE HCL 5 MG PO TABS: 5.0000 mg | ORAL_TABLET | Freq: Three times a day (TID) | ORAL | 0 refills | Status: DC | PRN

## 2021-09-25 NOTE — Progress Notes (Signed)
[place] [phone]   [phone]  PROGRESS NOTE  Patient Care Team: Kathyrn Lass, MD as PCP - General (Family Medicine) Rex Kras, DO as Consulting Physician (Cardiology)  Hematological/Oncological History #Thrombocyopenia 1) 05/15/2007: WBC 7.5, Hgb 13.4, Plt 211. MCV 95.1.  2) 09/05/2019: WBC 7.2, Hgb 15.5, Plt 141 (nml 150-400). MCV 93.7.  3) 09/26/19: Establish care with Dr. Lorenso Courier    #Breast Cancer 1)  Left partial mastectomy with needle localization and specimen mammogram, left axillary lymph node dissection/radiation 2005 2) chemotherapy administered 2006 (regimen unclear)  Interval History:  [name] [name] 81 y.o. female with medical history significant for thrombocytopenia and remote breast cancer who presents for a follow up visit. The patient's last visit was on 09/26/2019. In the interim since the last visit she has had no major changes in her health.  The patient requested today's visit because she has been having difficulty with pain in her shoulder that stretches to her chest.  She does not believe this is related to her breast cancer but would like to have it evaluated.  She reports that she has felt sore and it feels like a muscular pain.  She notes it is not in the joint and does not feel like arthritis discomfort.  She notes that she does not feel a knot and has been using heat patches and Tylenol without much relief in this muscle pain.  She reports this started about 2 months ago and continues to get progressively worse.  She notes that she desires to lose weight but that this shoulder pain has made it more difficult.  On further discussion she notes that she has not had any issues with bleeding, bruising, or dark stools.  She notes no fatigue or shortness of breath.  Overall she is at her baseline level of health other than the shoulder discomfort.  She has a mammogram scheduled next month as well as a bone density test at the  same time.  She denies any fevers, chills, sweats, nausea, ming or diarrhea.  A full 10 point ROS is listed below.  MEDICAL HISTORY:  Past Medical History:  Diagnosis Date   Aortic stenosis    Breast cancer (HCC)    Cancer (HCC)    Chest pain    Chronic kidney disease    Cirrhosis (HCC)    COPD (chronic obstructive pulmonary disease) (HCC)    DJD (degenerative joint disease)    GERD (gastroesophageal reflux disease)    Hyperlipidemia    Hypertension    Leg edema    Orthopnea    Palpitation    Personal history of radiation therapy    SOB (shortness of breath)    Vitamin D deficiency     SURGICAL HISTORY: Past Surgical History:  Procedure Laterality Date   BREAST BIOPSY     BREAST LUMPECTOMY Left    CARDIAC CATHETERIZATION     CORONARY ANGIOPLASTY     LAPAROSCOPIC TOTAL HYSTERECTOMY     RIGHT/LEFT HEART CATH AND CORONARY ANGIOGRAPHY N/A 12/18/2020   Procedure: RIGHT/LEFT HEART CATH AND CORONARY ANGIOGRAPHY;  Surgeon: Nigel Mormon, MD;  Location: [facility];  Service: Cardiovascular;  Laterality: N/A;    SOCIAL HISTORY: Social History   Socioeconomic History   Marital status: Widowed    Spouse name: Not on file   Number of children: 4   Years of education: Not on file   Highest education level: Not on file  Occupational History   Not  on file  Tobacco Use   Smoking status: Former    Packs/day: 0.50    Years: 30.00    Pack years: 15.00    Types: Cigarettes    Start date: 02/12/1961    Quit date: 2005    Years since quitting: 18.0   Smokeless tobacco: Never  Vaping Use   Vaping Use: Never used  Substance and Sexual Activity   Alcohol use: Never   Drug use: Never   Sexual activity: Not on file  Other Topics Concern   Not on file  Social History Narrative   Not on file   Social Determinants of Health   Financial Resource Strain: Not on file  Food Insecurity: Not on file  Transportation Needs: Not on file  Physical Activity: Not on file   Stress: Not on file  Social Connections: Not on file  Intimate Partner Violence: Not on file    FAMILY HISTORY: Family History  Problem Relation Age of Onset   Heart attack Mother    Stroke Mother    Cardiomyopathy Father     ALLERGIES:  has No Known Allergies.  MEDICATIONS:  Current Outpatient Medications  Medication Sig Dispense Refill   cyclobenzaprine (FLEXERIL) 5 MG tablet Take 1 tablet (5 mg total) by mouth 3 (three) times daily as needed for muscle spasms. 30 tablet 0   acetaminophen (TYLENOL) 500 MG tablet Take 500 mg by mouth every 6 (six) hours as needed.     albuterol (VENTOLIN HFA) 108 (90 Base) MCG/ACT inhaler Inhale 2 puffs into the lungs every 4 (four) hours as needed for wheezing or shortness of breath. 6.7 g 5   amitriptyline (ELAVIL) 25 MG tablet Take 2 tablets (50 mg total) by mouth at bedtime. (Patient taking differently: Take 25 mg by mouth at bedtime.) 180 tablet 1   Cholecalciferol (D-3-5) 125 MCG (5000 UT) capsule Take 10,000 Units by mouth daily.     diltiazem (CARDIZEM CD) 240 MG 24 hr capsule TAKE 1 CAPSULE EVERY DAY 90 capsule 0   dimenhyDRINATE (DRAMAMINE) 50 MG tablet Take 50 mg by mouth every 8 (eight) hours as needed for nausea.     glipiZIDE (GLUCOTROL XL) 10 MG 24 hr tablet Take 10 mg by mouth daily with breakfast.     magnesium oxide (MAG-OX) 400 MG tablet Take 400 mg by mouth daily.     melatonin 3 MG TABS tablet Take 6 mg by mouth at bedtime.     metFORMIN (GLUCOPHAGE) 500 MG tablet Take 1 tablet (500 mg total) by mouth 2 (two) times daily with a meal. (Patient taking differently: Take 2 tablets by mouth 2 (two) times daily with a meal.) 180 tablet 1   Misc Natural Products (APPLE CIDER VINEGAR DIET PO) Take by mouth.     Multiple Vitamins-Minerals (ONE-A-DAY WOMENS 50+ ADVANTAGE PO) Take 1 tablet by mouth daily.     rosuvastatin (CRESTOR) 10 MG tablet TAKE 1 TABLET BY MOUTH EVERYDAY AT BEDTIME 90 tablet 1   spironolactone (ALDACTONE) 25 MG  tablet Take 1 tablet (25 mg total) by mouth daily. 90 tablet 1   No current facility-administered medications for this visit.    REVIEW OF SYSTEMS:   Constitutional: ( - ) fevers, ( - )  chills , ( - ) night sweats Eyes: ( - ) blurriness of vision, ( - ) double vision, ( - ) watery eyes Ears, nose, mouth, throat, and face: ( - ) mucositis, ( - ) sore throat Respiratory: ( - )  cough, ( - ) dyspnea, ( - ) wheezes Cardiovascular: ( - ) palpitation, ( - ) chest discomfort, ( - ) lower extremity swelling Gastrointestinal:  ( - ) nausea, ( - ) heartburn, ( - ) change in bowel habits Skin: ( - ) abnormal skin rashes Lymphatics: ( - ) new lymphadenopathy, ( - ) easy bruising Neurological: ( - ) numbness, ( - ) tingling, ( - ) new weaknesses Behavioral/Psych: ( - ) mood change, ( - ) new changes  All other systems were reviewed with the patient and are negative.  PHYSICAL EXAMINATION: Vitals:   09/25/21 1314  BP: 135/68  Pulse: 79  Resp: 17  Temp: 97.6 F (36.4 C)  SpO2: 97%   Filed Weights   09/25/21 1314  Weight: 190 lb (86.2 kg)    GENERAL: Well-appearing elderly Caucasian female, alert, no distress and comfortable SKIN: skin color, texture, turgor are normal, no rashes or significant lesions EYES: conjunctiva are pink and non-injected, sclera clear LUNGS: clear to auscultation and percussion with normal breathing effort HEART: regular rate & rhythm and no murmurs and no lower extremity edema Musculoskeletal: no cyanosis of digits and no clubbing  PSYCH: alert & oriented x 3, fluent speech NEURO: no focal motor/sensory deficits  LABORATORY DATA:  I have reviewed the data as listed CBC Latest Ref Rng & Units 09/25/2021 12/18/2020 12/18/2020  WBC 4.0 - 10.5 K/uL 7.5 - -  Hemoglobin 12.0 - 15.0 g/dL 15.0 11.6(L) 11.9(L)  Hematocrit 36.0 - 46.0 % 43.1 34.0(L) 35.0(L)  Platelets 150 - 400 K/uL 130(L) - -    CMP Latest Ref Rng & Units 09/25/2021 06/29/2021 04/27/2021  Glucose 70 -  99 mg/dL 108(H) 154(H) 90  BUN 8 - 23 mg/dL 26(H) 21 18  Creatinine 0.44 - 1.00 mg/dL 0.92 1.04(H) 0.94  Sodium 135 - 145 mmol/L 137 140 139  Potassium 3.5 - 5.1 mmol/L 4.3 4.6 4.2  Chloride 98 - 111 mmol/L 105 103 103  CO2 22 - 32 mmol/L _0 Calcium 8.9 - 10.3 mg/dL 10.8(H) 10.3 10.0  Total Protein 6.5 - 8.1 g/dL 7.7 - -  Total Bilirubin 0.3 - 1.2 mg/dL 1.5(H) - -  Alkaline Phos 38 - 126 U/L 44 - -  AST 15 - 41 U/L 44(H) - -  ALT 0 - 44 U/L 21 - -    No results found for: MPROTEIN Lab Results  Component Value Date   KPAFRELGTCHN 29.1 (H) 09/26/2019   LAMBDASER 15.3 09/26/2019   KAPLAMBRATIO 1.90 (H) 09/26/2019    RADIOGRAPHIC STUDIES: No results found.  ASSESSMENT & PLAN MEEYA GOLDIN 81 y.o. female with medical history significant for thrombocytopenia and remote breast cancer who presents for a follow up visit.  #Thrombocytopenia, mild --today will recheck baseline CMP,CBC, and  Vitamin B12 --no clear indication for splenic imaging or bone marrow biopsy as the thrombocytopenia is very mild at this time.  --Platelets today at 130, stable from April last year at 127.  Overall platelet count remains mildly depressed. --f/u in 12 months to repeat CBC and monitor her platelets    #ER/PR Positive HER2 negative Breast Cancer, in remission -- Mammogram scheduled next month per patient. --Bone density test scheduled next month as well. --Patient is currently in survivorship given the remote nature of her breast cancer.    No orders of the defined types were placed in this encounter.   All questions were answered. The patient knows to call the clinic with any problems,  questions or concerns.  A total of more than 30 minutes were spent on this encounter with face-to-face time and non-face-to-face time, including preparing to see the patient, ordering tests and/or medications, counseling the patient and coordination of care as outlined above.   Ledell Peoples,  MD Department of Hematology/Oncology [place] [facility] Phone: [phone] Pager: [phone] Email: [email or web address]  09/26/2021 3:41 PM

## 2021-09-28 ENCOUNTER — Telehealth: Payer: Self-pay | Admitting: Hematology and Oncology

## 2021-09-28 NOTE — Telephone Encounter (Signed)
Scheduled per 1/20 los, calender has been mailed to pt

## 2021-10-06 DIAGNOSIS — I10 Essential (primary) hypertension: Secondary | ICD-10-CM | POA: Diagnosis not present

## 2021-10-12 ENCOUNTER — Ambulatory Visit: Payer: Medicare HMO | Admitting: Cardiology

## 2021-10-19 DIAGNOSIS — N3281 Overactive bladder: Secondary | ICD-10-CM | POA: Diagnosis not present

## 2021-10-19 DIAGNOSIS — R8279 Other abnormal findings on microbiological examination of urine: Secondary | ICD-10-CM | POA: Diagnosis not present

## 2021-10-21 ENCOUNTER — Other Ambulatory Visit: Payer: Self-pay

## 2021-10-21 ENCOUNTER — Ambulatory Visit: Payer: Medicare HMO | Admitting: Cardiology

## 2021-10-21 ENCOUNTER — Encounter: Payer: Self-pay | Admitting: Cardiology

## 2021-10-21 VITALS — BP 134/65 | HR 77 | Temp 98.3°F | Resp 16 | Ht 62.0 in | Wt 191.6 lb

## 2021-10-21 DIAGNOSIS — I493 Ventricular premature depolarization: Secondary | ICD-10-CM | POA: Diagnosis not present

## 2021-10-21 DIAGNOSIS — Z87891 Personal history of nicotine dependence: Secondary | ICD-10-CM | POA: Diagnosis not present

## 2021-10-21 DIAGNOSIS — I10 Essential (primary) hypertension: Secondary | ICD-10-CM | POA: Diagnosis not present

## 2021-10-21 DIAGNOSIS — R002 Palpitations: Secondary | ICD-10-CM

## 2021-10-21 DIAGNOSIS — I5032 Chronic diastolic (congestive) heart failure: Secondary | ICD-10-CM | POA: Diagnosis not present

## 2021-10-21 DIAGNOSIS — J449 Chronic obstructive pulmonary disease, unspecified: Secondary | ICD-10-CM | POA: Diagnosis not present

## 2021-10-21 DIAGNOSIS — E119 Type 2 diabetes mellitus without complications: Secondary | ICD-10-CM

## 2021-10-21 DIAGNOSIS — R0609 Other forms of dyspnea: Secondary | ICD-10-CM

## 2021-10-21 DIAGNOSIS — I350 Nonrheumatic aortic (valve) stenosis: Secondary | ICD-10-CM

## 2021-10-21 MED ORDER — LOSARTAN POTASSIUM 25 MG PO TABS: 25.0000 mg | ORAL_TABLET | Freq: Every day | ORAL | 0 refills | Status: DC

## 2021-10-21 MED ORDER — DILTIAZEM HCL ER COATED BEADS 240 MG PO CP24: 240.0000 mg | ORAL_CAPSULE | Freq: Every day | ORAL | 0 refills | Status: DC

## 2021-10-21 NOTE — Progress Notes (Signed)
[name] [name] Date of Birth: 08-Feb-1941 MRN: [id number] Primary Care Provider:Miller, Lattie Haw, MD Former Cardiology Providers: Jeri Lager, APRN, FNP-C Primary Cardiologist: Rex Kras, DO, [facility] (established care 05/06/2020)  Date: 10/21/21 Last Office Visit: 07/09/2021  Chief Complaint  Patient presents with   heart failure management   Follow-up   HPI  [name] [name] is a 81 y.o.  female who presents to the office with a chief complaint of "60-month follow-up for CHF management." Patient's past medical history and cardiovascular risk factors include: hypertension, hyperlipidemia, HFpEF/stage B/ NYHA II, COPD, OA, depression, history of breast cancer s/p lumpectomy in 2005 and radiation and chemotherapy in 7858, non-alcoholic cirrhosis of liver in 2015, and former tobacco use.  Patient is being followed for the management of HFpEF.  In the recent past she underwent both left and right heart catheterization details noted below for further reference but in summary noted to have normal epicardial coronary arteries, mild aortic stenosis, borderline pulmonary hypertension with a mean PAP 23 mmHg likely due to WHO class III disease.  Since establishing care patient's shortness of breath has improved markedly and is doing well at baseline.  She has not been hospitalized recently for heart failure exacerbation.  Her medications have also been uptitrated slowly given her shortness of breath, recurrent urinary tract infections, urinary urgency, etc.  Jardiance were discontinued secondary to recurrent yeast infection.  Since last office visit she continues to have urinary urgency and has been evaluated by urologist and is currently on the Myrbetriq.  She is also establish care with endocrinology for diabetes management.  At the last office visit patient states that she was found to have hypoxia nocturnally and was in the process of obtaining supplemental oxygen; however, this is still  pending.  Today she denies angina pectoris or heart failure symptoms.   FUNCTIONAL STATUS: No structured exercise program or daily routine.   ALLERGIES: Allergies  Allergen Reactions   Empagliflozin Other (See Comments)     MEDICATION LIST PRIOR TO VISIT: Current Outpatient Medications on File Prior to Visit  Medication Sig Dispense Refill   acetaminophen (TYLENOL) 500 MG tablet Take 500 mg by mouth every 6 (six) hours as needed.     albuterol (VENTOLIN HFA) 108 (90 Base) MCG/ACT inhaler Inhale 2 puffs into the lungs every 4 (four) hours as needed for wheezing or shortness of breath. 6.7 g 5   Cholecalciferol (D-3-5) 125 MCG (5000 UT) capsule Take 10,000 Units by mouth daily.     CINNAMON PO Take by mouth.     cyclobenzaprine (FLEXERIL) 5 MG tablet Take 1 tablet (5 mg total) by mouth 3 (three) times daily as needed for muscle spasms. 30 tablet 0   dimenhyDRINATE (DRAMAMINE) 50 MG tablet Take 50 mg by mouth every 8 (eight) hours as needed for nausea.     glipiZIDE (GLUCOTROL XL) 10 MG 24 hr tablet Take 10 mg by mouth daily with breakfast.     magnesium oxide (MAG-OX) 400 MG tablet Take 400 mg by mouth daily.     melatonin 3 MG TABS tablet Take 6 mg by mouth at bedtime.     metFORMIN (GLUCOPHAGE) 500 MG tablet Take 1 tablet (500 mg total) by mouth 2 (two) times daily with a meal. (Patient taking differently: Take 2 tablets by mouth 2 (two) times daily with a meal.) 180 tablet 1   Misc Natural Products (APPLE CIDER VINEGAR DIET PO) Take by mouth.     Multiple Vitamins-Minerals (ONE-A-DAY WOMENS 50+ ADVANTAGE  PO) Take 1 tablet by mouth daily.     rosuvastatin (CRESTOR) 10 MG tablet TAKE 1 TABLET BY MOUTH EVERYDAY AT BEDTIME 90 tablet 1   spironolactone (ALDACTONE) 25 MG tablet Take 1 tablet (25 mg total) by mouth daily. 90 tablet 1   traZODone (DESYREL) 50 MG tablet Take 1 tablet by mouth at bedtime as needed.     No current facility-administered medications on file prior to visit.     PAST MEDICAL HISTORY: Past Medical History:  Diagnosis Date   Aortic stenosis    Breast cancer (HCC)    Cancer (HCC)    Chest pain    Chronic kidney disease    Cirrhosis (HCC)    COPD (chronic obstructive pulmonary disease) (HCC)    DJD (degenerative joint disease)    GERD (gastroesophageal reflux disease)    Hyperlipidemia    Hypertension    Leg edema    Orthopnea    Palpitation    Personal history of radiation therapy    SOB (shortness of breath)    Vitamin D deficiency     PAST SURGICAL HISTORY: Past Surgical History:  Procedure Laterality Date   BREAST BIOPSY     BREAST LUMPECTOMY Left    CARDIAC CATHETERIZATION     CORONARY ANGIOPLASTY     LAPAROSCOPIC TOTAL HYSTERECTOMY     RIGHT/LEFT HEART CATH AND CORONARY ANGIOGRAPHY N/A 12/18/2020   Procedure: RIGHT/LEFT HEART CATH AND CORONARY ANGIOGRAPHY;  Surgeon: Nigel Mormon, MD;  Location: [facility];  Service: Cardiovascular;  Laterality: N/A;    FAMILY HISTORY: The patient's family history includes Cardiomyopathy in her father; Heart attack in her mother; Stroke in her mother.   SOCIAL HISTORY:  The patient  reports that she quit smoking about 18 years ago. Her smoking use included cigarettes. She started smoking about 60 years ago. She has a 15.00 pack-year smoking history. She has never used smokeless tobacco. She reports that she does not drink alcohol and does not use drugs.  Review of Systems  Constitutional: Negative for chills, fever and weight loss.  HENT:  Negative for hoarse voice and nosebleeds.   Eyes:  Negative for discharge, double vision and pain.  Cardiovascular:  Positive for dyspnea on exertion (chronic and stable). Negative for chest pain, claudication, leg swelling, near-syncope, orthopnea, palpitations, paroxysmal nocturnal dyspnea and syncope.  Respiratory:  Positive for snoring. Negative for hemoptysis and shortness of breath.   Musculoskeletal:  Positive for back pain and  joint pain. Negative for muscle cramps and myalgias.  Gastrointestinal:  Negative for abdominal pain, constipation, diarrhea, hematemesis, hematochezia, melena, nausea and vomiting.  Neurological:  Negative for dizziness and light-headedness.   PHYSICAL EXAM: Vitals with BMI 10/21/2021 09/25/2021 07/09/2021  Height 5\' 2"  - 5\' 2"   Weight 191 lbs 10 oz 190 lbs 189 lbs 3 oz  BMI 51.88 - 41.6  Systolic 606 301 601  Diastolic 65 68 71  Pulse 77 79 87   CONSTITUTIONAL: Appears older than stated age, hemodynamically stable, conversational dyspnea, well-nourished.   SKIN: Skin is warm and dry. No rash noted. No cyanosis. No pallor. No jaundice HEAD: Normocephalic and atraumatic.  EYES: No scleral icterus MOUTH/THROAT: Moist oral membranes.  NECK: No JVD present. No thyromegaly noted. No carotid bruits  LYMPHATIC: No visible cervical adenopathy.  CHEST Normal respiratory effort. No intercostal retractions  LUNGS: Clear to auscultation bilaterally.  No stridor. No wheezes. No rales.  CARDIOVASCULAR: Regular rate and rhythm, positive U9-N2, 3/6 systolic ejection murmur 2RICS,  soft holosystolic murmur at the apex, no gallops or rubs. ABDOMINAL: Obese, soft, nontender, nondistended, positive bowel sounds all 4 quadrants, no apparent ascites.  EXTREMITIES: No peripheral edema  HEMATOLOGIC: No significant bruising NEUROLOGIC: Oriented to person, place, and time. Nonfocal. Normal muscle tone.  PSYCHIATRIC: Normal mood and affect. Normal behavior. Cooperative  CARDIAC DATABASE: EKG: 10/21/2021: NSR, 72 bpm, left axis, left anterior fascicular block, consider old anteroseptal infarct, without underlying injury pattern.   Echocardiogram: 06/08/2021 1. Left ventricular ejection fraction, by estimation, is 50 to 55%. The left ventricle has low normal function. The left ventricle has no regional wall motion abnormalities. There is mild left ventricular hypertrophy. Left ventricular diastolic  parameters  were normal. 2. Right ventricular systolic function is normal. The right ventricular size is normal. 3. The mitral valve is normal in structure. Trivial mitral valve regurgitation. No evidence of mitral stenosis. 4. Native Aortic valve, not well visualized, no regurgitation, mild to moderate AS (peak velocity 2.86m/s, PG 79mmHG, MG11.33mmHg, AVA VTI 1.36cm2, DI 0.43).  Stress Testing:  Lexiscan Myoview stress test 03/12/2019: Lexiscan stress test was performed. Stress EKG is non-diagnostic, as this is pharmacological stress test. Normal myocardial perfusion, with mild uniform breast tissue attenuation in inferior myocardium. LVEF 53%. Low risk study.   Heart Catheterization: 12/18/2020: Angiographically normal coronary arteries No coronary artery disease Mild aortic stenosis, mean PG 9 mmHg Likely etiology of dyspnea is COPD and borderline PH (mPAP 23 mmHg), likely WHO Grp III  Carotid artery duplex  03/19/2019: No hemodynamically significant stenosis noted in bilateral internal carotid arteries.  Minimal heterogeneous plaque noted in the left ICA.   The right CCA velocity is minimally elevated but no significant plaque noted. Antegrade right vertebral artery flow. Antegrade left vertebral artery flow.  14 day extended Holter monitor: Dominant rhythm normal sinus rhythm.  Heart rate 58-187 bpm.  Avg HR 83 bpm. No atrial fibrillation, ventricular tachycardia, high grade AV block, pauses (3 seconds or longer). Total ventricular ectopic burden <1%. Total supraventricular ectopic burden <1%. Patient triggered events: 4.  Predominately normal sinus rhythm with one episode of SVT and occasional ventricular ectopy.   LABORATORY DATA: CBC Latest Ref Rng & Units 09/25/2021 12/18/2020 12/18/2020  WBC 4.0 - 10.5 K/uL 7.5 - -  Hemoglobin 12.0 - 15.0 g/dL 15.0 11.6(L) 11.9(L)  Hematocrit 36.0 - 46.0 % 43.1 34.0(L) 35.0(L)  Platelets 150 - 400 K/uL 130(L) - -    CMP Latest Ref Rng & Units  09/25/2021 06/29/2021 04/27/2021  Glucose 70 - 99 mg/dL 108(H) 154(H) 90  BUN 8 - 23 mg/dL 26(H) 21 18  Creatinine 0.44 - 1.00 mg/dL 0.92 1.04(H) 0.94  Sodium 135 - 145 mmol/L 137 140 139  Potassium 3.5 - 5.1 mmol/L 4.3 4.6 4.2  Chloride 98 - 111 mmol/L 105 103 103  CO2 22 - 32 mmol/L 22 22 21   Calcium 8.9 - 10.3 mg/dL 10.8(H) 10.3 10.0  Total Protein 6.5 - 8.1 g/dL 7.7 - -  Total Bilirubin 0.3 - 1.2 mg/dL 1.5(H) - -  Alkaline Phos 38 - 126 U/L 44 - -  AST 15 - 41 U/L 44(H) - -  ALT 0 - 44 U/L 21 - -    Lipid Panel     Component Value Date/Time   CHOL 166 12/09/2020 1035   TRIG 125 12/09/2020 1035   HDL 47 12/09/2020 1035   LDLCALC 97 12/09/2020 1035   LDLDIRECT 104 (H) 12/09/2020 1035   LABVLDL 22 12/09/2020 1035    No results  found for: HGBA1C No components found for: NTPROBNP No results found for: TSH  Cardiac Panel (last 3 results) No results for input(s): CKTOTAL, CKMB, TROPONINIHS, RELINDX in the last 72 hours.   External Labs: Collected: 04/09/2021 provided by PCP Creatinine 1.13 mg/dL. Sodium 132, potassium 3.2, chloride 94, bicarb 25 AST 32, ALT 27, alkaline phosphatase 54 Hemoglobin A1c 9.5 Total cholesterol 121, triglycerides 150, HDL 53, 8 LDL 43, non-HDL 68  IMPRESSION:    ICD-10-CM   1. Chronic heart failure with preserved ejection fraction (HFpEF) (HCC)  I50.32 EKG 12-Lead    losartan (COZAAR) 25 MG tablet    Basic metabolic panel    Magnesium    2. Dyspnea on exertion  R06.09 diltiazem (CARDIZEM CD) 240 MG 24 hr capsule    3. PVC's (premature ventricular contractions)  I49.3 diltiazem (CARDIZEM CD) 240 MG 24 hr capsule    4. Palpitations  R00.2 diltiazem (CARDIZEM CD) 240 MG 24 hr capsule    5. Mild aortic stenosis  I35.0     6. Non-insulin dependent type 2 diabetes mellitus ([place])  E11.9     7. Benign hypertension  I10     8. Chronic obstructive pulmonary disease, unspecified COPD type ([place])  J44.9     9. Former smoker  Z87.891         RECOMMENDATIONS: [name] [name] is a 81 y.o. female whose past medical history and cardiovascular risk factors include: hypertension, hyperlipidemia, non-insulin-dependent diabetes mellitus type 2, HFpEF/stage B/ NYHA II, COPD, OA, depression, history of breast cancer s/p lumpectomy in 2005 and radiation and chemotherapy in 1478, non-alcoholic cirrhosis of liver in 2015, and former tobacco use.  Chronic heart failure with preserved ejection fraction (HFpEF) (HCC) Chronic and stable. Stage B, NYHA class II/III Currently euvolemic on physical examination. Was diagnosed with HF prior to establishing care with myself in August 2021 Slowly uptitrating GDMT given her age, renal function, history of urinary tract infections, and urinary urgency. Start losartan 25 mg p.o. daily. Has tolerated spironolactone well without any side effects or intolerances. Jardiance discontinued due to yeast infections x2. Initial plan was to add Entresto 24/26 mg p.o. twice daily.  However due to her recent urinary urgency requiring urology evaluation we will hold off on additional diuretic therapy at this time.  Therefore we will start losartan and if remains stable we will transition her to [facility] at the next visit  Dyspnea on exertion Multifactorial.  Chronic and stable. Chronic HFpEF management as discussed above. We will defer management of her COPD to pulmonary medicine. Noted to have borderline PH likely due to [facility] class III.  Patient was in the process of obtaining nocturnal oxygen supplementation.  Still pending.  Palpitations/PVC's (premature ventricular contractions) Improved. Has tolerated Cardizem 240 mg p.o. daily very well. Prescription refilled.  Mild aortic stenosis Mild to moderate in severity based on recent echocardiogram.   Asymptomatic Patient is cognizant of the 3 cardinal signs of progressive aortic stenosis which include heart failure symptoms, syncope, and chest pain.  FINAL  MEDICATION LIST END OF ENCOUNTER: Meds ordered this encounter  Medications   diltiazem (CARDIZEM CD) 240 MG 24 hr capsule    Sig: Take 1 capsule (240 mg total) by mouth daily.    Dispense:  90 capsule    Refill:  0   losartan (COZAAR) 25 MG tablet    Sig: Take 1 tablet (25 mg total) by mouth daily.    Dispense:  30 tablet    Refill:  0  Current Outpatient Medications:    acetaminophen (TYLENOL) 500 MG tablet, Take 500 mg by mouth every 6 (six) hours as needed., Disp: , Rfl:    albuterol (VENTOLIN HFA) 108 (90 Base) MCG/ACT inhaler, Inhale 2 puffs into the lungs every 4 (four) hours as needed for wheezing or shortness of breath., Disp: 6.7 g, Rfl: 5   Cholecalciferol (D-3-5) 125 MCG (5000 UT) capsule, Take 10,000 Units by mouth daily., Disp: , Rfl:    CINNAMON PO, Take by mouth., Disp: , Rfl:    cyclobenzaprine (FLEXERIL) 5 MG tablet, Take 1 tablet (5 mg total) by mouth 3 (three) times daily as needed for muscle spasms., Disp: 30 tablet, Rfl: 0   dimenhyDRINATE (DRAMAMINE) 50 MG tablet, Take 50 mg by mouth every 8 (eight) hours as needed for nausea., Disp: , Rfl:    glipiZIDE (GLUCOTROL XL) 10 MG 24 hr tablet, Take 10 mg by mouth daily with breakfast., Disp: , Rfl:    losartan (COZAAR) 25 MG tablet, Take 1 tablet (25 mg total) by mouth daily., Disp: 30 tablet, Rfl: 0   magnesium oxide (MAG-OX) 400 MG tablet, Take 400 mg by mouth daily., Disp: , Rfl:    melatonin 3 MG TABS tablet, Take 6 mg by mouth at bedtime., Disp: , Rfl:    metFORMIN (GLUCOPHAGE) 500 MG tablet, Take 1 tablet (500 mg total) by mouth 2 (two) times daily with a meal. (Patient taking differently: Take 2 tablets by mouth 2 (two) times daily with a meal.), Disp: 180 tablet, Rfl: 1   Misc Natural Products (APPLE CIDER VINEGAR DIET PO), Take by mouth., Disp: , Rfl:    Multiple Vitamins-Minerals (ONE-A-DAY WOMENS 50+ ADVANTAGE PO), Take 1 tablet by mouth daily., Disp: , Rfl:    rosuvastatin (CRESTOR) 10 MG tablet, TAKE 1  TABLET BY MOUTH EVERYDAY AT BEDTIME, Disp: 90 tablet, Rfl: 1   spironolactone (ALDACTONE) 25 MG tablet, Take 1 tablet (25 mg total) by mouth daily., Disp: 90 tablet, Rfl: 1   traZODone (DESYREL) 50 MG tablet, Take 1 tablet by mouth at bedtime as needed., Disp: , Rfl:    diltiazem (CARDIZEM CD) 240 MG 24 hr capsule, Take 1 capsule (240 mg total) by mouth daily., Disp: 90 capsule, Rfl: 0  Orders Placed This Encounter  Procedures   Basic metabolic panel   Magnesium   EKG 12-Lead   --Continue cardiac medications as reconciled in final medication list. --Return in about 6 months (around 04/20/2022) for Follow up HFpeF, Dyspena. . Or sooner if needed. --Continue follow-up with your primary care physician regarding the management of your other chronic comorbid conditions.  Patient's questions and concerns were addressed to her satisfaction. She voices understanding of the instructions provided during this encounter.   This note was created using a voice recognition software as a result there may be grammatical errors inadvertently enclosed that do not reflect the nature of this encounter. Every attempt is made to correct such errors.  Rex Kras, [place], [facility]  Pager: [phone] Office: [phone]

## 2021-10-28 DIAGNOSIS — I10 Essential (primary) hypertension: Secondary | ICD-10-CM | POA: Diagnosis not present

## 2021-10-28 DIAGNOSIS — E1169 Type 2 diabetes mellitus with other specified complication: Secondary | ICD-10-CM | POA: Diagnosis not present

## 2021-10-28 DIAGNOSIS — E7800 Pure hypercholesterolemia, unspecified: Secondary | ICD-10-CM | POA: Diagnosis not present

## 2021-10-28 DIAGNOSIS — J449 Chronic obstructive pulmonary disease, unspecified: Secondary | ICD-10-CM | POA: Diagnosis not present

## 2021-10-29 ENCOUNTER — Other Ambulatory Visit: Payer: Self-pay | Admitting: Cardiology

## 2021-10-29 DIAGNOSIS — I5032 Chronic diastolic (congestive) heart failure: Secondary | ICD-10-CM

## 2021-10-29 DIAGNOSIS — R0609 Other forms of dyspnea: Secondary | ICD-10-CM

## 2021-10-30 ENCOUNTER — Other Ambulatory Visit: Payer: Self-pay

## 2021-10-30 DIAGNOSIS — I5032 Chronic diastolic (congestive) heart failure: Secondary | ICD-10-CM | POA: Diagnosis not present

## 2021-10-30 DIAGNOSIS — E1165 Type 2 diabetes mellitus with hyperglycemia: Secondary | ICD-10-CM | POA: Diagnosis not present

## 2021-10-31 LAB — BASIC METABOLIC PANEL
BUN/Creatinine Ratio: 20 (ref 12–28)
BUN: 21 mg/dL (ref 8–27)
CO2: 23 mmol/L (ref 20–29)
Calcium: 10.2 mg/dL (ref 8.7–10.3)
Chloride: 102 mmol/L (ref 96–106)
Creatinine, Ser: 1.06 mg/dL — ABNORMAL HIGH (ref 0.57–1.00)
Glucose: 109 mg/dL — ABNORMAL HIGH (ref 70–99)
Potassium: 4.7 mmol/L (ref 3.5–5.2)
Sodium: 137 mmol/L (ref 134–144)
eGFR: 54 mL/min/{1.73_m2} — ABNORMAL LOW (ref >59)

## 2021-10-31 LAB — MAGNESIUM: Magnesium: 2 mg/dL (ref 1.6–2.3)

## 2021-11-05 ENCOUNTER — Other Ambulatory Visit: Payer: Self-pay | Admitting: Cardiology

## 2021-11-05 DIAGNOSIS — I5032 Chronic diastolic (congestive) heart failure: Secondary | ICD-10-CM

## 2021-11-06 NOTE — Progress Notes (Signed)
Called and spoke with patient regarding her lab results. She will increase her water intake as directed.

## 2021-11-16 DIAGNOSIS — N3281 Overactive bladder: Secondary | ICD-10-CM | POA: Diagnosis not present

## 2021-12-11 ENCOUNTER — Other Ambulatory Visit: Payer: Self-pay | Admitting: Family Medicine

## 2021-12-11 DIAGNOSIS — M810 Age-related osteoporosis without current pathological fracture: Secondary | ICD-10-CM

## 2021-12-16 ENCOUNTER — Other Ambulatory Visit: Payer: Medicare HMO

## 2021-12-16 ENCOUNTER — Ambulatory Visit: Payer: Medicare HMO

## 2021-12-18 ENCOUNTER — Other Ambulatory Visit: Payer: Self-pay

## 2021-12-18 ENCOUNTER — Other Ambulatory Visit: Payer: Self-pay | Admitting: Cardiology

## 2021-12-18 ENCOUNTER — Telehealth: Payer: Self-pay | Admitting: Cardiology

## 2021-12-18 DIAGNOSIS — I493 Ventricular premature depolarization: Secondary | ICD-10-CM

## 2021-12-18 DIAGNOSIS — R002 Palpitations: Secondary | ICD-10-CM

## 2021-12-18 DIAGNOSIS — R0609 Other forms of dyspnea: Secondary | ICD-10-CM

## 2021-12-18 MED ORDER — DILTIAZEM HCL ER COATED BEADS 240 MG PO CP24: 240.0000 mg | ORAL_CAPSULE | Freq: Every day | ORAL | 1 refills | Status: DC

## 2021-12-18 NOTE — Telephone Encounter (Signed)
Medication has been refilled.

## 2021-12-18 NOTE — Telephone Encounter (Signed)
Pt req refill for below meds  ? ?diltiazem (CARDIZEM CD) 240 MG 24 hr capsule\ ? ?[facility] - [place], [place] - [address]  [phone] ?

## 2021-12-22 ENCOUNTER — Other Ambulatory Visit: Payer: Self-pay | Admitting: Cardiology

## 2021-12-22 DIAGNOSIS — I5032 Chronic diastolic (congestive) heart failure: Secondary | ICD-10-CM

## 2022-01-12 ENCOUNTER — Other Ambulatory Visit: Payer: Self-pay | Admitting: Family Medicine

## 2022-01-12 DIAGNOSIS — R0989 Other specified symptoms and signs involving the circulatory and respiratory systems: Secondary | ICD-10-CM

## 2022-01-13 ENCOUNTER — Other Ambulatory Visit: Payer: Self-pay | Admitting: Cardiology

## 2022-01-13 DIAGNOSIS — I5032 Chronic diastolic (congestive) heart failure: Secondary | ICD-10-CM

## 2022-01-21 ENCOUNTER — Other Ambulatory Visit: Payer: Medicare HMO

## 2022-01-28 ENCOUNTER — Other Ambulatory Visit: Payer: Medicare HMO

## 2022-02-03 ENCOUNTER — Ambulatory Visit
Admission: RE | Admit: 2022-02-03 | Discharge: 2022-02-03 | Disposition: A | Discharge status: Home / Self Care | Payer: Medicare HMO | Source: Ambulatory Visit | Attending: *Deleted | Admitting: *Deleted

## 2022-02-03 DIAGNOSIS — R0989 Other specified symptoms and signs involving the circulatory and respiratory systems: Secondary | ICD-10-CM

## 2022-02-09 ENCOUNTER — Other Ambulatory Visit: Payer: Self-pay | Admitting: Cardiology

## 2022-02-09 DIAGNOSIS — I5032 Chronic diastolic (congestive) heart failure: Secondary | ICD-10-CM

## 2022-02-22 ENCOUNTER — Other Ambulatory Visit: Payer: Self-pay

## 2022-02-22 DIAGNOSIS — I5032 Chronic diastolic (congestive) heart failure: Secondary | ICD-10-CM

## 2022-02-22 DIAGNOSIS — N179 Acute kidney failure, unspecified: Secondary | ICD-10-CM

## 2022-03-05 ENCOUNTER — Other Ambulatory Visit: Payer: Self-pay | Admitting: Cardiology

## 2022-03-05 DIAGNOSIS — I5032 Chronic diastolic (congestive) heart failure: Secondary | ICD-10-CM

## 2022-03-10 DIAGNOSIS — Z8249 Family history of ischemic heart disease and other diseases of the circulatory system: Secondary | ICD-10-CM | POA: Diagnosis not present

## 2022-03-10 DIAGNOSIS — Z823 Family history of stroke: Secondary | ICD-10-CM | POA: Diagnosis not present

## 2022-03-10 DIAGNOSIS — E1136 Type 2 diabetes mellitus with diabetic cataract: Secondary | ICD-10-CM | POA: Diagnosis not present

## 2022-03-10 DIAGNOSIS — E785 Hyperlipidemia, unspecified: Secondary | ICD-10-CM | POA: Diagnosis not present

## 2022-03-10 DIAGNOSIS — Z853 Personal history of malignant neoplasm of breast: Secondary | ICD-10-CM | POA: Diagnosis not present

## 2022-03-10 DIAGNOSIS — Z7984 Long term (current) use of oral hypoglycemic drugs: Secondary | ICD-10-CM | POA: Diagnosis not present

## 2022-03-10 DIAGNOSIS — Z87891 Personal history of nicotine dependence: Secondary | ICD-10-CM | POA: Diagnosis not present

## 2022-03-10 DIAGNOSIS — I700 Atherosclerosis of aorta: Secondary | ICD-10-CM | POA: Diagnosis not present

## 2022-03-10 DIAGNOSIS — I509 Heart failure, unspecified: Secondary | ICD-10-CM | POA: Diagnosis not present

## 2022-03-10 DIAGNOSIS — I110 Hypertensive heart disease with heart failure: Secondary | ICD-10-CM | POA: Diagnosis not present

## 2022-03-10 DIAGNOSIS — Z8052 Family history of malignant neoplasm of bladder: Secondary | ICD-10-CM | POA: Diagnosis not present

## 2022-03-10 DIAGNOSIS — I471 Supraventricular tachycardia: Secondary | ICD-10-CM | POA: Diagnosis not present

## 2022-03-10 DIAGNOSIS — Z811 Family history of alcohol abuse and dependence: Secondary | ICD-10-CM | POA: Diagnosis not present

## 2022-03-10 DIAGNOSIS — J449 Chronic obstructive pulmonary disease, unspecified: Secondary | ICD-10-CM | POA: Diagnosis not present

## 2022-03-10 DIAGNOSIS — F3341 Major depressive disorder, recurrent, in partial remission: Secondary | ICD-10-CM | POA: Diagnosis not present

## 2022-03-15 ENCOUNTER — Ambulatory Visit: Payer: Medicare HMO | Admitting: Cardiology

## 2022-04-12 ENCOUNTER — Other Ambulatory Visit: Payer: Self-pay | Admitting: Cardiology

## 2022-04-12 DIAGNOSIS — I5032 Chronic diastolic (congestive) heart failure: Secondary | ICD-10-CM

## 2022-04-22 ENCOUNTER — Ambulatory Visit: Payer: Medicare HMO | Admitting: Cardiology

## 2022-04-29 DIAGNOSIS — I493 Ventricular premature depolarization: Secondary | ICD-10-CM | POA: Diagnosis not present

## 2022-04-29 DIAGNOSIS — I5032 Chronic diastolic (congestive) heart failure: Secondary | ICD-10-CM | POA: Diagnosis not present

## 2022-04-30 LAB — BASIC METABOLIC PANEL
BUN/Creatinine Ratio: 18 (ref 12–28)
BUN: 27 mg/dL (ref 8–27)
CO2: 17 mmol/L — ABNORMAL LOW (ref 20–29)
Calcium: 9.8 mg/dL (ref 8.7–10.3)
Chloride: 104 mmol/L (ref 96–106)
Creatinine, Ser: 1.54 mg/dL — ABNORMAL HIGH (ref 0.57–1.00)
Glucose: 119 mg/dL — ABNORMAL HIGH (ref 70–99)
Potassium: 4.7 mmol/L (ref 3.5–5.2)
Sodium: 137 mmol/L (ref 134–144)
eGFR: 34 mL/min/{1.73_m2} — ABNORMAL LOW (ref >59)

## 2022-04-30 LAB — PRO B NATRIURETIC PEPTIDE: NT-Pro BNP: 875 pg/mL — ABNORMAL HIGH (ref 0–738)

## 2022-04-30 LAB — MAGNESIUM: Magnesium: 2.1 mg/dL (ref 1.6–2.3)

## 2022-05-04 ENCOUNTER — Ambulatory Visit: Payer: Medicare HMO | Admitting: Cardiology

## 2022-05-10 ENCOUNTER — Other Ambulatory Visit: Payer: Self-pay | Admitting: Cardiology

## 2022-05-10 DIAGNOSIS — I5032 Chronic diastolic (congestive) heart failure: Secondary | ICD-10-CM

## 2022-05-11 ENCOUNTER — Ambulatory Visit: Payer: Medicare HMO | Admitting: Cardiology

## 2022-05-21 DIAGNOSIS — N3281 Overactive bladder: Secondary | ICD-10-CM | POA: Diagnosis not present

## 2022-05-21 DIAGNOSIS — N179 Acute kidney failure, unspecified: Secondary | ICD-10-CM | POA: Diagnosis not present

## 2022-05-21 DIAGNOSIS — I5032 Chronic diastolic (congestive) heart failure: Secondary | ICD-10-CM | POA: Diagnosis not present

## 2022-05-21 DIAGNOSIS — N3941 Urge incontinence: Secondary | ICD-10-CM | POA: Diagnosis not present

## 2022-05-21 DIAGNOSIS — R351 Nocturia: Secondary | ICD-10-CM | POA: Diagnosis not present

## 2022-05-22 LAB — BASIC METABOLIC PANEL
BUN/Creatinine Ratio: 18 (ref 12–28)
BUN: 32 mg/dL — ABNORMAL HIGH (ref 8–27)
CO2: 21 mmol/L (ref 20–29)
Calcium: 10.2 mg/dL (ref 8.7–10.3)
Chloride: 103 mmol/L (ref 96–106)
Creatinine, Ser: 1.78 mg/dL — ABNORMAL HIGH (ref 0.57–1.00)
Glucose: 108 mg/dL — ABNORMAL HIGH (ref 70–99)
Potassium: 4.8 mmol/L (ref 3.5–5.2)
Sodium: 139 mmol/L (ref 134–144)
eGFR: 29 mL/min/{1.73_m2} — ABNORMAL LOW (ref >59)

## 2022-05-22 LAB — MAGNESIUM: Magnesium: 2.3 mg/dL (ref 1.6–2.3)

## 2022-05-24 NOTE — Progress Notes (Signed)
Dr. Chapman Fitch and Milford Cage,   Reaching out regarding our mutual patient.  I have been following her labs and have noticed her Cr continues to uptrend.  This is after holding her losartan (she was fine with it before).  Dr. Milford Cage not sure if there is post-renal issues contributing to her renal function.  Dr. Chapman Fitch please see if she needs to see nephrology or other recommendations.   I have spoke to the patient and recommended to reach out to your office respectively.   Thank-you.   Dr. Terri Skains

## 2022-05-25 ENCOUNTER — Ambulatory Visit: Payer: Medicare HMO | Admitting: Cardiology

## 2022-05-25 DIAGNOSIS — D696 Thrombocytopenia, unspecified: Secondary | ICD-10-CM | POA: Diagnosis not present

## 2022-05-25 DIAGNOSIS — J449 Chronic obstructive pulmonary disease, unspecified: Secondary | ICD-10-CM | POA: Diagnosis not present

## 2022-05-25 DIAGNOSIS — E1142 Type 2 diabetes mellitus with diabetic polyneuropathy: Secondary | ICD-10-CM | POA: Diagnosis not present

## 2022-05-25 DIAGNOSIS — K7460 Unspecified cirrhosis of liver: Secondary | ICD-10-CM | POA: Diagnosis not present

## 2022-05-25 DIAGNOSIS — E1122 Type 2 diabetes mellitus with diabetic chronic kidney disease: Secondary | ICD-10-CM | POA: Diagnosis not present

## 2022-05-25 DIAGNOSIS — M4850XA Collapsed vertebra, not elsewhere classified, site unspecified, initial encounter for fracture: Secondary | ICD-10-CM | POA: Diagnosis not present

## 2022-05-25 DIAGNOSIS — I272 Pulmonary hypertension, unspecified: Secondary | ICD-10-CM | POA: Diagnosis not present

## 2022-05-25 DIAGNOSIS — I700 Atherosclerosis of aorta: Secondary | ICD-10-CM | POA: Diagnosis not present

## 2022-05-28 ENCOUNTER — Ambulatory Visit: Payer: Medicare HMO | Admitting: Cardiology

## 2022-06-09 ENCOUNTER — Ambulatory Visit
Admission: RE | Admit: 2022-06-09 | Discharge: 2022-06-09 | Disposition: A | Discharge status: Home / Self Care | Payer: Medicare HMO | Source: Ambulatory Visit | Attending: Family Medicine | Admitting: Family Medicine

## 2022-06-09 DIAGNOSIS — Z780 Asymptomatic menopausal state: Secondary | ICD-10-CM | POA: Diagnosis not present

## 2022-06-09 DIAGNOSIS — Z1231 Encounter for screening mammogram for malignant neoplasm of breast: Secondary | ICD-10-CM | POA: Diagnosis not present

## 2022-06-09 DIAGNOSIS — M810 Age-related osteoporosis without current pathological fracture: Secondary | ICD-10-CM

## 2022-06-22 ENCOUNTER — Other Ambulatory Visit: Payer: Self-pay | Admitting: Cardiology

## 2022-06-22 DIAGNOSIS — Z0289 Encounter for other administrative examinations: Secondary | ICD-10-CM | POA: Diagnosis not present

## 2022-06-22 DIAGNOSIS — E1142 Type 2 diabetes mellitus with diabetic polyneuropathy: Secondary | ICD-10-CM | POA: Diagnosis not present

## 2022-06-22 DIAGNOSIS — I5032 Chronic diastolic (congestive) heart failure: Secondary | ICD-10-CM

## 2022-06-22 DIAGNOSIS — R0609 Other forms of dyspnea: Secondary | ICD-10-CM

## 2022-06-22 DIAGNOSIS — R001 Bradycardia, unspecified: Secondary | ICD-10-CM | POA: Diagnosis not present

## 2022-06-22 DIAGNOSIS — Z853 Personal history of malignant neoplasm of breast: Secondary | ICD-10-CM | POA: Diagnosis not present

## 2022-06-22 DIAGNOSIS — E1122 Type 2 diabetes mellitus with diabetic chronic kidney disease: Secondary | ICD-10-CM | POA: Diagnosis not present

## 2022-07-05 ENCOUNTER — Ambulatory Visit: Payer: Medicare HMO | Admitting: Cardiology

## 2022-07-05 ENCOUNTER — Encounter: Payer: Self-pay | Admitting: Cardiology

## 2022-07-05 VITALS — BP 126/69 | HR 80 | Resp 16 | Ht 62.0 in | Wt 189.0 lb

## 2022-07-05 DIAGNOSIS — I5032 Chronic diastolic (congestive) heart failure: Secondary | ICD-10-CM | POA: Diagnosis not present

## 2022-07-05 DIAGNOSIS — R0609 Other forms of dyspnea: Secondary | ICD-10-CM | POA: Diagnosis not present

## 2022-07-05 DIAGNOSIS — Z87891 Personal history of nicotine dependence: Secondary | ICD-10-CM | POA: Diagnosis not present

## 2022-07-05 DIAGNOSIS — I10 Essential (primary) hypertension: Secondary | ICD-10-CM

## 2022-07-05 DIAGNOSIS — E119 Type 2 diabetes mellitus without complications: Secondary | ICD-10-CM

## 2022-07-05 DIAGNOSIS — I350 Nonrheumatic aortic (valve) stenosis: Secondary | ICD-10-CM

## 2022-07-05 DIAGNOSIS — J449 Chronic obstructive pulmonary disease, unspecified: Secondary | ICD-10-CM | POA: Diagnosis not present

## 2022-07-05 DIAGNOSIS — I493 Ventricular premature depolarization: Secondary | ICD-10-CM

## 2022-07-05 MED ORDER — LOSARTAN POTASSIUM 25 MG PO TABS: 25.0000 mg | ORAL_TABLET | Freq: Every day | ORAL | 0 refills | Status: DC

## 2022-07-05 NOTE — Progress Notes (Signed)
[name] Date of Birth: [birth year] MRN: [id number] Primary Care Provider:Fulp, Ander Gaster, MD Former Cardiology Providers: Jeri Lager, APRN, FNP-C Primary Cardiologist: Rex Kras, DO, [facility] (established care 05/06/2020)  Date: 07/05/22 Last Office Visit: 10/21/2021  Chief Complaint  Patient presents with   Follow-up    6 month Shortness of breath and HFpEF   HPI  [name] [name] is a 81 y.o.  female whose past medical history and cardiovascular risk factors include: hypertension, hyperlipidemia, HFpEF/stage B/ NYHA II, COPD, OA, depression, history of breast cancer s/p lumpectomy in 2005 and radiation and chemotherapy in 8416, non-alcoholic cirrhosis of liver in 2015, and former tobacco use.  Patient is being evaluated given her underlying dyspnea and subsequently underwent ischemic work-up.  This led to left and right heart catheterization.  She was noted to have normal epicardial coronary arteries, mild aortic stenosis, borderline pulmonary hypertension with a mean PAP of 23 mmHg likely secondary to WHO class III disease.  Her GDMT was uptitrated slowly given her underlying chronic kidney disease.  Initial plan was to start her on Entresto but given her renal function patient was on losartan but later discontinued due to AKI.  Her Jardiance was discontinued secondary to recurrent yeast infections as well.  She is been followed by urology for overactive bladder and plans to follow-up with nephrology.  Patient followed up with PCP on 06/22/2022 and had a surface EKG performed.  The underlying rhythm was sinus bradycardia with A-V dissociation and ventricular escape beats.  At that time she was asymptomatic and refused to go to the ED.  Therefore her PCP checked labs on an outpatient basis and her diltiazem 240 mg p.o. daily was discontinued.  Following day I called a [facility] and spoke to Dr. Harless Litten who updated me that the patient's home blood pressure 125/68 and a  pulse of 70.  Since then patient states that she remains asymptomatic.  Denies chest pain or heart failure symptoms.  Shortness of breath present but overall improved.    FUNCTIONAL STATUS: No structured exercise program or daily routine.   ALLERGIES: Allergies  Allergen Reactions   Empagliflozin Other (See Comments)     MEDICATION LIST PRIOR TO VISIT: Current Outpatient Medications on File Prior to Visit  Medication Sig Dispense Refill   acetaminophen (TYLENOL) 500 MG tablet Take 500 mg by mouth every 6 (six) hours as needed.     albuterol (VENTOLIN HFA) 108 (90 Base) MCG/ACT inhaler Inhale 2 puffs into the lungs every 4 (four) hours as needed for wheezing or shortness of breath. 6.7 g 5   Calcium Polycarbophil (FIBER) 625 MG TABS 2 tablets as needed Orally Three times a day     Cholecalciferol (D-3-5) 125 MCG (5000 UT) capsule Take 10,000 Units by mouth daily.     CINNAMON PO Take by mouth.     cyclobenzaprine (FLEXERIL) 5 MG tablet Take 1 tablet (5 mg total) by mouth 3 (three) times daily as needed for muscle spasms. 30 tablet 0   dimenhyDRINATE (DRAMAMINE) 50 MG tablet Take 50 mg by mouth every 8 (eight) hours as needed for nausea.     Docusate Sodium (DSS) 100 MG CAPS 1 capsule as needed Orally Once a day for 30 day(s)     magnesium oxide (MAG-OX) 400 MG tablet Take 400 mg by mouth daily.     melatonin 3 MG TABS tablet Take 6 mg by mouth at bedtime.     metFORMIN (GLUCOPHAGE) 500 MG tablet Take 1  tablet (500 mg total) by mouth 2 (two) times daily with a meal. (Patient taking differently: Take 2 tablets by mouth 2 (two) times daily with a meal.) 180 tablet 1   Misc Natural Products (APPLE CIDER VINEGAR DIET PO) Take by mouth.     Multiple Vitamins-Minerals (ONE-A-DAY WOMENS 50+ ADVANTAGE PO) Take 1 tablet by mouth daily.     rosuvastatin (CRESTOR) 10 MG tablet TAKE 1 TABLET BY MOUTH EVERYDAY AT BEDTIME 90 tablet 1   solifenacin (VESICARE) 10 MG tablet Take 10 mg by mouth daily.      spironolactone (ALDACTONE) 25 MG tablet TAKE ONE TABLET BY MOUTH EVERY MORNING 90 tablet 1   traZODone (DESYREL) 50 MG tablet Take 1 tablet by mouth at bedtime as needed.     No current facility-administered medications on file prior to visit.    PAST MEDICAL HISTORY: Past Medical History:  Diagnosis Date   Aortic stenosis    Breast cancer (HCC)    Cancer (HCC)    Chest pain    Chronic kidney disease    Cirrhosis (HCC)    COPD (chronic obstructive pulmonary disease) (HCC)    DJD (degenerative joint disease)    GERD (gastroesophageal reflux disease)    Hyperlipidemia    Hypertension    Leg edema    Orthopnea    Palpitation    Personal history of radiation therapy    SOB (shortness of breath)    Vitamin D deficiency     PAST SURGICAL HISTORY: Past Surgical History:  Procedure Laterality Date   BREAST BIOPSY     BREAST LUMPECTOMY Left    CARDIAC CATHETERIZATION     CORONARY ANGIOPLASTY     LAPAROSCOPIC TOTAL HYSTERECTOMY     RIGHT/LEFT HEART CATH AND CORONARY ANGIOGRAPHY N/A 12/18/2020   Procedure: RIGHT/LEFT HEART CATH AND CORONARY ANGIOGRAPHY;  Surgeon: Nigel Mormon, MD;  Location: [facility];  Service: Cardiovascular;  Laterality: N/A;    FAMILY HISTORY: The patient's family history includes Cardiomyopathy in her father; Heart attack in her mother; Stroke in her mother.   SOCIAL HISTORY:  The patient  reports that she quit smoking about 18 years ago. Her smoking use included cigarettes. She started smoking about 61 years ago. She has a 15.00 pack-year smoking history. She has never used smokeless tobacco. She reports that she does not drink alcohol and does not use drugs.  Review of Systems  Constitutional: Negative for chills, fever and weight loss.  HENT:  Negative for hoarse voice and nosebleeds.   Eyes:  Negative for discharge, double vision and pain.  Cardiovascular:  Positive for dyspnea on exertion (chronic and stable). Negative for chest  pain, claudication, leg swelling, near-syncope, orthopnea, palpitations, paroxysmal nocturnal dyspnea and syncope.  Respiratory:  Positive for snoring. Negative for hemoptysis and shortness of breath.   Musculoskeletal:  Positive for back pain and joint pain. Negative for muscle cramps and myalgias.  Gastrointestinal:  Negative for abdominal pain, constipation, diarrhea, hematemesis, hematochezia, melena, nausea and vomiting.  Neurological:  Negative for dizziness and light-headedness.    PHYSICAL EXAM:    07/05/2022   12:36 PM 10/21/2021   10:59 AM 09/25/2021    1:14 PM  Vitals with BMI  Height _0  _1    Weight 189 lbs 191 lbs 10 oz 190 lbs  BMI 09.23 30.07   Systolic 622 633 354  Diastolic 69 65 68  Pulse 80 77 79   Physical Exam  Constitutional: No distress.  Age appropriate,  hemodynamically stable.   Neck: No JVD present.  Cardiovascular: Normal rate, regular rhythm, S1 normal, S2 normal, intact distal pulses and normal pulses. Exam reveals no gallop, no S3 and no S4.  Murmur heard. Harsh midsystolic murmur is present with a grade of 3/6 at the upper right sternal border. Pulmonary/Chest: Effort normal and breath sounds normal. No stridor. She has no wheezes. She has no rales.  Abdominal: Soft. Bowel sounds are normal. She exhibits no distension. There is no abdominal tenderness.  Musculoskeletal:        General: No edema.     Cervical back: Neck supple.  Neurological: She is alert and oriented to person, place, and time. She has intact cranial nerves (2-12).  Skin: Skin is warm and moist.   CARDIAC DATABASE: EKG: 07/05/2022: Sinus rhythm, 83 bpm, right bundle branch block, left axis, left anterior fascicular block, LAE, without underlying injury pattern.  Echocardiogram: 06/08/2021 1. Left ventricular ejection fraction, by estimation, is 50 to 55%. The left ventricle has low normal function. The left ventricle has no regional wall motion abnormalities. There is mild  left ventricular hypertrophy. Left ventricular diastolic  parameters were normal. 2. Right ventricular systolic function is normal. The right ventricular size is normal. 3. The mitral valve is normal in structure. Trivial mitral valve regurgitation. No evidence of mitral stenosis. 4. Native Aortic valve, not well visualized, no regurgitation, mild to moderate AS (peak velocity 2.77ms, PG 220mG, MG11.55m50m, AVA VTI 1.36cm2, DI 0.43).  Stress Testing:  Lexiscan Myoview stress test 03/12/2019: Lexiscan stress test was performed. Stress EKG is non-diagnostic, as this is pharmacological stress test. Normal myocardial perfusion, with mild uniform breast tissue attenuation in inferior myocardium. LVEF 53%. Low risk study.   Heart Catheterization: 12/18/2020: Angiographically normal coronary arteries No coronary artery disease Mild aortic stenosis, mean PG 9 mmHg Likely etiology of dyspnea is COPD and borderline PH (mPAP 23 mmHg), likely WHO Grp III  Carotid artery duplex  03/19/2019: No hemodynamically significant stenosis noted in bilateral internal carotid arteries.  Minimal heterogeneous plaque noted in the left ICA.   The right CCA velocity is minimally elevated but no significant plaque noted. Antegrade right vertebral artery flow. Antegrade left vertebral artery flow.  14 day extended Holter monitor: Dominant rhythm normal sinus rhythm.  Heart rate 58-187 bpm.  Avg HR 83 bpm. No atrial fibrillation, ventricular tachycardia, high grade AV block, pauses (3 seconds or longer). Total ventricular ectopic burden <1%. Total supraventricular ectopic burden <1%. Patient triggered events: 4.  Predominately normal sinus rhythm with one episode of SVT and occasional ventricular ectopy.   LABORATORY DATA:    Latest Ref Rng & Units 09/25/2021   12:43 PM 12/18/2020    8:03 AM 12/18/2020    8:02 AM  CBC  WBC 4.0 - 10.5 K/uL 7.5     Hemoglobin 12.0 - 15.0 g/dL 15.0  11.6  11.9   Hematocrit 36.0  - 46.0 % 43.1  34.0  35.0   Platelets 150 - 400 K/uL 130          Latest Ref Rng & Units 05/21/2022   12:30 PM 04/29/2022   11:48 AM 10/30/2021    2:21 PM  CMP  Glucose 70 - 99 mg/dL 108  119  109   BUN 8 - 27 mg/dL 32  27  21   Creatinine 0.57 - 1.00 mg/dL 1.78  1.54  1.06   Sodium 134 - 144 mmol/L 139  137  137   Potassium 3.5 - 5.2  mmol/L 4.8  4.7  4.7   Chloride 96 - 106 mmol/L 103  104  102   CO2 20 - 29 mmol/L _0 Calcium 8.7 - 10.3 mg/dL 10.2  9.8  10.2     Lipid Panel     Component Value Date/Time   CHOL 166 12/09/2020 1035   TRIG 125 12/09/2020 1035   HDL 47 12/09/2020 1035   LDLCALC 97 12/09/2020 1035   LDLDIRECT 104 (H) 12/09/2020 1035   LABVLDL 22 12/09/2020 1035    No results found for: "HGBA1C" No components found for: "NTPROBNP" No results found for: "TSH"  Cardiac Panel (last 3 results) No results for input(s): "CKTOTAL", "CKMB", "TROPONINIHS", "RELINDX" in the last 72 hours.   External Labs: Collected: 04/09/2021 provided by PCP Creatinine 1.13 mg/dL. Sodium 132, potassium 3.2, chloride 94, bicarb 25 AST 32, ALT 27, alkaline phosphatase 54 Hemoglobin A1c 9.5 Total cholesterol 121, triglycerides 150, HDL 53, 8 LDL 43, non-HDL 68  External Labs: Collected: 06/22/2022 Hemoglobin 14.7 g/dL, hematocrit 42.9%. Platelets 137. BUN 28, creatinine 1.4. eGFR 38. Sodium 136, potassium 4.5, chloride 103, bicarb 16 AST 29, ALT 19, alkaline phosphatase 63. Hemoglobin A1c 5.7. Magnesium 1.9   IMPRESSION:    ICD-10-CM   1. Chronic heart failure with preserved ejection fraction (HFpEF) (HCC)  I50.32 EKG 12-Lead    losartan (COZAAR) 25 MG tablet    Basic metabolic panel    Magnesium    Pro b natriuretic peptide (BNP)    2. Dyspnea on exertion  R06.09     3. PVC's (premature ventricular contractions)  I49.3     4. Nonrheumatic aortic valve stenosis  I35.0 PCV ECHOCARDIOGRAM COMPLETE    5. Non-insulin dependent type 2 diabetes mellitus ([place])   E11.9     6. Benign hypertension  I10     7. Chronic obstructive pulmonary disease, unspecified COPD type ([place])  J44.9     8. Former smoker  Z87.891        RECOMMENDATIONS: JONNIE KUBLY is a 81 y.o. female whose past medical history and cardiovascular risk factors include: hypertension, hyperlipidemia, non-insulin-dependent diabetes mellitus type 2, HFpEF/stage B/ NYHA II, COPD, OA, depression, history of breast cancer s/p lumpectomy in 2005 and radiation and chemotherapy in 6333, non-alcoholic cirrhosis of liver in 2015, and former tobacco use.  Chronic heart failure with preserved ejection fraction (HFpEF) (HCC) Chronic and stable. Stage B, NYHA class II Currently euvolemic on physical examination. Was diagnosed with HF prior to establishing care with myself in August 2021 Up titration of GDMT has been difficult given her age, renal function, history of urinary tract infections, and urinary urgency. We will restart losartan 25 mg p.o. daily. Labs in 1 week to reevaluate kidney function and electrolytes Jardiance discontinued due to yeast infections x2. Outside labs independently reviewed.  Dyspnea on exertion Multifactorial.   Chronic and stable. Chronic HFpEF management as discussed above. We will defer management of her COPD to pulmonary medicine. Noted to have borderline PH likely due to [facility] class III.  Patient was in the process of obtaining nocturnal oxygen supplementation.  Still pending. We will repeat echocardiogram prior to next office visit to reevaluate aortic stenosis.  Palpitations/PVC's (premature ventricular contractions) Currently asymptomatic. In the recent past diltiazem discontinued due to sinus bradycardia with A-V dissociation and ventricular escape beats EKG today shows sinus rhythm. Monitor for now.  Aortic stenosis Mild to moderate in severity based on recent echocardiogram.   Repeat echocardiogram prior  to the next office visit. Denies anginal  discomfort, heart failure symptoms, syncope  FINAL MEDICATION LIST END OF ENCOUNTER: Meds ordered this encounter  Medications   losartan (COZAAR) 25 MG tablet    Sig: Take 1 tablet (25 mg total) by mouth daily.    Dispense:  90 tablet    Refill:  0     Current Outpatient Medications:    acetaminophen (TYLENOL) 500 MG tablet, Take 500 mg by mouth every 6 (six) hours as needed., Disp: , Rfl:    albuterol (VENTOLIN HFA) 108 (90 Base) MCG/ACT inhaler, Inhale 2 puffs into the lungs every 4 (four) hours as needed for wheezing or shortness of breath., Disp: 6.7 g, Rfl: 5   Calcium Polycarbophil (FIBER) 625 MG TABS, 2 tablets as needed Orally Three times a day, Disp: , Rfl:    Cholecalciferol (D-3-5) 125 MCG (5000 UT) capsule, Take 10,000 Units by mouth daily., Disp: , Rfl:    CINNAMON PO, Take by mouth., Disp: , Rfl:    cyclobenzaprine (FLEXERIL) 5 MG tablet, Take 1 tablet (5 mg total) by mouth 3 (three) times daily as needed for muscle spasms., Disp: 30 tablet, Rfl: 0   dimenhyDRINATE (DRAMAMINE) 50 MG tablet, Take 50 mg by mouth every 8 (eight) hours as needed for nausea., Disp: , Rfl:    Docusate Sodium (DSS) 100 MG CAPS, 1 capsule as needed Orally Once a day for 30 day(s), Disp: , Rfl:    magnesium oxide (MAG-OX) 400 MG tablet, Take 400 mg by mouth daily., Disp: , Rfl:    melatonin 3 MG TABS tablet, Take 6 mg by mouth at bedtime., Disp: , Rfl:    metFORMIN (GLUCOPHAGE) 500 MG tablet, Take 1 tablet (500 mg total) by mouth 2 (two) times daily with a meal. (Patient taking differently: Take 2 tablets by mouth 2 (two) times daily with a meal.), Disp: 180 tablet, Rfl: 1   Misc Natural Products (APPLE CIDER VINEGAR DIET PO), Take by mouth., Disp: , Rfl:    Multiple Vitamins-Minerals (ONE-A-DAY WOMENS 50+ ADVANTAGE PO), Take 1 tablet by mouth daily., Disp: , Rfl:    rosuvastatin (CRESTOR) 10 MG tablet, TAKE 1 TABLET BY MOUTH EVERYDAY AT BEDTIME, Disp: 90 tablet, Rfl: 1   solifenacin (VESICARE) 10 MG  tablet, Take 10 mg by mouth daily., Disp: , Rfl:    spironolactone (ALDACTONE) 25 MG tablet, TAKE ONE TABLET BY MOUTH EVERY MORNING, Disp: 90 tablet, Rfl: 1   traZODone (DESYREL) 50 MG tablet, Take 1 tablet by mouth at bedtime as needed., Disp: , Rfl:    losartan (COZAAR) 25 MG tablet, Take 1 tablet (25 mg total) by mouth daily., Disp: 90 tablet, Rfl: 0  Orders Placed This Encounter  Procedures   Basic metabolic panel   Magnesium   Pro b natriuretic peptide (BNP)   EKG 12-Lead   PCV ECHOCARDIOGRAM COMPLETE   --Continue cardiac medications as reconciled in final medication list. --Return in about 6 months (around 01/04/2023) for Follow up HFpEF. Or sooner if needed. --Continue follow-up with your primary care physician regarding the management of your other chronic comorbid conditions.  Patient's questions and concerns were addressed to her satisfaction. She voices understanding of the instructions provided during this encounter.   This note was created using a voice recognition software as a result there may be grammatical errors inadvertently enclosed that do not reflect the nature of this encounter. Every attempt is made to correct such errors.  Rex Kras, [place], [facility]  Pager: [phone]  Office: [phone]

## 2022-07-09 ENCOUNTER — Other Ambulatory Visit: Payer: Self-pay

## 2022-07-09 DIAGNOSIS — I5032 Chronic diastolic (congestive) heart failure: Secondary | ICD-10-CM

## 2022-07-09 DIAGNOSIS — I10 Essential (primary) hypertension: Secondary | ICD-10-CM | POA: Diagnosis not present

## 2022-07-10 LAB — BASIC METABOLIC PANEL
BUN/Creatinine Ratio: 21 (ref 12–28)
BUN: 21 mg/dL (ref 8–27)
CO2: 23 mmol/L (ref 20–29)
Calcium: 10 mg/dL (ref 8.7–10.3)
Chloride: 104 mmol/L (ref 96–106)
Creatinine, Ser: 1.02 mg/dL — ABNORMAL HIGH (ref 0.57–1.00)
Glucose: 135 mg/dL — ABNORMAL HIGH (ref 70–99)
Potassium: 4.2 mmol/L (ref 3.5–5.2)
Sodium: 141 mmol/L (ref 134–144)
eGFR: 56 mL/min/{1.73_m2} — ABNORMAL LOW (ref >59)

## 2022-07-10 LAB — MAGNESIUM: Magnesium: 1.9 mg/dL (ref 1.6–2.3)

## 2022-07-10 LAB — PRO B NATRIURETIC PEPTIDE: NT-Pro BNP: 142 pg/mL (ref 0–738)

## 2022-07-13 ENCOUNTER — Other Ambulatory Visit: Payer: Self-pay | Admitting: Cardiology

## 2022-07-13 DIAGNOSIS — R002 Palpitations: Secondary | ICD-10-CM

## 2022-07-13 DIAGNOSIS — I493 Ventricular premature depolarization: Secondary | ICD-10-CM

## 2022-07-13 DIAGNOSIS — R0609 Other forms of dyspnea: Secondary | ICD-10-CM

## 2022-07-13 NOTE — Progress Notes (Signed)
Called patient to inform her about her labs patient understood.

## 2022-07-20 DIAGNOSIS — E1169 Type 2 diabetes mellitus with other specified complication: Secondary | ICD-10-CM | POA: Diagnosis not present

## 2022-08-17 DIAGNOSIS — E1142 Type 2 diabetes mellitus with diabetic polyneuropathy: Secondary | ICD-10-CM | POA: Diagnosis not present

## 2022-08-17 DIAGNOSIS — Z0289 Encounter for other administrative examinations: Secondary | ICD-10-CM | POA: Diagnosis not present

## 2022-08-17 DIAGNOSIS — E1122 Type 2 diabetes mellitus with diabetic chronic kidney disease: Secondary | ICD-10-CM | POA: Diagnosis not present

## 2022-09-29 ENCOUNTER — Other Ambulatory Visit: Payer: Self-pay | Admitting: Hematology and Oncology

## 2022-09-29 ENCOUNTER — Inpatient Hospital Stay: Payer: Medicare HMO | Attending: Nurse Practitioner

## 2022-09-29 ENCOUNTER — Other Ambulatory Visit: Payer: Self-pay

## 2022-09-29 ENCOUNTER — Inpatient Hospital Stay: Payer: Medicare HMO | Admitting: Hematology and Oncology

## 2022-09-29 VITALS — BP 122/62 | HR 91 | Temp 98.2°F | Resp 16 | Wt 190.9 lb

## 2022-09-29 DIAGNOSIS — Z79899 Other long term (current) drug therapy: Secondary | ICD-10-CM | POA: Insufficient documentation

## 2022-09-29 DIAGNOSIS — Z853 Personal history of malignant neoplasm of breast: Secondary | ICD-10-CM | POA: Insufficient documentation

## 2022-09-29 DIAGNOSIS — D696 Thrombocytopenia, unspecified: Secondary | ICD-10-CM | POA: Diagnosis not present

## 2022-09-29 DIAGNOSIS — Z923 Personal history of irradiation: Secondary | ICD-10-CM | POA: Insufficient documentation

## 2022-09-29 LAB — CMP (CANCER CENTER ONLY)
ALT: 16 U/L (ref 0–44)
AST: 32 U/L (ref 15–41)
Albumin: 4.3 g/dL (ref 3.5–5.0)
Alkaline Phosphatase: 48 U/L (ref 38–126)
Anion gap: 9 (ref 5–15)
BUN: 21 mg/dL (ref 8–23)
CO2: 23 mmol/L (ref 22–32)
Calcium: 9.8 mg/dL (ref 8.9–10.3)
Chloride: 107 mmol/L (ref 98–111)
Creatinine: 0.94 mg/dL (ref 0.44–1.00)
GFR, Estimated: >60 mL/min (ref >60)
Glucose, Bld: 97 mg/dL (ref 70–99)
Potassium: 4.1 mmol/L (ref 3.5–5.1)
Sodium: 139 mmol/L (ref 135–145)
Total Bilirubin: 1.6 mg/dL — ABNORMAL HIGH (ref 0.3–1.2)
Total Protein: 7.5 g/dL (ref 6.5–8.1)

## 2022-09-29 LAB — CBC WITH DIFFERENTIAL (CANCER CENTER ONLY)
Abs Immature Granulocytes: 0.01 10*3/uL (ref 0.00–0.07)
Basophils Absolute: 0.1 10*3/uL (ref 0.0–0.1)
Basophils Relative: 1 %
Eosinophils Absolute: 0.2 10*3/uL (ref 0.0–0.5)
Eosinophils Relative: 3 %
HCT: 40 % (ref 36.0–46.0)
Hemoglobin: 14.5 g/dL (ref 12.0–15.0)
Immature Granulocytes: 0 %
Lymphocytes Relative: 32 %
Lymphs Abs: 2.1 10*3/uL (ref 0.7–4.0)
MCH: 33.5 pg (ref 26.0–34.0)
MCHC: 36.3 g/dL — ABNORMAL HIGH (ref 30.0–36.0)
MCV: 92.4 fL (ref 80.0–100.0)
Monocytes Absolute: 0.6 10*3/uL (ref 0.1–1.0)
Monocytes Relative: 10 %
Neutro Abs: 3.5 10*3/uL (ref 1.7–7.7)
Neutrophils Relative %: 54 %
Platelet Count: 118 10*3/uL — ABNORMAL LOW (ref 150–400)
RBC: 4.33 MIL/uL (ref 3.87–5.11)
RDW: 13 % (ref 11.5–15.5)
WBC Count: 6.4 10*3/uL (ref 4.0–10.5)
nRBC: 0 % (ref 0.0–0.2)

## 2022-09-29 NOTE — Progress Notes (Signed)
[place] [phone]   [phone]  PROGRESS NOTE  Patient Care Team: Antony Blackbird, MD as PCP - General (Family Medicine) Rex Kras, DO as Consulting Physician (Cardiology)  Hematological/Oncological History #Thrombocyopenia 1) 05/15/2007: WBC 7.5, Hgb 13.4, Plt 211. MCV 95.1.  2) 09/05/2019: WBC 7.2, Hgb 15.5, Plt 141 (nml 150-400). MCV 93.7.  3) 09/26/19: Establish care with Dr. Lorenso Courier    #Breast Cancer 1)  Left partial mastectomy with needle localization and specimen mammogram, left axillary lymph node dissection/radiation 2005 2) chemotherapy administered 2006 (regimen unclear)  Interval History:  [name] [name] 82 y.o. female with medical history significant for thrombocytopenia and remote breast cancer who presents for a follow up visit. The patient's last visit was on 09/26/2019. In the interim since the last visit she has had no major changes in her health.  On exam today [name] [name] reports that she has been well in the last year.  She has been having some trouble controlling her blood sugars but has otherwise been quite well.  She notes that she is trying to lose weight but has been having trouble with this as well.  She notes that she has not been having any issues with bleeding, bruising, or dark stools.  She notes her energy levels are quite good and currently ranks them as about a 9 out of 10.  She notes she is trying to exercise by "getting up and moving around and riding a stationary bicycle".  She notes that she has been eating well.  She underwent her last mammogram in October 2023 and it was recommended she have a yearly follow-up for that.  She notes that she continues to follow with her cardiologist for heart failure but her last echocardiogram was good.  Overall she is at her baseline level of health.  She denies any fevers, chills, sweats, nausea, vomiting or diarrhea.  A full 10 point ROS is listed below.  MEDICAL HISTORY:  Past  Medical History:  Diagnosis Date   Aortic stenosis    Breast cancer (HCC)    Cancer (HCC)    Chest pain    Chronic kidney disease    Cirrhosis (HCC)    COPD (chronic obstructive pulmonary disease) (HCC)    DJD (degenerative joint disease)    GERD (gastroesophageal reflux disease)    Hyperlipidemia    Hypertension    Leg edema    Orthopnea    Palpitation    Personal history of radiation therapy    SOB (shortness of breath)    Vitamin D deficiency     SURGICAL HISTORY: Past Surgical History:  Procedure Laterality Date   BREAST BIOPSY     BREAST LUMPECTOMY Left    CARDIAC CATHETERIZATION     CORONARY ANGIOPLASTY     LAPAROSCOPIC TOTAL HYSTERECTOMY     RIGHT/LEFT HEART CATH AND CORONARY ANGIOGRAPHY N/A 12/18/2020   Procedure: RIGHT/LEFT HEART CATH AND CORONARY ANGIOGRAPHY;  Surgeon: Nigel Mormon, MD;  Location: [facility];  Service: Cardiovascular;  Laterality: N/A;    SOCIAL HISTORY: Social History   Socioeconomic History   Marital status: Widowed    Spouse name: Not on file   Number of children: 4   Years of education: Not on file   Highest education level: Not on file  Occupational History   Not on file  Tobacco Use   Smoking status: Former    Packs/day: 0.50    Years: 30.00    Total pack years: 15.00  Types: Cigarettes    Start date: 02/12/1961    Quit date: 2005    Years since quitting: 19.0   Smokeless tobacco: Never  Vaping Use   Vaping Use: Never used  Substance and Sexual Activity   Alcohol use: Never   Drug use: Never   Sexual activity: Not on file  Other Topics Concern   Not on file  Social History Narrative   Not on file   Social Determinants of Health   Financial Resource Strain: Not on file  Food Insecurity: Not on file  Transportation Needs: Not on file  Physical Activity: Not on file  Stress: Not on file  Social Connections: Not on file  Intimate Partner Violence: Not on file    FAMILY HISTORY: Family History   Problem Relation Age of Onset   Heart attack Mother    Stroke Mother    Cardiomyopathy Father     ALLERGIES:  is allergic to empagliflozin.  MEDICATIONS:  Current Outpatient Medications  Medication Sig Dispense Refill   acetaminophen (TYLENOL) 500 MG tablet Take 500 mg by mouth every 6 (six) hours as needed.     albuterol (VENTOLIN HFA) 108 (90 Base) MCG/ACT inhaler Inhale 2 puffs into the lungs every 4 (four) hours as needed for wheezing or shortness of breath. 6.7 g 5   Calcium Polycarbophil (FIBER) 625 MG TABS 2 tablets as needed Orally Three times a day     Cholecalciferol (D-3-5) 125 MCG (5000 UT) capsule Take 10,000 Units by mouth daily.     CINNAMON PO Take by mouth.     cyclobenzaprine (FLEXERIL) 5 MG tablet Take 1 tablet (5 mg total) by mouth 3 (three) times daily as needed for muscle spasms. 30 tablet 0   dimenhyDRINATE (DRAMAMINE) 50 MG tablet Take 50 mg by mouth every 8 (eight) hours as needed for nausea.     Docusate Sodium (DSS) 100 MG CAPS 1 capsule as needed Orally Once a day for 30 day(s)     losartan (COZAAR) 25 MG tablet Take 1 tablet (25 mg total) by mouth daily. 90 tablet 0   magnesium oxide (MAG-OX) 400 MG tablet Take 400 mg by mouth daily.     melatonin 3 MG TABS tablet Take 6 mg by mouth at bedtime.     metFORMIN (GLUCOPHAGE) 500 MG tablet Take 1 tablet (500 mg total) by mouth 2 (two) times daily with a meal. (Patient taking differently: Take 2 tablets by mouth 2 (two) times daily with a meal.) 180 tablet 1   Misc Natural Products (APPLE CIDER VINEGAR DIET PO) Take by mouth.     Multiple Vitamins-Minerals (ONE-A-DAY WOMENS 50+ ADVANTAGE PO) Take 1 tablet by mouth daily.     rosuvastatin (CRESTOR) 10 MG tablet TAKE 1 TABLET BY MOUTH EVERYDAY AT BEDTIME 90 tablet 1   solifenacin (VESICARE) 10 MG tablet Take 10 mg by mouth daily.     spironolactone (ALDACTONE) 25 MG tablet TAKE ONE TABLET BY MOUTH EVERY MORNING 90 tablet 1   traZODone (DESYREL) 50 MG tablet Take 1  tablet by mouth at bedtime as needed.     No current facility-administered medications for this visit.    REVIEW OF SYSTEMS:   Constitutional: ( - ) fevers, ( - )  chills , ( - ) night sweats Eyes: ( - ) blurriness of vision, ( - ) double vision, ( - ) watery eyes Ears, nose, mouth, throat, and face: ( - ) mucositis, ( - ) sore throat Respiratory: ( - )  cough, ( - ) dyspnea, ( - ) wheezes Cardiovascular: ( - ) palpitation, ( - ) chest discomfort, ( - ) lower extremity swelling Gastrointestinal:  ( - ) nausea, ( - ) heartburn, ( - ) change in bowel habits Skin: ( - ) abnormal skin rashes Lymphatics: ( - ) new lymphadenopathy, ( - ) easy bruising Neurological: ( - ) numbness, ( - ) tingling, ( - ) new weaknesses Behavioral/Psych: ( - ) mood change, ( - ) new changes  All other systems were reviewed with the patient and are negative.  PHYSICAL EXAMINATION: Vitals:   09/29/22 1455  BP: 122/62  Pulse: 91  Resp: 16  Temp: 98.2 F (36.8 C)  SpO2: 94%   Filed Weights   09/29/22 1455  Weight: 190 lb 14.4 oz (86.6 kg)    GENERAL: Well-appearing elderly Caucasian female, alert, no distress and comfortable SKIN: skin color, texture, turgor are normal, no rashes or significant lesions EYES: conjunctiva are pink and non-injected, sclera clear LUNGS: clear to auscultation and percussion with normal breathing effort HEART: regular rate & rhythm and no murmurs and no lower extremity edema Musculoskeletal: no cyanosis of digits and no clubbing  PSYCH: alert & oriented x 3, fluent speech NEURO: no focal motor/sensory deficits  LABORATORY DATA:  I have reviewed the data as listed    Latest Ref Rng & Units 09/29/2022    2:37 PM 09/25/2021   12:43 PM 12/18/2020    8:03 AM  CBC  WBC 4.0 - 10.5 K/uL 6.4  7.5    Hemoglobin 12.0 - 15.0 g/dL 14.5  15.0  11.6   Hematocrit 36.0 - 46.0 % 40.0  43.1  34.0   Platelets 150 - 400 K/uL 118  130         Latest Ref Rng & Units 09/29/2022    2:37 PM  07/09/2022   10:29 AM 05/21/2022   12:30 PM  CMP  Glucose 70 - 99 mg/dL 97  135  108   BUN 8 - 23 mg/dL 21  21  32   Creatinine 0.44 - 1.00 mg/dL 0.94  1.02  1.78   Sodium 135 - 145 mmol/L 139  141  139   Potassium 3.5 - 5.1 mmol/L 4.1  4.2  4.8   Chloride 98 - 111 mmol/L 107  104  103   CO2 22 - 32 mmol/L '23  23  21   '$ Calcium 8.9 - 10.3 mg/dL 9.8  10.0  10.2   Total Protein 6.5 - 8.1 g/dL 7.5     Total Bilirubin 0.3 - 1.2 mg/dL 1.6     Alkaline Phos 38 - 126 U/L 48     AST 15 - 41 U/L 32     ALT 0 - 44 U/L 16       No results found for: "MPROTEIN" Lab Results  Component Value Date   KPAFRELGTCHN 29.1 (H) 09/26/2019   LAMBDASER 15.3 09/26/2019   KAPLAMBRATIO 1.90 (H) 09/26/2019    RADIOGRAPHIC STUDIES: No results found.  ASSESSMENT & PLAN [name] [name] 82 y.o. female with medical history significant for thrombocytopenia and remote breast cancer who presents for a follow up visit.  #Thrombocytopenia, mild--stable --today will recheck baseline CMP,CBC, and  Vitamin B12 --no clear indication for splenic imaging or bone marrow biopsy as the thrombocytopenia is very mild at this time.  --Platelets today at 118, stable from April last year at 127.  Overall platelet count remains mildly depressed. --f/u in 12 months  to repeat CBC and monitor her platelets    #ER/PR Positive HER2 negative Breast Cancer, in remission -- Mammogram scheduled next month per patient. --Bone density test scheduled next month as well. --Patient is currently in survivorship given the remote nature of her breast cancer.    No orders of the defined types were placed in this encounter.   All questions were answered. The patient knows to call the clinic with any problems, questions or concerns.  A total of more than 25 minutes were spent on this encounter with face-to-face time and non-face-to-face time, including preparing to see the patient, ordering tests and/or medications, counseling the patient  and coordination of care as outlined above.   Ledell Peoples, MD Department of Hematology/Oncology [place] [facility] Phone: [phone] Pager: [phone] Email: [email or web address]  09/29/2022 3:48 PM

## 2022-12-06 ENCOUNTER — Other Ambulatory Visit: Payer: Self-pay | Admitting: Cardiology

## 2022-12-06 DIAGNOSIS — I5032 Chronic diastolic (congestive) heart failure: Secondary | ICD-10-CM

## 2022-12-06 DIAGNOSIS — R0609 Other forms of dyspnea: Secondary | ICD-10-CM

## 2022-12-28 ENCOUNTER — Other Ambulatory Visit: Payer: Medicare PPO

## 2023-01-04 ENCOUNTER — Ambulatory Visit: Payer: Medicare PPO | Admitting: Cardiology

## 2023-01-14 ENCOUNTER — Ambulatory Visit: Payer: Medicare PPO | Admitting: Cardiology

## 2023-01-25 ENCOUNTER — Ambulatory Visit: Payer: Medicare HMO

## 2023-01-25 DIAGNOSIS — I350 Nonrheumatic aortic (valve) stenosis: Secondary | ICD-10-CM

## 2023-02-11 ENCOUNTER — Ambulatory Visit: Payer: Medicare HMO | Admitting: Cardiology

## 2023-02-14 ENCOUNTER — Other Ambulatory Visit: Payer: Self-pay | Admitting: Cardiology

## 2023-02-14 DIAGNOSIS — I5032 Chronic diastolic (congestive) heart failure: Secondary | ICD-10-CM

## 2023-02-17 LAB — BASIC METABOLIC PANEL
BUN/Creatinine Ratio: 17 (ref 12–28)
BUN: 17 mg/dL (ref 8–27)
CO2: 22 mmol/L (ref 20–29)
Calcium: 10.2 mg/dL (ref 8.7–10.3)
Chloride: 103 mmol/L (ref 96–106)
Creatinine, Ser: 1.02 mg/dL — ABNORMAL HIGH (ref 0.57–1.00)
Glucose: 176 mg/dL — ABNORMAL HIGH (ref 70–99)
Potassium: 3.8 mmol/L (ref 3.5–5.2)
Sodium: 140 mmol/L (ref 134–144)
eGFR: 56 mL/min/{1.73_m2} — ABNORMAL LOW (ref >59)

## 2023-02-17 LAB — MAGNESIUM: Magnesium: 1.8 mg/dL (ref 1.6–2.3)

## 2023-02-17 LAB — PRO B NATRIURETIC PEPTIDE: NT-Pro BNP: 84 pg/mL (ref 0–738)

## 2023-02-28 ENCOUNTER — Ambulatory Visit: Payer: Medicare HMO | Admitting: Cardiology

## 2023-02-28 ENCOUNTER — Encounter: Payer: Self-pay | Admitting: Cardiology

## 2023-02-28 VITALS — BP 129/81 | HR 82 | Resp 16 | Ht 62.0 in | Wt 184.2 lb

## 2023-02-28 DIAGNOSIS — R0609 Other forms of dyspnea: Secondary | ICD-10-CM

## 2023-02-28 DIAGNOSIS — I5032 Chronic diastolic (congestive) heart failure: Secondary | ICD-10-CM

## 2023-02-28 DIAGNOSIS — Z87891 Personal history of nicotine dependence: Secondary | ICD-10-CM

## 2023-02-28 DIAGNOSIS — E119 Type 2 diabetes mellitus without complications: Secondary | ICD-10-CM

## 2023-02-28 DIAGNOSIS — J449 Chronic obstructive pulmonary disease, unspecified: Secondary | ICD-10-CM

## 2023-02-28 DIAGNOSIS — I10 Essential (primary) hypertension: Secondary | ICD-10-CM

## 2023-02-28 DIAGNOSIS — I350 Nonrheumatic aortic (valve) stenosis: Secondary | ICD-10-CM

## 2023-02-28 NOTE — Progress Notes (Signed)
[name] [name] Date of Birth: [birth year] MRN: [id number] Primary Care Provider:Merino Festus Aloe, MD Former Cardiology Providers: Altamese [place], APRN, FNP-C Primary Cardiologist: Tessa Lerner, DO, [facility] (established care 05/06/2020)  Date: 02/28/23 Last Office Visit: 07/05/2022  Chief Complaint  Patient presents with   Chronic heart failure with preserved ejection fraction (HFp   Follow-up   HPI  [name] [name] is a 82 y.o.  female whose past medical history and cardiovascular risk factors include: aortic stenosis, hypertension, hyperlipidemia, HFpEF/stage B/ NYHA II, COPD, OA, depression, history of breast cancer s/p lumpectomy in 2005 and radiation and chemotherapy in 2006, non-alcoholic cirrhosis of liver in 2015, and former tobacco use.  Patient is being followed by the practice for chronic HFpEF, aortic stenosis, palpitations/PVCs.  In the past, GDMT has been difficult to uptitrate due to advanced age, renal function, history of urinary tract infection/urgency.  In addition, she has remained asymptomatic with regards to her aortic stenosis which has been monitored.  Given her history of PVCs and palpitations she was on diltiazem but eventually developed sinus bradycardia with A-V dissociation and ventricular escape beats.  And since then diltiazem has been discontinued.  She presents today for 68-month follow-up visit.  She denies anginal chest pain or heart failure symptoms.  She is currently taking care of her granddaughter for the summer and enjoys it.  She is riding her stationary bike 10 minutes 3 times a day and walking 30 minutes twice a day.  She is tolerating her medications well.  No hospitalizations or urgent care visits for cardiovascular reasons.  Her chronic dyspnea is very well-controlled on current medical therapy.  Most recent labs 12 days ago independently reviewed with her.  Most recent echocardiogram results reviewed.  ALLERGIES: Allergies  Allergen Reactions    Empagliflozin Other (See Comments)   MEDICATION LIST PRIOR TO VISIT: Current Outpatient Medications on File Prior to Visit  Medication Sig Dispense Refill   acetaminophen (TYLENOL) 500 MG tablet Take 500 mg by mouth every 6 (six) hours as needed.     albuterol (VENTOLIN HFA) 108 (90 Base) MCG/ACT inhaler Inhale 2 puffs into the lungs every 4 (four) hours as needed for wheezing or shortness of breath. 6.7 g 5   Calcium Polycarbophil (FIBER) 625 MG TABS 2 tablets as needed Orally Three times a day     Cholecalciferol (D-3-5) 125 MCG (5000 UT) capsule Take 10,000 Units by mouth daily.     CINNAMON PO Take by mouth.     cyclobenzaprine (FLEXERIL) 5 MG tablet Take 1 tablet (5 mg total) by mouth 3 (three) times daily as needed for muscle spasms. 30 tablet 0   dimenhyDRINATE (DRAMAMINE) 50 MG tablet Take 50 mg by mouth every 8 (eight) hours as needed for nausea.     Docusate Sodium (DSS) 100 MG CAPS 1 capsule as needed Orally Once a day for 30 day(s)     losartan (COZAAR) 25 MG tablet Take 1 tablet (25 mg total) by mouth daily. 90 tablet 0   magnesium oxide (MAG-OX) 400 MG tablet Take 400 mg by mouth daily.     melatonin 3 MG TABS tablet Take 6 mg by mouth at bedtime.     metFORMIN (GLUCOPHAGE) 500 MG tablet Take 1 tablet (500 mg total) by mouth 2 (two) times daily with a meal. (Patient taking differently: Take 2 tablets by mouth 2 (two) times daily with a meal.) 180 tablet 1   Misc Natural Products (APPLE CIDER VINEGAR DIET PO) Take by  mouth.     Multiple Vitamins-Minerals (ONE-A-DAY WOMENS 50+ ADVANTAGE PO) Take 1 tablet by mouth daily.     rosuvastatin (CRESTOR) 10 MG tablet TAKE 1 TABLET BY MOUTH EVERYDAY AT BEDTIME 90 tablet 1   solifenacin (VESICARE) 10 MG tablet Take 10 mg by mouth daily.     spironolactone (ALDACTONE) 25 MG tablet TAKE ONE TABLET BY MOUTH EVERY MORNING 90 tablet 1   traZODone (DESYREL) 50 MG tablet Take 1 tablet by mouth at bedtime as needed.     No current  facility-administered medications on file prior to visit.    PAST MEDICAL HISTORY: Past Medical History:  Diagnosis Date   Aortic stenosis    Breast cancer (HCC)    Cancer (HCC)    Chest pain    Chronic kidney disease    Cirrhosis (HCC)    COPD (chronic obstructive pulmonary disease) (HCC)    DJD (degenerative joint disease)    GERD (gastroesophageal reflux disease)    Hyperlipidemia    Hypertension    Leg edema    Orthopnea    Palpitation    Personal history of radiation therapy    SOB (shortness of breath)    Vitamin D deficiency     PAST SURGICAL HISTORY: Past Surgical History:  Procedure Laterality Date   BREAST BIOPSY     BREAST LUMPECTOMY Left    CARDIAC CATHETERIZATION     CORONARY ANGIOPLASTY     LAPAROSCOPIC TOTAL HYSTERECTOMY     RIGHT/LEFT HEART CATH AND CORONARY ANGIOGRAPHY N/A 12/18/2020   Procedure: RIGHT/LEFT HEART CATH AND CORONARY ANGIOGRAPHY;  Surgeon: Elder Negus, MD;  Location: [facility] INVASIVE CV [facility];  Service: Cardiovascular;  Laterality: N/A;    FAMILY HISTORY: The patient's family history includes Cardiomyopathy in her father; Heart attack in her mother; Stroke in her mother.   SOCIAL HISTORY:  The patient  reports that she quit smoking about 19 years ago. Her smoking use included cigarettes. She started smoking about 62 years ago. She has a 15.00 pack-year smoking history. She has never used smokeless tobacco. She reports that she does not drink alcohol and does not use drugs.  Review of Systems  Constitutional: Negative for chills, fever and weight loss.  HENT:  Negative for hoarse voice and nosebleeds.   Eyes:  Negative for discharge, double vision and pain.  Cardiovascular:  Positive for dyspnea on exertion (chronic and stable). Negative for chest pain, claudication, leg swelling, near-syncope, orthopnea, palpitations, paroxysmal nocturnal dyspnea and syncope.  Respiratory:  Positive for snoring. Negative for hemoptysis and shortness of  breath.   Musculoskeletal:  Positive for back pain and joint pain. Negative for muscle cramps and myalgias.  Gastrointestinal:  Negative for abdominal pain, constipation, diarrhea, hematemesis, hematochezia, melena, nausea and vomiting.  Neurological:  Negative for dizziness and light-headedness.    PHYSICAL EXAM:    02/28/2023   10:40 AM 09/29/2022    2:55 PM 07/05/2022   12:36 PM  Vitals with BMI  Height 5\' 2"   5\' 2"   Weight 184 lbs 3 oz 190 lbs 14 oz 189 lbs  BMI 33.68  34.56  Systolic 129 122 161  Diastolic 81 62 69  Pulse 82 91 80   Physical Exam  Constitutional: No distress.  Age appropriate, hemodynamically stable.   Neck: No JVD present.  Cardiovascular: Normal rate, regular rhythm, S1 normal, S2 normal, intact distal pulses and normal pulses. Exam reveals no gallop, no S3 and no S4.  Murmur heard. Harsh midsystolic murmur is present  with a grade of 3/6 at the upper right sternal border. Pulmonary/Chest: Effort normal and breath sounds normal. No stridor. She has no wheezes. She has no rales.  Abdominal: Soft. Bowel sounds are normal. She exhibits no distension. There is no abdominal tenderness.  Musculoskeletal:        General: No edema.     Cervical back: Neck supple.  Neurological: She is alert and oriented to person, place, and time. She has intact cranial nerves (2-12).  Skin: Skin is warm and moist.   CARDIAC DATABASE: EKG: February 28, 2023: Sinus rhythm, 78 bpm, left axis, IVCD, left anterior fascicular block.  Echocardiogram: 06/08/2021 1. Left ventricular ejection fraction, by estimation, is 50 to 55%. The left ventricle has low normal function. The left ventricle has no regional wall motion abnormalities. There is mild left ventricular hypertrophy. Left ventricular diastolic  parameters were normal. 2. Right ventricular systolic function is normal. The right ventricular size is normal. 3. The mitral valve is normal in structure. Trivial mitral valve  regurgitation. No evidence of mitral stenosis. 4. Native Aortic valve, not well visualized, no regurgitation, mild to moderate AS (peak velocity 2.64m/s, PG , MG11.19mmHg, AVA VTI 1.36cm2, DI 0.43).  01/25/2023:  Normal LV systolic function with visual EF 55-60%. Left ventricle cavity is normal in size. Normal left ventricular wall thickness. Normal global  wall motion. Indeterminate diastolic filling pattern, normal LAP.  Left atrial cavity is slightly dilated.  Trileaflet aortic valve with no regurgitation. Moderate aortic valve leaflet calcification. Moderate aortic stenosis. AV Pk Grad of 18.9 mmHg.  Pk vel 2.18, mean gradient 13.4, AVA 1.2  Trace mitral regurgitation. Mild mitral valve leaflet thickening.  Structurally normal tricuspid valve with trace regurgitation. No evidence of pulmonary hypertension.  Compared to 10/2020, aortic stenosis now moderate.  ~~Personally reviewed the echo report based on hemodynamics severity of aortic stenosis still mild but no more than mild/moderate.   Stress Testing:  Lexiscan Myoview stress test 03/12/2019: Lexiscan stress test was performed. Stress EKG is non-diagnostic, as this is pharmacological stress test. Normal myocardial perfusion, with mild uniform breast tissue attenuation in inferior myocardium. LVEF 53%. Low risk study.   Heart Catheterization: 12/18/2020: Angiographically normal coronary arteries No coronary artery disease Mild aortic stenosis, mean PG 9 mmHg Likely etiology of dyspnea is COPD and borderline PH (mPAP 23 mmHg), likely WHO Grp III  Carotid artery duplex  03/19/2019: No hemodynamically significant stenosis noted in bilateral internal carotid arteries.  Minimal heterogeneous plaque noted in the left ICA.   The right CCA velocity is minimally elevated but no significant plaque noted. Antegrade right vertebral artery flow. Antegrade left vertebral artery flow.  14 day extended Holter monitor: Dominant rhythm normal  sinus rhythm.  Heart rate 58-187 bpm.  Avg HR 83 bpm. No atrial fibrillation, ventricular tachycardia, high grade AV block, pauses (3 seconds or longer). Total ventricular ectopic burden <1%. Total supraventricular ectopic burden <1%. Patient triggered events: 4.  Predominately normal sinus rhythm with one episode of SVT and occasional ventricular ectopy.   LABORATORY DATA:    Latest Ref Rng & Units 09/29/2022    2:37 PM 09/25/2021   12:43 PM 12/18/2020    8:03 AM  CBC  WBC 4.0 - 10.5 K/uL 6.4  7.5    Hemoglobin 12.0 - 15.0 g/dL 40.9  81.1  91.4   Hematocrit 36.0 - 46.0 % 40.0  43.1  34.0   Platelets 150 - 400 K/uL 118  130  Latest Ref Rng & Units 02/16/2023   10:13 AM 09/29/2022    2:37 PM 07/09/2022   10:29 AM  CMP  Glucose 70 - 99 mg/dL 098  97  119   BUN 8 - 27 mg/dL 17  21  21    Creatinine 0.57 - 1.00 mg/dL 1.47  8.29  5.62   Sodium 134 - 144 mmol/L 140  139  141   Potassium 3.5 - 5.2 mmol/L 3.8  4.1  4.2   Chloride 96 - 106 mmol/L 103  107  104   CO2 20 - 29 mmol/L 22  23  23    Calcium 8.7 - 10.3 mg/dL 13.0  9.8  86.5   Total Protein 6.5 - 8.1 g/dL  7.5    Total Bilirubin 0.3 - 1.2 mg/dL  1.6    Alkaline Phos 38 - 126 U/L  48    AST 15 - 41 U/L  32    ALT 0 - 44 U/L  16      Lipid Panel     Component Value Date/Time   CHOL 166 12/09/2020 1035   TRIG 125 12/09/2020 1035   HDL 47 12/09/2020 1035   LDLCALC 97 12/09/2020 1035   LDLDIRECT 104 (H) 12/09/2020 1035   LABVLDL 22 12/09/2020 1035    No results found for: "HGBA1C" No components found for: "NTPROBNP" No results found for: "TSH"  Cardiac Panel (last 3 results) No results for input(s): "CKTOTAL", "CKMB", "TROPONINIHS", "RELINDX" in the last 72 hours.   External Labs: Collected: 04/09/2021 provided by PCP Creatinine 1.13 mg/dL. Sodium 132, potassium 3.2, chloride 94, bicarb 25 AST 32, ALT 27, alkaline phosphatase 54 Hemoglobin A1c 9.5 Total cholesterol 121, triglycerides 150, HDL 53, 8 LDL 43,  non-HDL 68  External Labs: Collected: 06/22/2022 Hemoglobin 14.7 g/dL, hematocrit 78.4%. Platelets 137. BUN 28, creatinine 1.4. eGFR 38. Sodium 136, potassium 4.5, chloride 103, bicarb 16 AST 29, ALT 19, alkaline phosphatase 63. Hemoglobin A1c 5.7. Magnesium 1.9   IMPRESSION:    ICD-10-CM   1. Chronic heart failure with preserved ejection fraction (HFpEF) (HCC)  I50.32 EKG 12-Lead    2. Nonrheumatic aortic valve stenosis  I35.0 ECHOCARDIOGRAM COMPLETE    3. Dyspnea on exertion  R06.09     4. Non-insulin dependent type 2 diabetes mellitus (HCC)  E11.9     5. Benign hypertension  I10     6. Chronic obstructive pulmonary disease, unspecified COPD type (HCC)  J44.9     7. Former smoker  Z87.891        RECOMMENDATIONS: [name] [name] is a 82 y.o. female whose past medical history and cardiovascular risk factors include: aortic stenosis, hypertension, hyperlipidemia, non-insulin-dependent diabetes mellitus type 2, HFpEF/stage B/ NYHA II, COPD, OA, depression, history of breast cancer s/p lumpectomy in 2005 and radiation and chemotherapy in 2006, non-alcoholic cirrhosis of liver in 2015, and former tobacco use.  Chronic heart failure with preserved ejection fraction (HFpEF) (HCC) Chronic and stable. Stage B, NYHA class II Currently euvolemic on physical examination. Was diagnosed with HF prior to establishing care with myself in August 2021 Up titration of GDMT has been difficult given her age, renal function, history of urinary tract infections, and urinary urgency. Currently on losartan, we discussed transition to [facility] but patient would like to hold off. Jardiance in the past discontinued due to yeast infection x 2.  Labs from June 2024 independently reviewed.  Renal function stable, NT-proBNP within normal limits. Continue current medical therapy.  Dyspnea on exertion  Multifactorial.   Chronic and stable. Chronic HFpEF management as discussed above. Aortic  stenosis: Overall severity remains stable. We will defer management of her COPD to pulmonary medicine. Noted to have borderline PH likely due to [facility] class III.  Patient was in the process of obtaining nocturnal oxygen supplementation.  Still pending. Continue to monitor clinically.  Palpitations/PVC's (premature ventricular contractions) Currently asymptomatic. In the recent past diltiazem discontinued due to sinus bradycardia with A-V dissociation and ventricular escape beats Monitor for now.  Aortic stenosis Mild/moderate in severity based on recent echocardiogram.   Overall remains stable compared to prior study. Will repeat an echocardiogram at the hospital in 1 year to reevaluate disease progression.  The interpreting echocardiographer comments on the severity of AS being moderate now.  However, based on the hemodynamics reviewed on the report the severity is mild to moderate at best. Denies syncope, heart failure symptoms, or anginal chest pain  FINAL MEDICATION LIST END OF ENCOUNTER: No orders of the defined types were placed in this encounter.    Current Outpatient Medications:    acetaminophen (TYLENOL) 500 MG tablet, Take 500 mg by mouth every 6 (six) hours as needed., Disp: , Rfl:    albuterol (VENTOLIN HFA) 108 (90 Base) MCG/ACT inhaler, Inhale 2 puffs into the lungs every 4 (four) hours as needed for wheezing or shortness of breath., Disp: 6.7 g, Rfl: 5   Calcium Polycarbophil (FIBER) 625 MG TABS, 2 tablets as needed Orally Three times a day, Disp: , Rfl:    Cholecalciferol (D-3-5) 125 MCG (5000 UT) capsule, Take 10,000 Units by mouth daily., Disp: , Rfl:    CINNAMON PO, Take by mouth., Disp: , Rfl:    cyclobenzaprine (FLEXERIL) 5 MG tablet, Take 1 tablet (5 mg total) by mouth 3 (three) times daily as needed for muscle spasms., Disp: 30 tablet, Rfl: 0   dimenhyDRINATE (DRAMAMINE) 50 MG tablet, Take 50 mg by mouth every 8 (eight) hours as needed for nausea., Disp: , Rfl:     Docusate Sodium (DSS) 100 MG CAPS, 1 capsule as needed Orally Once a day for 30 day(s), Disp: , Rfl:    losartan (COZAAR) 25 MG tablet, Take 1 tablet (25 mg total) by mouth daily., Disp: 90 tablet, Rfl: 0   magnesium oxide (MAG-OX) 400 MG tablet, Take 400 mg by mouth daily., Disp: , Rfl:    melatonin 3 MG TABS tablet, Take 6 mg by mouth at bedtime., Disp: , Rfl:    metFORMIN (GLUCOPHAGE) 500 MG tablet, Take 1 tablet (500 mg total) by mouth 2 (two) times daily with a meal. (Patient taking differently: Take 2 tablets by mouth 2 (two) times daily with a meal.), Disp: 180 tablet, Rfl: 1   Misc Natural Products (APPLE CIDER VINEGAR DIET PO), Take by mouth., Disp: , Rfl:    Multiple Vitamins-Minerals (ONE-A-DAY WOMENS 50+ ADVANTAGE PO), Take 1 tablet by mouth daily., Disp: , Rfl:    rosuvastatin (CRESTOR) 10 MG tablet, TAKE 1 TABLET BY MOUTH EVERYDAY AT BEDTIME, Disp: 90 tablet, Rfl: 1   solifenacin (VESICARE) 10 MG tablet, Take 10 mg by mouth daily., Disp: , Rfl:    spironolactone (ALDACTONE) 25 MG tablet, TAKE ONE TABLET BY MOUTH EVERY MORNING, Disp: 90 tablet, Rfl: 1   traZODone (DESYREL) 50 MG tablet, Take 1 tablet by mouth at bedtime as needed., Disp: , Rfl:   Orders Placed This Encounter  Procedures   EKG 12-Lead   ECHOCARDIOGRAM COMPLETE   --Continue cardiac medications as reconciled in  final medication list. --Return in about 6 months (around 08/30/2023) for Follow up, Dyspnea,HFpEF, aortic stenosis. . Or sooner if needed. --Continue follow-up with your primary care physician regarding the management of your other chronic comorbid conditions.  Patient's questions and concerns were addressed to her satisfaction. She voices understanding of the instructions provided during this encounter.   This note was created using a voice recognition software as a result there may be grammatical errors inadvertently enclosed that do not reflect the nature of this encounter. Every attempt is made to correct  such errors.  Tessa Lerner, [place], [facility]  Pager: [phone] Office: [phone]

## 2023-05-25 NOTE — Telephone Encounter (Signed)
This encounter was created in error - please disregard.

## 2023-07-11 ENCOUNTER — Other Ambulatory Visit: Payer: Self-pay

## 2023-07-11 DIAGNOSIS — I5032 Chronic diastolic (congestive) heart failure: Secondary | ICD-10-CM

## 2023-07-11 DIAGNOSIS — R0609 Other forms of dyspnea: Secondary | ICD-10-CM

## 2023-07-11 MED ORDER — SPIRONOLACTONE 25 MG PO TABS: 25.0000 mg | ORAL_TABLET | Freq: Every morning | ORAL | 2 refills | Status: DC

## 2023-08-23 ENCOUNTER — Ambulatory Visit: Payer: Medicare HMO | Attending: Cardiology | Admitting: Cardiology

## 2023-08-24 ENCOUNTER — Encounter: Payer: Self-pay | Admitting: Cardiology

## 2023-11-03 ENCOUNTER — Ambulatory Visit: Payer: Medicare Other | Admitting: Cardiology

## 2023-11-10 DIAGNOSIS — R69 Illness, unspecified: Secondary | ICD-10-CM | POA: Diagnosis not present

## 2023-11-15 DIAGNOSIS — N3281 Overactive bladder: Secondary | ICD-10-CM | POA: Diagnosis not present

## 2023-11-15 DIAGNOSIS — N3941 Urge incontinence: Secondary | ICD-10-CM | POA: Diagnosis not present

## 2023-11-29 ENCOUNTER — Ambulatory Visit: Payer: Medicare Other | Attending: Cardiology | Admitting: Cardiology

## 2023-11-29 ENCOUNTER — Encounter: Payer: Self-pay | Admitting: Cardiology

## 2023-11-29 VITALS — BP 120/70 | HR 102 | Resp 16 | Ht 62.0 in | Wt 179.0 lb

## 2023-11-29 DIAGNOSIS — I5032 Chronic diastolic (congestive) heart failure: Secondary | ICD-10-CM

## 2023-11-29 DIAGNOSIS — J449 Chronic obstructive pulmonary disease, unspecified: Secondary | ICD-10-CM

## 2023-11-29 DIAGNOSIS — M1612 Unilateral primary osteoarthritis, left hip: Secondary | ICD-10-CM | POA: Diagnosis not present

## 2023-11-29 DIAGNOSIS — I350 Nonrheumatic aortic (valve) stenosis: Secondary | ICD-10-CM

## 2023-11-29 DIAGNOSIS — M25551 Pain in right hip: Secondary | ICD-10-CM | POA: Diagnosis not present

## 2023-11-29 DIAGNOSIS — E119 Type 2 diabetes mellitus without complications: Secondary | ICD-10-CM

## 2023-11-29 DIAGNOSIS — R0609 Other forms of dyspnea: Secondary | ICD-10-CM | POA: Diagnosis not present

## 2023-11-29 DIAGNOSIS — I493 Ventricular premature depolarization: Secondary | ICD-10-CM | POA: Diagnosis not present

## 2023-11-29 DIAGNOSIS — Z87891 Personal history of nicotine dependence: Secondary | ICD-10-CM

## 2023-11-29 DIAGNOSIS — I10 Essential (primary) hypertension: Secondary | ICD-10-CM | POA: Diagnosis not present

## 2023-11-29 MED ORDER — DILTIAZEM HCL ER COATED BEADS 120 MG PO CP24: 120.0000 mg | ORAL_CAPSULE | Freq: Every morning | ORAL | 3 refills | Status: DC

## 2023-11-29 NOTE — Progress Notes (Signed)
 Cardiology Office Note:  .   Date:  11/29/2023  ID:  [name] [name], DOB 18-Mar-1941, MRN [id number] PCP:  [facility]  Former Cardiology Providers: [place], APRN, FNP-C  [facility] Providers Cardiologist:  Tessa Lerner, DO , [facility] (established care 05/06/2020) Electrophysiologist:  None  Click to update primary MD,subspecialty MD or APP then REFRESH:1}    Chief Complaint  Patient presents with   Chronic heart failure with preserved ejection fraction   Follow-up    History of Present Illness: .   [name] [name] is a 83 y.o. Caucasian female whose past medical history and cardiovascular risk factors includes: aortic stenosis, hypertension, hyperlipidemia, HFpEF/stage B/ NYHA II, COPD, OA, depression, history of breast cancer s/p lumpectomy in 2005 and radiation and chemotherapy in 2006, non-alcoholic cirrhosis of liver in 2015, and former tobacco use.   Patient being followed by the practice given her history of chronic HFpEF, aortic stenosis, palpitations/PVCs.  In the past, GDMT has been difficult to uptitrate due to advanced age, renal function, history of urinary tract infection/urgency. In addition, she has remained asymptomatic with regards to her aortic stenosis which has been monitored. Given her history of PVCs and palpitations she was on diltiazem but eventually developed sinus bradycardia with A-V dissociation and ventricular escape beats. And since then diltiazem has been discontinued.   02/28/2023:  Clinically was doing better with good functional capacity for age.  Echocardiogram was ordered to evaluate the progression of aortic stenosis.  11/29/2023: Since last office visit patient states that she is doing well from a cardiovascular standpoint.  Shortness of breath is significantly improved.  No anginal chest pain or heart failure symptoms.  No near-syncope or syncopal events.  Overall functional capacity remains stable.  Review of  Systems: .   Review of Systems  Cardiovascular:  Negative for chest pain, claudication, irregular heartbeat, leg swelling, near-syncope, orthopnea, palpitations, paroxysmal nocturnal dyspnea and syncope.  Respiratory:  Negative for shortness of breath.   Hematologic/Lymphatic: Negative for bleeding problem.    Studies Reviewed:   EKG: EKG Interpretation Date/Time:  Tuesday November 29 2023 13:52:33 EDT Ventricular Rate:  101 PR Interval:  148 QRS Duration:  122 QT Interval:  354 QTC Calculation: 459 R Axis:   -60  Text Interpretation: Sinus tachycardia with occasional Premature ventricular complexes Left axis deviation Minimal voltage criteria for LVH, may be normal variant ( Cornell product ) Consider Anteroseptal infarct , age undetermined When compared with ECG of 20-Nov-2004 11:29, Premature ventricular complexes are now Present Anteroseptal infarct , age undetermined , new Since last tracing Confirmed by Tessa Lerner [phone]) on 11/29/2023 2:00:37 PM  Echocardiogram: 06/2021: LVEF 50-55%, mild LVH, diastolic parameters normal, right ventricular size and function normal, mild to moderate aortic stenosis (peak velocity 2.3 m/s, peak gradient 20, mean gradient 11.5 mmHg, AVA VTI 1.36 cm.,  Dimensional index 0.43)  01/25/2023: LVEF 55 to 60%, indeterminate diastolic filling pattern, mild aortic stenosis.  Stress Testing: Lexiscan Myoview stress test 03/12/2019: Low risk study  Heart Catheterization: 12/18/2020: Angiographically normal coronary arteries No coronary artery disease Mild aortic stenosis, mean PG 9 mmHg Likely etiology of dyspnea is COPD and borderline PH (mPAP 23 mmHg), likely WHO Grp III  RADIOLOGY: NA  Risk Assessment/Calculations:   NA   Labs:       Latest Ref Rng & Units 09/29/2022    2:37 PM 09/25/2021   12:43 PM 12/18/2020    8:03 AM  CBC  WBC 4.0 - 10.5  K/uL 6.4  7.5    Hemoglobin 12.0 - 15.0 g/dL 16.1  09.6  04.5   Hematocrit 36.0 - 46.0 % 40.0  43.1   34.0   Platelets 150 - 400 K/uL 118  130         Latest Ref Rng & Units 02/16/2023   10:13 AM 09/29/2022    2:37 PM 07/09/2022   10:29 AM  BMP  Glucose 70 - 99 mg/dL 409  97  811   BUN 8 - 27 mg/dL 17  21  21    Creatinine 0.57 - 1.00 mg/dL 9.14  7.82  9.56   BUN/Creat Ratio 12 - 28 17   21    Sodium 134 - 144 mmol/L 140  139  141   Potassium 3.5 - 5.2 mmol/L 3.8  4.1  4.2   Chloride 96 - 106 mmol/L 103  107  104   CO2 20 - 29 mmol/L 22  23  23    Calcium 8.7 - 10.3 mg/dL 21.3  9.8  08.6       Latest Ref Rng & Units 02/16/2023   10:13 AM 09/29/2022    2:37 PM 07/09/2022   10:29 AM  CMP  Glucose 70 - 99 mg/dL 578  97  469   BUN 8 - 27 mg/dL 17  21  21    Creatinine 0.57 - 1.00 mg/dL 6.29  5.28  4.13   Sodium 134 - 144 mmol/L 140  139  141   Potassium 3.5 - 5.2 mmol/L 3.8  4.1  4.2   Chloride 96 - 106 mmol/L 103  107  104   CO2 20 - 29 mmol/L 22  23  23    Calcium 8.7 - 10.3 mg/dL 24.4  9.8  01.0   Total Protein 6.5 - 8.1 g/dL  7.5    Total Bilirubin 0.3 - 1.2 mg/dL  1.6    Alkaline Phos 38 - 126 U/L  48    AST 15 - 41 U/L  32    ALT 0 - 44 U/L  16      Lab Results  Component Value Date   CHOL 166 12/09/2020   HDL 47 12/09/2020   LDLCALC 97 12/09/2020   LDLDIRECT 104 (H) 12/09/2020   TRIG 125 12/09/2020   No results for input(s): "LIPOA" in the last 8760 hours. No components found for: "NTPROBNP" Recent Labs    02/16/23 1013  PROBNP 84   No results for input(s): "TSH" in the last 8760 hours.  External Labs: Collected: 06/22/2022 Hemoglobin 14.7 g/dL, hematocrit 27.2%. Platelets 137. BUN 28, creatinine 1.4. eGFR 38. Sodium 136, potassium 4.5, chloride 103, bicarb 16 AST 29, ALT 19, alkaline phosphatase 63. Hemoglobin A1c 5.7. Magnesium 1.9  Physical Exam:    Today's Vitals   11/29/23 1348  BP: 120/70  Pulse: (!) 102  Resp: 16  SpO2: 95%  Weight: 179 lb (81.2 kg)  Height: 5\' 2"  (1.575 m)   Body mass index is 32.74 kg/m. Wt Readings from Last 3  Encounters:  11/29/23 179 lb (81.2 kg)  02/28/23 184 lb 3.2 oz (83.6 kg)  09/29/22 190 lb 14.4 oz (86.6 kg)    Physical Exam  Constitutional: No distress.  Age appropriate, hemodynamically stable.   Neck: No JVD present.  Cardiovascular: Regular rhythm, S1 normal, S2 normal, intact distal pulses and normal pulses. Tachycardia present. Exam reveals no gallop, no S3 and no S4.  Murmur heard. Harsh midsystolic murmur is present with a grade of 3/6 at  the upper right sternal border. Pulmonary/Chest: Effort normal and breath sounds normal. No stridor. She has no wheezes. She has no rales.  Abdominal: Soft. Bowel sounds are normal. She exhibits no distension. There is no abdominal tenderness.  Musculoskeletal:        General: No edema.     Cervical back: Neck supple.  Neurological: She is alert and oriented to person, place, and time. She has intact cranial nerves (2-12).  Skin: Skin is warm and moist.     Impression & Recommendation(s):  Impression:   ICD-10-CM   1. Chronic heart failure with preserved ejection fraction (HFpEF) (HCC)  I50.32 EKG 12-Lead    2. Nonrheumatic aortic valve stenosis  I35.0     3. Dyspnea on exertion  R06.09     4. Non-insulin dependent type 2 diabetes mellitus (HCC)  E11.9     5. PVC's (premature ventricular contractions)  I49.3 diltiazem (CARDIZEM CD) 120 MG 24 hr capsule    6. Benign hypertension  I10     7. Chronic obstructive pulmonary disease, unspecified COPD type (HCC)  J44.9     8. Former smoker  Z87.891        Recommendation(s):  Chronic heart failure with preserved ejection fraction (HFpEF) (HCC) Stage B, NYHA class II. Euvolemic on physical examination. Diagnosed prior to establishing care with myself, proximately August 2021. Uptitration of GDMT has been difficult due to age, renal function, and history of urinary tract infection/urgency. Continue losartan 25 mg p.o. daily. Jardiance was discontinued in the past due to yeast  infection x 2. Continue spironolactone 25 mg p.o. daily  Nonrheumatic aortic valve stenosis Chronic and stable Mild to moderate aortic stenosis Denies anginal chest pain, heart failure symptoms, near-syncope or syncopal events. Scheduled for repeat annual echocardiogram in May 2025 Monitor for now  Dyspnea on exertion Multifactorial. Chronic and stable. HFpEF management as discussed above. Aortic stenosis management as discussed above. Monitor for now  Non-insulin dependent type 2 diabetes mellitus (HCC) Most recent hemoglobin A1c 5.5 as of January 2025. Patient states that she has been told to discontinue metformin. Defer management to PCP  PVC's (premature ventricular contractions) Asymptomatic. Noted on surface ECG. She still remains tachycardic at baseline. Will restart Cardizem 120 mg p.o. daily with careful titration. Of note, in the past she has experienced AV nodal conduction disease and sinus bradycardia with higher doses of Cardizem. Monitor carefully  Benign hypertension Office blood pressures are well-controlled. Continue losartan 25 mg p.o. daily. Continue spironolactone 25 mg p.o. daily  Orders Placed:  Orders Placed This Encounter  Procedures   EKG 12-Lead     Final Medication List:    Meds ordered this encounter  Medications   diltiazem (CARDIZEM CD) 120 MG 24 hr capsule    Sig: Take 1 capsule (120 mg total) by mouth in the morning.    Dispense:  30 capsule    Refill:  3    Medications Discontinued During This Encounter  Medication Reason   metFORMIN (GLUCOPHAGE) 500 MG tablet Prescription never filled     Current Outpatient Medications:    acetaminophen (TYLENOL) 500 MG tablet, Take 500 mg by mouth every 6 (six) hours as needed., Disp: , Rfl:    albuterol (VENTOLIN HFA) 108 (90 Base) MCG/ACT inhaler, Inhale 2 puffs into the lungs every 4 (four) hours as needed for wheezing or shortness of breath., Disp: 6.7 g, Rfl: 5   Calcium Polycarbophil  (FIBER) 625 MG TABS, 2 tablets as needed Orally Three times a  day, Disp: , Rfl:    Cholecalciferol (D-3-5) 125 MCG (5000 UT) capsule, Take 10,000 Units by mouth daily., Disp: , Rfl:    CINNAMON PO, Take by mouth., Disp: , Rfl:    cyclobenzaprine (FLEXERIL) 5 MG tablet, Take 1 tablet (5 mg total) by mouth 3 (three) times daily as needed for muscle spasms., Disp: 30 tablet, Rfl: 0   diltiazem (CARDIZEM CD) 120 MG 24 hr capsule, Take 1 capsule (120 mg total) by mouth in the morning., Disp: 30 capsule, Rfl: 3   dimenhyDRINATE (DRAMAMINE) 50 MG tablet, Take 50 mg by mouth every 8 (eight) hours as needed for nausea., Disp: , Rfl:    Docusate Sodium (DSS) 100 MG CAPS, 1 capsule as needed Orally Once a day for 30 day(s), Disp: , Rfl:    losartan (COZAAR) 25 MG tablet, Take 1 tablet (25 mg total) by mouth daily., Disp: 90 tablet, Rfl: 0   magnesium oxide (MAG-OX) 400 MG tablet, Take 400 mg by mouth daily., Disp: , Rfl:    melatonin 3 MG TABS tablet, Take 6 mg by mouth at bedtime., Disp: , Rfl:    Misc Natural Products (APPLE CIDER VINEGAR DIET PO), Take by mouth., Disp: , Rfl:    Multiple Vitamins-Minerals (ONE-A-DAY WOMENS 50+ ADVANTAGE PO), Take 1 tablet by mouth daily., Disp: , Rfl:    rosuvastatin (CRESTOR) 10 MG tablet, TAKE 1 TABLET BY MOUTH EVERYDAY AT BEDTIME, Disp: 90 tablet, Rfl: 1   solifenacin (VESICARE) 10 MG tablet, Take 10 mg by mouth daily., Disp: , Rfl:    spironolactone (ALDACTONE) 25 MG tablet, Take 1 tablet (25 mg total) by mouth every morning., Disp: 90 tablet, Rfl: 2   traZODone (DESYREL) 50 MG tablet, Take 1 tablet by mouth at bedtime as needed., Disp: , Rfl:   Consent:   NA  Disposition:   19-month follow-up sooner if needed  Her questions and concerns were addressed to her satisfaction. She voices understanding of the recommendations provided during this encounter.    Signed, Tessa Lerner, DO, [facility]  [facility]  [address] [place], [place]  [address] 11/29/2023 8:00 PM

## 2023-11-29 NOTE — Patient Instructions (Addendum)
 Medication Instructions:  Your physician has recommended you make the following change in your medication:   START Diltiazem (Cardizem) 120 mg once daily in the morning  *If you need a refill on your cardiac medications before your next appointment, please call your pharmacy*  Lab Work: None ordered today. If you have labs (blood work) drawn today and your tests are completely normal, you will receive your results only by: MyChart Message (if you have MyChart) OR A paper copy in the mail If you have any lab test that is abnormal or we need to change your treatment, we will call you to review the results.  Testing/Procedures: None ordered today.  Follow-Up: At [facility], you and your health needs are our priority.  As part of our continuing mission to provide you with exceptional heart care, we have created designated Provider Care Teams.  These Care Teams include your primary Cardiologist (physician) and Advanced Practice Providers (APPs -  Physician Assistants and Nurse Practitioners) who all work together to provide you with the care you need, when you need it.  We recommend signing up for the patient portal called "MyChart".  Sign up information is provided on this After Visit Summary.  MyChart is used to connect with patients for Virtual Visits (Telemedicine).  Patients are able to view lab/test results, encounter notes, upcoming appointments, etc.  Non-urgent messages can be sent to your provider as well.   To learn more about what you can do with MyChart, go to [email or web address].    Your next appointment:   6 month(s)  The format for your next appointment:   In Person  Provider:   Tessa Lerner, DO {

## 2023-12-05 ENCOUNTER — Telehealth: Payer: Self-pay | Admitting: Cardiology

## 2023-12-05 DIAGNOSIS — I493 Ventricular premature depolarization: Secondary | ICD-10-CM

## 2023-12-05 NOTE — Telephone Encounter (Signed)
 1. Name of Medication:  diltiazem (CARDIZEM CD) 120 MG 24 hr capsule  2. How are you currently taking this medication (dosage and times per day)?  As prescribed  3. Are you having a reaction (difficulty breathing--STAT)?   4. What is your medication issue?   Patient says this medication caused indigestion, nausea, and swelling in feet/legs. She says she doesn't take a fluid pill + never had issues with swelling. She says she looked up the side effect and they are exacting what she's experiencing. Please advise.

## 2023-12-05 NOTE — Telephone Encounter (Signed)
 Spoke with pt and she stated that she started taking Cardizem 3/26 and beginning 3/28 she was experiencing bad edema in her lower legs. Spoke with Dr. Elberta Fortis (DOD 3/31 from 10a-2p) about this and he advised to hold this medication until Dr. Odis Hollingshead is back in town and we can revisit the plan of care since pt states she doesn't want to continue taking it with the edema. Pt verbalized understanding of the plan. Pt told to call our office back with any questions or concerns.

## 2023-12-05 NOTE — Telephone Encounter (Signed)
 You can list the diltiazem as an intolerance as a cause her indigestion, nausea, and swelling in feet/legs.  It was prescribed to her due to PVCs.  Alternative could be Toprol-XL 25 mg p.o. daily if patient is willing to consider.  [name] Mckibbin Chester, DO, [facility]

## 2023-12-06 ENCOUNTER — Other Ambulatory Visit: Payer: Self-pay

## 2023-12-06 MED ORDER — METOPROLOL SUCCINATE ER 25 MG PO TB24: 25.0000 mg | ORAL_TABLET | Freq: Every day | ORAL | 3 refills | Status: DC

## 2023-12-06 NOTE — Telephone Encounter (Signed)
 Spoke with pt and explained that we could try Toprol-XL 25 mg once daily in the mornings in place of the Diltiazem that the pt had an intolerance to. Pt agreed to trying this plan of care. Order for Toprol-XL sent to pt preferred pharmacy.

## 2023-12-06 NOTE — Addendum Note (Signed)
 Addended by: Cherylann Banas on: 12/06/2023 01:39 PM   Modules accepted: Orders

## 2023-12-07 DIAGNOSIS — M25551 Pain in right hip: Secondary | ICD-10-CM | POA: Diagnosis not present

## 2023-12-22 DIAGNOSIS — M6281 Muscle weakness (generalized): Secondary | ICD-10-CM | POA: Diagnosis not present

## 2023-12-22 DIAGNOSIS — K644 Residual hemorrhoidal skin tags: Secondary | ICD-10-CM | POA: Diagnosis not present

## 2023-12-22 DIAGNOSIS — N898 Other specified noninflammatory disorders of vagina: Secondary | ICD-10-CM | POA: Diagnosis not present

## 2023-12-22 DIAGNOSIS — R2689 Other abnormalities of gait and mobility: Secondary | ICD-10-CM | POA: Diagnosis not present

## 2023-12-22 DIAGNOSIS — M25551 Pain in right hip: Secondary | ICD-10-CM | POA: Diagnosis not present

## 2023-12-28 DIAGNOSIS — B356 Tinea cruris: Secondary | ICD-10-CM | POA: Diagnosis not present

## 2023-12-28 DIAGNOSIS — K649 Unspecified hemorrhoids: Secondary | ICD-10-CM | POA: Diagnosis not present

## 2024-01-17 ENCOUNTER — Other Ambulatory Visit (HOSPITAL_COMMUNITY)

## 2024-01-20 ENCOUNTER — Ambulatory Visit (HOSPITAL_COMMUNITY)
Admission: RE | Admit: 2024-01-20 | Discharge: 2024-01-20 | Disposition: A | Discharge status: Home / Self Care | Source: Ambulatory Visit | Attending: Cardiology | Admitting: Cardiology

## 2024-01-20 DIAGNOSIS — I350 Nonrheumatic aortic (valve) stenosis: Secondary | ICD-10-CM | POA: Diagnosis not present

## 2024-01-21 LAB — ECHOCARDIOGRAM COMPLETE
AR max vel: 0.88 cm2
AV Area VTI: 0.83 cm2
AV Area mean vel: 0.92 cm2
AV Mean grad: 27 mmHg
AV Peak grad: 47.1 mmHg
Ao pk vel: 3.43 m/s
Area-P 1/2: 3.77 cm2
Est EF: 50
S' Lateral: 3.5 cm

## 2024-01-23 ENCOUNTER — Ambulatory Visit: Payer: Self-pay | Admitting: Cardiology

## 2024-01-26 DIAGNOSIS — K649 Unspecified hemorrhoids: Secondary | ICD-10-CM | POA: Diagnosis not present

## 2024-01-26 DIAGNOSIS — B356 Tinea cruris: Secondary | ICD-10-CM | POA: Diagnosis not present

## 2024-02-23 DIAGNOSIS — R252 Cramp and spasm: Secondary | ICD-10-CM | POA: Diagnosis not present

## 2024-02-23 DIAGNOSIS — M79644 Pain in right finger(s): Secondary | ICD-10-CM | POA: Diagnosis not present

## 2024-02-23 DIAGNOSIS — M25551 Pain in right hip: Secondary | ICD-10-CM | POA: Diagnosis not present

## 2024-03-20 ENCOUNTER — Other Ambulatory Visit: Payer: Self-pay

## 2024-03-20 DIAGNOSIS — R0609 Other forms of dyspnea: Secondary | ICD-10-CM

## 2024-03-20 DIAGNOSIS — I5032 Chronic diastolic (congestive) heart failure: Secondary | ICD-10-CM

## 2024-03-20 MED ORDER — SPIRONOLACTONE 25 MG PO TABS: 25.0000 mg | ORAL_TABLET | Freq: Every morning | ORAL | 2 refills | Status: DC

## 2024-05-31 ENCOUNTER — Ambulatory Visit: Attending: Cardiology | Admitting: Cardiology

## 2024-06-01 ENCOUNTER — Encounter: Payer: Self-pay | Admitting: Cardiology

## 2024-06-07 DIAGNOSIS — M25561 Pain in right knee: Secondary | ICD-10-CM | POA: Diagnosis not present

## 2024-06-13 ENCOUNTER — Encounter: Payer: Self-pay | Admitting: Cardiology

## 2024-06-13 ENCOUNTER — Ambulatory Visit: Attending: Cardiology | Admitting: Cardiology

## 2024-06-13 VITALS — BP 110/70 | HR 80 | Ht 61.0 in | Wt 177.8 lb

## 2024-06-13 DIAGNOSIS — E119 Type 2 diabetes mellitus without complications: Secondary | ICD-10-CM

## 2024-06-13 DIAGNOSIS — I10 Essential (primary) hypertension: Secondary | ICD-10-CM

## 2024-06-13 DIAGNOSIS — I493 Ventricular premature depolarization: Secondary | ICD-10-CM | POA: Diagnosis not present

## 2024-06-13 DIAGNOSIS — I5032 Chronic diastolic (congestive) heart failure: Secondary | ICD-10-CM | POA: Diagnosis not present

## 2024-06-13 DIAGNOSIS — I350 Nonrheumatic aortic (valve) stenosis: Secondary | ICD-10-CM

## 2024-06-13 NOTE — Progress Notes (Signed)
 Cardiology Office Note:  .   Date:  06/13/2024  ID:  [name] [name], DOB 01/26/1941, MRN [id number] PCP:  Katina Pfeiffer, PA-C  Former Cardiology Providers: Emmalene Lawrence, APRN, FNP-C  [facility] Providers Cardiologist:  [name], DO , [facility] (established care 05/06/2020) Electrophysiologist:  None  Click to update primary MD,subspecialty MD or APP then REFRESH:1}    Chief Complaint  Patient presents with   Follow-up    HFpEF, aortic stenosis    History of Present Illness: .   [name] [name] is a 83 y.o. Caucasian female whose past medical history and cardiovascular risk factors includes: aortic stenosis, hypertension, hyperlipidemia, HFpEF/stage B/ NYHA II, COPD, OA, depression, history of breast cancer s/p lumpectomy in 2005 and radiation and chemotherapy in 2006, non-alcoholic cirrhosis of liver in 2015, and former tobacco use.   Patient being followed by the practice given her history of chronic HFpEF, aortic stenosis, palpitations/PVCs.  In the past, GDMT has been difficult to uptitrate due to advanced age, renal function, history of urinary tract infection/urgency.  With regards to her aortic stenosis she has been monitored clinically with serial echocardiograms as she has not complained of anginal chest pain, heart failure symptoms, near-syncope or syncopal events.  Given her history of PVCs and palpitations she was on diltiazem  but eventually developed sinus bradycardia with A-V dissociation and ventricular escape beats. And since then diltiazem  has been discontinued.  Transition to low-dose Toprol -XL 25 mg p.o. daily  Patient presents today for routine follow-up visit. She denies anginal chest pain. She does have shortness of breath with effort related activities but states overall intensity frequency and duration has not changed.  Reviewed the results of the recent echocardiogram which illustrated mild reduction in LVEF from 55-60% to 50%.  Severity of aortic  stenosis also is now moderate to severe with a peak velocity of 3.4 m/s, mean gradient of 27 mmHg, aortic valve area per VTI 0.83 cm, and dimensional index <0.25.  In summary her LVEF is trending lower and aortic valve hemodynamics are worsening.   Review of Systems: .   Review of Systems  Cardiovascular:  Negative for chest pain, claudication, irregular heartbeat, leg swelling, near-syncope, orthopnea, palpitations, paroxysmal nocturnal dyspnea and syncope.  Respiratory:  Negative for shortness of breath.   Hematologic/Lymphatic: Negative for bleeding problem.    Studies Reviewed:    Echocardiogram: 06/2021: LVEF 50-55%, mild LVH, diastolic parameters normal, right ventricular size and function normal, mild to moderate aortic stenosis (peak velocity 2.3 m/s, peak gradient 20, mean gradient 11.5 mmHg, AVA VTI 1.36 cm.,  Dimensional index 0.43)  01/25/2023: LVEF 55 to 60%, indeterminate diastolic filling pattern, mild aortic stenosis.  01/20/2024  1. Left ventricular ejection fraction, by estimation, is 50%. The left  ventricle has low normal function. The left ventricle has no regional wall  motion abnormalities. There is mild asymmetric left ventricular  hypertrophy of the infero-lateral segment.  Left ventricular diastolic parameters are consistent with Grade I  diastolic dysfunction (impaired relaxation).   2. Right ventricular systolic function is mildly reduced. The right  ventricular size is normal. Tricuspid regurgitation signal is inadequate  for assessing PA pressure.   3. The mitral valve is degenerative. Trivial mitral valve regurgitation.  No evidence of mitral stenosis.   4. The tricuspid valve is degenerative, mild TR. Appears eccentric, may  be underestimated.   5. The aortic valve is abnormal. There is severe calcifcation of the  aortic valve. Aortic valve regurgitation is not visualized. Moderate  aortic valve stenosis. Aortic valve area, by VTI measures 0.83 cm.  Aortic  valve mean gradient measures 27.0 mmHg.  Aortic valve Vmax measures 3.43 m/s. DI 0.24  6. The inferior vena cava is normal in size with <50% respiratory  variability, suggesting right atrial pressure of 8 mmHg.   Stress Testing: Lexiscan  Myoview  stress test 03/12/2019: Low risk study  Heart Catheterization: 12/18/2020: Angiographically normal coronary arteries No coronary artery disease Mild aortic stenosis, mean PG 9 mmHg Likely etiology of dyspnea is COPD and borderline PH (mPAP 23 mmHg), likely WHO Grp III  RADIOLOGY: NA  Risk Assessment/Calculations:   NA   Labs:       Latest Ref Rng & Units 09/29/2022    2:37 PM 09/25/2021   12:43 PM 12/18/2020    8:03 AM  CBC  WBC 4.0 - 10.5 K/uL 6.4  7.5    Hemoglobin 12.0 - 15.0 g/dL 85.4  84.9  88.3   Hematocrit 36.0 - 46.0 % 40.0  43.1  34.0   Platelets 150 - 400 K/uL 118  130         Latest Ref Rng & Units 02/16/2023   10:13 AM 09/29/2022    2:37 PM 07/09/2022   10:29 AM  BMP  Glucose 70 - 99 mg/dL 823  97  864   BUN 8 - 27 mg/dL 17  21  21    Creatinine 0.57 - 1.00 mg/dL 8.97  9.05  8.97   BUN/Creat Ratio 12 - 28 17   21    Sodium 134 - 144 mmol/L 140  139  141   Potassium 3.5 - 5.2 mmol/L 3.8  4.1  4.2   Chloride 96 - 106 mmol/L 103  107  104   CO2 20 - 29 mmol/L 22  23  23    Calcium  8.7 - 10.3 mg/dL 89.7  9.8  89.9       Latest Ref Rng & Units 02/16/2023   10:13 AM 09/29/2022    2:37 PM 07/09/2022   10:29 AM  CMP  Glucose 70 - 99 mg/dL 823  97  864   BUN 8 - 27 mg/dL 17  21  21    Creatinine 0.57 - 1.00 mg/dL 8.97  9.05  8.97   Sodium 134 - 144 mmol/L 140  139  141   Potassium 3.5 - 5.2 mmol/L 3.8  4.1  4.2   Chloride 96 - 106 mmol/L 103  107  104   CO2 20 - 29 mmol/L 22  23  23    Calcium  8.7 - 10.3 mg/dL 89.7  9.8  89.9   Total Protein 6.5 - 8.1 g/dL  7.5    Total Bilirubin 0.3 - 1.2 mg/dL  1.6    Alkaline Phos 38 - 126 U/L  48    AST 15 - 41 U/L  32    ALT 0 - 44 U/L  16      Lab Results  Component  Value Date   CHOL 166 12/09/2020   HDL 47 12/09/2020   LDLCALC 97 12/09/2020   LDLDIRECT 104 (H) 12/09/2020   TRIG 125 12/09/2020   No results for input(s): LIPOA in the last 8760 hours. No components found for: NTPROBNP No results for input(s): PROBNP in the last 8760 hours.  No results for input(s): TSH in the last 8760 hours.  External Labs: Collected: 06/22/2022 Hemoglobin 14.7 g/dL, hematocrit 57.0%. Platelets 137. BUN 28, creatinine 1.4. eGFR 38. Sodium 136, potassium 4.5, chloride 103, bicarb 16 AST  29, ALT 19, alkaline phosphatase 63. Hemoglobin A1c 5.7. Magnesium  1.9  Physical Exam:    Today's Vitals   06/13/24 0950  BP: 110/70  Pulse: 80  SpO2: 96%  Weight: 177 lb 12.8 oz (80.6 kg)  Height: 5' 1 (1.549 m)   Body mass index is 33.6 kg/m. Wt Readings from Last 3 Encounters:  06/13/24 177 lb 12.8 oz (80.6 kg)  11/29/23 179 lb (81.2 kg)  02/28/23 184 lb 3.2 oz (83.6 kg)    Physical Exam  Constitutional: No distress.  Age appropriate, hemodynamically stable.   Neck: No JVD present.  Cardiovascular: Normal rate, regular rhythm, S1 normal, S2 normal, intact distal pulses and normal pulses. Exam reveals no gallop, no S3 and no S4.  Murmur heard. Harsh midsystolic murmur is present with a grade of 3/6 at the upper right sternal border. Pulses:      Carotid pulses are  on the right side with bruit and  on the left side with bruit. Pulmonary/Chest: Effort normal and breath sounds normal. No stridor. She has no wheezes. She has no rales.  Abdominal: Soft. Bowel sounds are normal. She exhibits no distension. There is no abdominal tenderness.  Musculoskeletal:        General: No edema.     Cervical back: Neck supple.  Neurological: She is alert and oriented to person, place, and time. She has intact cranial nerves (2-12).  Skin: Skin is warm and moist.     Impression & Recommendation(s):  Impression:   ICD-10-CM   1. Nonrheumatic aortic valve  stenosis  I35.0 Ambulatory referral to Structural Heart/Valve Clinic (only at [facility])    2. Chronic heart failure with preserved ejection fraction (HFpEF) (HCC)  I50.32     3. PVC's (premature ventricular contractions)  I49.3     4. Non-insulin dependent type 2 diabetes mellitus (HCC)  E11.9     5. Benign hypertension  I10         Recommendation(s):  Nonrheumatic aortic valve stenosis Has been monitored with routine echocardiograms as clinically she has remained asymptomatic in the past. Recent echocardiogram notes reduction in LVEF as well as aortic valve hemodynamics have worsened.  Based on the aortic valve area, dimensional index severity of aortic stenosis is severe. Patient denies anginal chest pain, heart failure symptoms, or near syncope/syncope. Given the reduction in LVEF recommended aortic valve replacement. Given her advanced age and comorbidities she would likely be a TAVR candidate. Patient states that she is willing to have it done would like to be evaluated by structural heart team prior to undergoing left and right heart catheterization and TAVR workup. Will refer her to structural heart team for consideration of candidacy for TAVR given the hemodynamics and change in LVEF.  Chronic heart failure with preserved ejection fraction (HFpEF) (HCC) Stage B, NYHA class II. Diagnosed around August 2021, Prior to establishing care with myself Euvolemic on physical examination. Continue losartan  25 mg p.o. daily. Continue Toprol -XL 25 mg p.o. daily. Continue spironolactone  25 mg p.o. daily Jardiance  discontinued in the past due to yeast infection x 2 A1c 5.5, January 2025. LDL 43 mg/dL, January 7974. Triglycerides 80 mg/dL, January 2025  PVC's (premature ventricular contractions) Asymptomatic. Did not tolerate Cardizem  in the past. Currently on low-dose Toprol -XL  Non-insulin dependent type 2 diabetes mellitus (HCC) Remains on oral antiglycemic agents. Hemoglobin  A1c 5.5 as of January 2025  Benign hypertension Office blood pressures are well-controlled. Continue current medical therapy as discussed above   Orders Placed:  Orders Placed This Encounter  Procedures   Ambulatory referral to Structural Heart/Valve Clinic (only at [facility])    Referral Priority:   Routine    Referral Type:   Consultation    Referral Reason:   Specialty Services Required    Referred to Provider:   Thukkani, Arun K, MD    Requested Specialty:   Cardiology    Number of Visits Requested:   1     Final Medication List:    No orders of the defined types were placed in this encounter.   Medications Discontinued During This Encounter  Medication Reason   cyclobenzaprine  (FLEXERIL ) 5 MG tablet Completed Course     Current Outpatient Medications:    acetaminophen  (TYLENOL ) 500 MG tablet, Take 500 mg by mouth every 6 (six) hours as needed., Disp: , Rfl:    albuterol  (VENTOLIN  HFA) 108 (90 Base) MCG/ACT inhaler, Inhale 2 puffs into the lungs every 4 (four) hours as needed for wheezing or shortness of breath., Disp: 6.7 g, Rfl: 5   Calcium  Polycarbophil (FIBER) 625 MG TABS, 2 tablets as needed Orally Three times a day, Disp: , Rfl:    Cholecalciferol (D-3-5) 125 MCG (5000 UT) capsule, Take 10,000 Units by mouth daily., Disp: , Rfl:    CINNAMON PO, Take by mouth., Disp: , Rfl:    dimenhyDRINATE (DRAMAMINE) 50 MG tablet, Take 50 mg by mouth every 8 (eight) hours as needed for nausea., Disp: , Rfl:    Docusate Sodium (DSS) 100 MG CAPS, 1 capsule as needed Orally Once a day for 30 day(s), Disp: , Rfl:    losartan  (COZAAR ) 25 MG tablet, Take 1 tablet (25 mg total) by mouth daily., Disp: 90 tablet, Rfl: 0   magnesium  oxide (MAG-OX) 400 MG tablet, Take 400 mg by mouth daily., Disp: , Rfl:    melatonin 3 MG TABS tablet, Take 6 mg by mouth at bedtime., Disp: , Rfl:    metoprolol  succinate (TOPROL  XL) 25 MG 24 hr tablet, Take 1 tablet (25 mg total) by mouth daily., Disp: 30  tablet, Rfl: 3   Misc Natural Products (APPLE CIDER VINEGAR DIET PO), Take by mouth., Disp: , Rfl:    Multiple Vitamins-Minerals (ONE-A-DAY WOMENS 50+ ADVANTAGE PO), Take 1 tablet by mouth daily., Disp: , Rfl:    rosuvastatin  (CRESTOR ) 10 MG tablet, TAKE 1 TABLET BY MOUTH EVERYDAY AT BEDTIME, Disp: 90 tablet, Rfl: 1   solifenacin (VESICARE) 10 MG tablet, Take 10 mg by mouth daily., Disp: , Rfl:    spironolactone  (ALDACTONE ) 25 MG tablet, Take 1 tablet (25 mg total) by mouth every morning., Disp: 90 tablet, Rfl: 2   traZODone (DESYREL) 50 MG tablet, Take 1 tablet by mouth at bedtime as needed., Disp: , Rfl:   Consent:   NA  Disposition:   71-month follow-up sooner if needed  Her questions and concerns were addressed to her satisfaction. She voices understanding of the recommendations provided during this encounter.    Signed, Madonna Michele HAS, [facility]  A Division of [facility] [address], [place], [place] [address]   06/13/2024 1:12 PM

## 2024-06-13 NOTE — Patient Instructions (Signed)
 Medication Instructions:  .instcu  *If you need a refill on your cardiac medications before your next appointment, please call your pharmacy*  Lab Work: None ordered  If you have labs (blood work) drawn today and your tests are completely normal, you will receive your results only by: MyChart Message (if you have MyChart) OR A paper copy in the mail If you have any lab test that is abnormal or we need to change your treatment, we will call you to review the results.  Testing/Procedures: None ordered  You have been referred to STRUCTURAL HEART TEAM, DR. WENDEL   Follow-Up: At [facility], you and your health needs are our priority.  As part of our continuing mission to provide you with exceptional heart care, our providers are all part of one team.  This team includes your primary Cardiologist (physician) and Advanced Practice Providers or APPs (Physician Assistants and Nurse Practitioners) who all work together to provide you with the care you need, when you need it.  Your next appointment:   6 month(s)  Provider:   [name], DO    We recommend signing up for the patient portal called MyChart.  Sign up information is provided on this After Visit Summary.  MyChart is used to connect with patients for Virtual Visits (Telemedicine).  Patients are able to view lab/test results, encounter notes, upcoming appointments, etc.  Non-urgent messages can be sent to your provider as well.   To learn more about what you can do with MyChart, go to [email or web address].   Other Instructions

## 2024-06-13 NOTE — Progress Notes (Signed)
 Patient ID: [name] [name] MRN: [id number] DOB/AGE: October 02, 1940 83 y.o.  Primary Care Physician:Wharton, Charmaine, [place] Primary Cardiologist: Michele  CC:  Aortic valvular disease management     FOCUSED PROBLEM LIST:   Aortic stenosis AVA 0.83, MG 27, SVI 38, V-max 3.4, EF 50% TTE May 2025 Sinus tachycardia with occasional PVCs and no bundle-branch blocks T2DM Not on insulin, diet controlled Nonischemic cardiomyopathy No CAD coronary angiography 2022 EF 50 to 55% TTE February 2022 EF 50 to 55% TTE October 2022 EF 55 to 60% TTE May 2024 EF 50% TTE May 2025 Breast cancer Status postlumpectomy 2005 XRT and CTX 2006 Nonalcoholic cirrhosis Hyperlipidemia Aortic atherosclerosis Chest CT 2022 Hypertension BMI 33/BSA 1.14 June 2024: Patient consents to use of AI scribe. The patient is a 83 year old female with the above listed medical problems referred by Dr. Michele for recommendation regarding her aortic valvular disease.  She was seen last she did report shortness of breath.  This is relatively unchanged versus previous.  She has no current symptoms such as shortness of breath, chest discomfort, or lightheadedness. She is able to perform daily activities without difficulty, including attending church three times a week and volunteering at a food bank. No significant slowing down in her activities has been noticed over the past few years.  Her past medical history includes breast cancer, for which she underwent a lumpectomy, radiation, and chemotherapy in 2005. She remains in remission, and her annual check-ups have shown no recurrence of cancer. She also has a history of cirrhosis, which was explained to her as not being related to alcohol use but possibly due to cholesterol deposits in the liver. She is not a drinker.  She was previously diagnosed with diabetes but no longer experiences symptoms. She manages her condition through diet, primarily consuming vegetables, fruits,  sweet tea, and water. Her glucose levels have been normal at recent check-ups.  In terms of her social history, she lives alone, drives, and maintains her household independently. She is actively involved in her church community, which she credits with providing her with a strong social network.  During the review of symptoms, she reports no lightheadedness when standing, no significant swelling in her legs except for neuropathy-related swelling in her foot, and no breathing difficulties when lying down. She does not sleep flat on her back.   She has upper and lower partial dentures.        Past Medical History:  Diagnosis Date   Aortic stenosis    Breast cancer (HCC)    Cancer (HCC)    Chest pain    Chronic kidney disease    Cirrhosis (HCC)    COPD (chronic obstructive pulmonary disease) (HCC)    DJD (degenerative joint disease)    GERD (gastroesophageal reflux disease)    Hyperlipidemia    Hypertension    Leg edema    Orthopnea    Palpitation    Personal history of radiation therapy    SOB (shortness of breath)    Vitamin D  deficiency     Past Surgical History:  Procedure Laterality Date   BREAST BIOPSY     BREAST LUMPECTOMY Left    CARDIAC CATHETERIZATION     CORONARY ANGIOPLASTY     LAPAROSCOPIC TOTAL HYSTERECTOMY     RIGHT/LEFT HEART CATH AND CORONARY ANGIOGRAPHY N/A 12/18/2020   Procedure: RIGHT/LEFT HEART CATH AND CORONARY ANGIOGRAPHY;  Surgeon: Elmira Newman PARAS, MD;  Location: [facility];  Service: Cardiovascular;  Laterality: N/A;  Family History  Problem Relation Age of Onset   Heart attack Mother    Stroke Mother    Cardiomyopathy Father     Social History   Socioeconomic History   Marital status: Widowed    Spouse name: Not on file   Number of children: 4   Years of education: Not on file   Highest education level: Not on file  Occupational History   Not on file  Tobacco Use   Smoking status: Former    Current packs/day: 0.00     Average packs/day: 0.5 packs/day for 42.6 years (21.3 ttl pk-yrs)    Types: Cigarettes    Start date: 02/12/1961    Quit date: 2005    Years since quitting: 20.7   Smokeless tobacco: Never  Vaping Use   Vaping status: Never Used  Substance and Sexual Activity   Alcohol use: Never   Drug use: Never   Sexual activity: Not on file  Other Topics Concern   Not on file  Social History Narrative   Not on file   Social Drivers of Health   Financial Resource Strain: Not on file  Food Insecurity: Not on file  Transportation Needs: Not on file  Physical Activity: Not on file  Stress: Not on file  Social Connections: Not on file  Intimate Partner Violence: Not on file     Prior to Admission medications   Medication Sig Start Date End Date Taking? Authorizing Provider  acetaminophen  (TYLENOL ) 500 MG tablet Take 500 mg by mouth every 6 (six) hours as needed.    [provider]  albuterol  (VENTOLIN  HFA) 108 (90 Base) MCG/ACT inhaler Inhale 2 puffs into the lungs every 4 (four) hours as needed for wheezing or shortness of breath. 03/17/21 06/13/24  Kara Dorn NOVAK, MD  Calcium  Polycarbophil (FIBER) 625 MG TABS 2 tablets as needed Orally Three times a day    [provider]  Cholecalciferol (D-3-5) 125 MCG (5000 UT) capsule Take 10,000 Units by mouth daily.    [provider]  CINNAMON PO Take by mouth.    [provider]  dimenhyDRINATE (DRAMAMINE) 50 MG tablet Take 50 mg by mouth every 8 (eight) hours as needed for nausea.    [provider]  Docusate Sodium (DSS) 100 MG CAPS 1 capsule as needed Orally Once a day for 30 day(s)    [provider]  losartan  (COZAAR ) 25 MG tablet Take 1 tablet (25 mg total) by mouth daily. 07/05/22 06/13/24  Tolia, Sunit, DO  magnesium  oxide (MAG-OX) 400 MG tablet Take 400 mg by mouth daily.    [provider]  melatonin 3 MG TABS tablet Take 6 mg by mouth at bedtime.    [provider]   metoprolol  succinate (TOPROL  XL) 25 MG 24 hr tablet Take 1 tablet (25 mg total) by mouth daily. 12/06/23   Tolia, Sunit, DO  Misc Natural Products (APPLE CIDER VINEGAR DIET PO) Take by mouth.    [provider]  Multiple Vitamins-Minerals (ONE-A-DAY WOMENS 50+ ADVANTAGE PO) Take 1 tablet by mouth daily.    [provider]  rosuvastatin  (CRESTOR ) 10 MG tablet TAKE 1 TABLET BY MOUTH EVERYDAY AT BEDTIME 06/09/21   Tolia, Sunit, DO  solifenacin (VESICARE) 10 MG tablet Take 10 mg by mouth daily. 06/23/22   [provider]  spironolactone  (ALDACTONE ) 25 MG tablet Take 1 tablet (25 mg total) by mouth every morning. 03/20/24   Tolia, Sunit, DO  traZODone (DESYREL) 50 MG tablet  Take 1 tablet by mouth at bedtime as needed.    [provider]    Allergies  Allergen Reactions   Cardizem  [Diltiazem ] Swelling    Lower leg edema    Empagliflozin  Other (See Comments)    REVIEW OF SYSTEMS:  General: no fevers/chills/night sweats Eyes: no blurry vision, diplopia, or amaurosis ENT: no sore throat or hearing loss Resp: no cough, wheezing, or hemoptysis CV: no edema or palpitations GI: no abdominal pain, nausea, vomiting, diarrhea, or constipation GU: no dysuria, frequency, or hematuria Skin: no rash Neuro: no headache, numbness, tingling, or weakness of extremities Musculoskeletal: no joint pain or swelling Heme: no bleeding, DVT, or easy bruising Endo: no polydipsia or polyuria  BP 134/64   Pulse 84   Ht 5' 2 (1.575 m)   Wt 177 lb 3.2 oz (80.4 kg)   SpO2 95%   BMI 32.41 kg/m   PHYSICAL EXAM: GEN:  AO x 3 in no acute distress HEENT: normal Dentition: Partial uppers and lowers Neck: JVP normal. +2 carotid upstrokes without bruits. No thyromegaly. Lungs: equal expansion, clear bilaterally CV: Apex is discrete and nondisplaced, RRR with 3/6 Abd: soft, non-tender, non-distended; no bruit; positive bowel sounds Ext: no edema, ecchymoses, or cyanosis Vascular:  2+ femoral pulses, 2+ radial pulses       Skin: warm and dry without rash Neuro: CN II-XII grossly intact; motor and sensory grossly intact    DATA AND STUDIES:  EKG:   EKG Interpretation Date/Time:  Tuesday June 19 2024 09:21:52 EDT Ventricular Rate:  87 PR Interval:  146 QRS Duration:  128 QT Interval:  380 QTC Calculation: 457 R Axis:   -16  Text Interpretation: Sinus rhythm with occasional Premature ventricular complexes Left ventricular hypertrophy with QRS widening ( R in aVL , Cornell product ) Cannot rule out Anteroseptal infarct (cited on or before 29-Nov-2023) When compared with ECG of 29-Nov-2023 13:52, QRS axis Shifted right Questionable change in initial forces of Anterior leads ST no longer elevated in Inferior leads T wave inversion now evident in Inferior leads Confirmed by Wendel Haws ([id number]) on 06/19/2024 9:23:33 AM        CARDIAC STUDIES: Refer to CV Procedures and Imaging Tabs  No results found for requested labs within last 365 days.   STS RISK CALCULATOR: Pending  NYHA CLASS: 1    ASSESSMENT AND PLAN:   1. Nonrheumatic aortic valve stenosis   2. Chronic heart failure with preserved ejection fraction (HFpEF) (HCC)   3. Malignant neoplasm of female breast, unspecified estrogen receptor status, unspecified laterality, unspecified site of breast (HCC)   4. Cirrhosis, nonalcoholic (HCC)   5. Hyperlipidemia LDL goal <70   6. Aortic atherosclerosis   7. Primary hypertension   8. Type 2 diabetes mellitus without complication, without long-term current use of insulin (HCC)   9. Pre-procedure lab exam     Aortic stenosis: Had a long conversation with the patient and her son.  She feels relatively well but she is concerned about the decrement in her LV function.  We also talked about the results of the early TAVR trial showing decreased risk for heart failure hospitalization without an associated mortality benefit.  After answering all of her questions  she has elected to pursue aortic valve intervention.  I refer the patient for right heart catheterization, coronary angiography study, CTA, and cardiothoracic surgical appointment. Chronic systolic heart failure: Continue Toprol  25 mg, losartan  25 mg, spironolactone  25 mg. History of breast cancer: In remission. Cirrhosis: LFTs  seem to be normal; no PT or INR assessed recently. Hyperlipidemia: Continue rosuvastatin  10 mg. Aortic atherosclerosis: Continue rosuvastatin  10 mg; consider aspirin  81 mg. Hypertension: Continue losartan  25 mg, Toprol  25 mg, spironolactone  25 mg.  BP is well-controlled today.   I have personally reviewed the patients imaging data as summarized above.  I have reviewed the natural history of aortic stenosis with the patient and family members who are present today. We have discussed the limitations of medical therapy and the poor prognosis associated with symptomatic aortic stenosis. We have also reviewed potential treatment options, including palliative medical therapy, conventional surgical aortic valve replacement, and transcatheter aortic valve replacement. We discussed treatment options in the context of this patient's specific comorbid medical conditions.   All of the patient's questions were answered today. Will make further recommendations based on the results of studies outlined above.   I spent 42 minutes reviewing all clinical data during and prior to this visit including all relevant imaging studies, laboratories, clinical information from other health systems and prior notes from both Cardiology and other specialties, interviewing the patient, conducting a complete physical examination, and coordinating care in order to formulate a comprehensive and personalized evaluation and treatment plan.   Jacques Fife K Karrine Kluttz, MD  06/19/2024 10:10 AM    [facility] [address] [place], [place], [place]  [address] Phone: [phone]; Fax: [phone]

## 2024-06-13 NOTE — H&P (View-Only) (Signed)
 Patient ID: [name] [name] MRN: [id number] DOB/AGE: October 02, 1940 83 y.o.  Primary Care Physician:Wharton, Charmaine, [place] Primary Cardiologist: Michele  CC:  Aortic valvular disease management     FOCUSED PROBLEM LIST:   Aortic stenosis AVA 0.83, MG 27, SVI 38, V-max 3.4, EF 50% TTE May 2025 Sinus tachycardia with occasional PVCs and no bundle-branch blocks T2DM Not on insulin, diet controlled Nonischemic cardiomyopathy No CAD coronary angiography 2022 EF 50 to 55% TTE February 2022 EF 50 to 55% TTE October 2022 EF 55 to 60% TTE May 2024 EF 50% TTE May 2025 Breast cancer Status postlumpectomy 2005 XRT and CTX 2006 Nonalcoholic cirrhosis Hyperlipidemia Aortic atherosclerosis Chest CT 2022 Hypertension BMI 33/BSA 1.14 June 2024: Patient consents to use of AI scribe. The patient is a 83 year old female with the above listed medical problems referred by Dr. Michele for recommendation regarding her aortic valvular disease.  She was seen last she did report shortness of breath.  This is relatively unchanged versus previous.  She has no current symptoms such as shortness of breath, chest discomfort, or lightheadedness. She is able to perform daily activities without difficulty, including attending church three times a week and volunteering at a food bank. No significant slowing down in her activities has been noticed over the past few years.  Her past medical history includes breast cancer, for which she underwent a lumpectomy, radiation, and chemotherapy in 2005. She remains in remission, and her annual check-ups have shown no recurrence of cancer. She also has a history of cirrhosis, which was explained to her as not being related to alcohol use but possibly due to cholesterol deposits in the liver. She is not a drinker.  She was previously diagnosed with diabetes but no longer experiences symptoms. She manages her condition through diet, primarily consuming vegetables, fruits,  sweet tea, and water. Her glucose levels have been normal at recent check-ups.  In terms of her social history, she lives alone, drives, and maintains her household independently. She is actively involved in her church community, which she credits with providing her with a strong social network.  During the review of symptoms, she reports no lightheadedness when standing, no significant swelling in her legs except for neuropathy-related swelling in her foot, and no breathing difficulties when lying down. She does not sleep flat on her back.   She has upper and lower partial dentures.        Past Medical History:  Diagnosis Date   Aortic stenosis    Breast cancer (HCC)    Cancer (HCC)    Chest pain    Chronic kidney disease    Cirrhosis (HCC)    COPD (chronic obstructive pulmonary disease) (HCC)    DJD (degenerative joint disease)    GERD (gastroesophageal reflux disease)    Hyperlipidemia    Hypertension    Leg edema    Orthopnea    Palpitation    Personal history of radiation therapy    SOB (shortness of breath)    Vitamin D  deficiency     Past Surgical History:  Procedure Laterality Date   BREAST BIOPSY     BREAST LUMPECTOMY Left    CARDIAC CATHETERIZATION     CORONARY ANGIOPLASTY     LAPAROSCOPIC TOTAL HYSTERECTOMY     RIGHT/LEFT HEART CATH AND CORONARY ANGIOGRAPHY N/A 12/18/2020   Procedure: RIGHT/LEFT HEART CATH AND CORONARY ANGIOGRAPHY;  Surgeon: Elmira Newman PARAS, MD;  Location: [facility];  Service: Cardiovascular;  Laterality: N/A;  Family History  Problem Relation Age of Onset   Heart attack Mother    Stroke Mother    Cardiomyopathy Father     Social History   Socioeconomic History   Marital status: Widowed    Spouse name: Not on file   Number of children: 4   Years of education: Not on file   Highest education level: Not on file  Occupational History   Not on file  Tobacco Use   Smoking status: Former    Current packs/day: 0.00     Average packs/day: 0.5 packs/day for 42.6 years (21.3 ttl pk-yrs)    Types: Cigarettes    Start date: 02/12/1961    Quit date: 2005    Years since quitting: 20.7   Smokeless tobacco: Never  Vaping Use   Vaping status: Never Used  Substance and Sexual Activity   Alcohol use: Never   Drug use: Never   Sexual activity: Not on file  Other Topics Concern   Not on file  Social History Narrative   Not on file   Social Drivers of Health   Financial Resource Strain: Not on file  Food Insecurity: Not on file  Transportation Needs: Not on file  Physical Activity: Not on file  Stress: Not on file  Social Connections: Not on file  Intimate Partner Violence: Not on file     Prior to Admission medications   Medication Sig Start Date End Date Taking? Authorizing Provider  acetaminophen  (TYLENOL ) 500 MG tablet Take 500 mg by mouth every 6 (six) hours as needed.    [provider]  albuterol  (VENTOLIN  HFA) 108 (90 Base) MCG/ACT inhaler Inhale 2 puffs into the lungs every 4 (four) hours as needed for wheezing or shortness of breath. 03/17/21 06/13/24  Kara Dorn NOVAK, MD  Calcium  Polycarbophil (FIBER) 625 MG TABS 2 tablets as needed Orally Three times a day    [provider]  Cholecalciferol (D-3-5) 125 MCG (5000 UT) capsule Take 10,000 Units by mouth daily.    [provider]  CINNAMON PO Take by mouth.    [provider]  dimenhyDRINATE (DRAMAMINE) 50 MG tablet Take 50 mg by mouth every 8 (eight) hours as needed for nausea.    [provider]  Docusate Sodium (DSS) 100 MG CAPS 1 capsule as needed Orally Once a day for 30 day(s)    [provider]  losartan  (COZAAR ) 25 MG tablet Take 1 tablet (25 mg total) by mouth daily. 07/05/22 06/13/24  Tolia, Sunit, DO  magnesium  oxide (MAG-OX) 400 MG tablet Take 400 mg by mouth daily.    [provider]  melatonin 3 MG TABS tablet Take 6 mg by mouth at bedtime.    [provider]   metoprolol  succinate (TOPROL  XL) 25 MG 24 hr tablet Take 1 tablet (25 mg total) by mouth daily. 12/06/23   Tolia, Sunit, DO  Misc Natural Products (APPLE CIDER VINEGAR DIET PO) Take by mouth.    [provider]  Multiple Vitamins-Minerals (ONE-A-DAY WOMENS 50+ ADVANTAGE PO) Take 1 tablet by mouth daily.    [provider]  rosuvastatin  (CRESTOR ) 10 MG tablet TAKE 1 TABLET BY MOUTH EVERYDAY AT BEDTIME 06/09/21   Tolia, Sunit, DO  solifenacin (VESICARE) 10 MG tablet Take 10 mg by mouth daily. 06/23/22   [provider]  spironolactone  (ALDACTONE ) 25 MG tablet Take 1 tablet (25 mg total) by mouth every morning. 03/20/24   Tolia, Sunit, DO  traZODone (DESYREL) 50 MG tablet  Take 1 tablet by mouth at bedtime as needed.    [provider]    Allergies  Allergen Reactions   Cardizem  [Diltiazem ] Swelling    Lower leg edema    Empagliflozin  Other (See Comments)    REVIEW OF SYSTEMS:  General: no fevers/chills/night sweats Eyes: no blurry vision, diplopia, or amaurosis ENT: no sore throat or hearing loss Resp: no cough, wheezing, or hemoptysis CV: no edema or palpitations GI: no abdominal pain, nausea, vomiting, diarrhea, or constipation GU: no dysuria, frequency, or hematuria Skin: no rash Neuro: no headache, numbness, tingling, or weakness of extremities Musculoskeletal: no joint pain or swelling Heme: no bleeding, DVT, or easy bruising Endo: no polydipsia or polyuria  BP 134/64   Pulse 84   Ht 5' 2 (1.575 m)   Wt 177 lb 3.2 oz (80.4 kg)   SpO2 95%   BMI 32.41 kg/m   PHYSICAL EXAM: GEN:  AO x 3 in no acute distress HEENT: normal Dentition: Partial uppers and lowers Neck: JVP normal. +2 carotid upstrokes without bruits. No thyromegaly. Lungs: equal expansion, clear bilaterally CV: Apex is discrete and nondisplaced, RRR with 3/6 Abd: soft, non-tender, non-distended; no bruit; positive bowel sounds Ext: no edema, ecchymoses, or cyanosis Vascular:  2+ femoral pulses, 2+ radial pulses       Skin: warm and dry without rash Neuro: CN II-XII grossly intact; motor and sensory grossly intact    DATA AND STUDIES:  EKG:   EKG Interpretation Date/Time:  Tuesday June 19 2024 09:21:52 EDT Ventricular Rate:  87 PR Interval:  146 QRS Duration:  128 QT Interval:  380 QTC Calculation: 457 R Axis:   -16  Text Interpretation: Sinus rhythm with occasional Premature ventricular complexes Left ventricular hypertrophy with QRS widening ( R in aVL , Cornell product ) Cannot rule out Anteroseptal infarct (cited on or before 29-Nov-2023) When compared with ECG of 29-Nov-2023 13:52, QRS axis Shifted right Questionable change in initial forces of Anterior leads ST no longer elevated in Inferior leads T wave inversion now evident in Inferior leads Confirmed by Wendel Haws ([id number]) on 06/19/2024 9:23:33 AM        CARDIAC STUDIES: Refer to CV Procedures and Imaging Tabs  No results found for requested labs within last 365 days.   STS RISK CALCULATOR: Pending  NYHA CLASS: 1    ASSESSMENT AND PLAN:   1. Nonrheumatic aortic valve stenosis   2. Chronic heart failure with preserved ejection fraction (HFpEF) (HCC)   3. Malignant neoplasm of female breast, unspecified estrogen receptor status, unspecified laterality, unspecified site of breast (HCC)   4. Cirrhosis, nonalcoholic (HCC)   5. Hyperlipidemia LDL goal <70   6. Aortic atherosclerosis   7. Primary hypertension   8. Type 2 diabetes mellitus without complication, without long-term current use of insulin (HCC)   9. Pre-procedure lab exam     Aortic stenosis: Had a long conversation with the patient and her son.  She feels relatively well but she is concerned about the decrement in her LV function.  We also talked about the results of the early TAVR trial showing decreased risk for heart failure hospitalization without an associated mortality benefit.  After answering all of her questions  she has elected to pursue aortic valve intervention.  I refer the patient for right heart catheterization, coronary angiography study, CTA, and cardiothoracic surgical appointment. Chronic systolic heart failure: Continue Toprol  25 mg, losartan  25 mg, spironolactone  25 mg. History of breast cancer: In remission. Cirrhosis: LFTs  seem to be normal; no PT or INR assessed recently. Hyperlipidemia: Continue rosuvastatin  10 mg. Aortic atherosclerosis: Continue rosuvastatin  10 mg; consider aspirin  81 mg. Hypertension: Continue losartan  25 mg, Toprol  25 mg, spironolactone  25 mg.  BP is well-controlled today.   I have personally reviewed the patients imaging data as summarized above.  I have reviewed the natural history of aortic stenosis with the patient and family members who are present today. We have discussed the limitations of medical therapy and the poor prognosis associated with symptomatic aortic stenosis. We have also reviewed potential treatment options, including palliative medical therapy, conventional surgical aortic valve replacement, and transcatheter aortic valve replacement. We discussed treatment options in the context of this patient's specific comorbid medical conditions.   All of the patient's questions were answered today. Will make further recommendations based on the results of studies outlined above.   I spent 42 minutes reviewing all clinical data during and prior to this visit including all relevant imaging studies, laboratories, clinical information from other health systems and prior notes from both Cardiology and other specialties, interviewing the patient, conducting a complete physical examination, and coordinating care in order to formulate a comprehensive and personalized evaluation and treatment plan.   Jacques Fife K Karrine Kluttz, MD  06/19/2024 10:10 AM    [facility] [address] [place], [place], [place]  [address] Phone: [phone]; Fax: [phone]

## 2024-06-19 ENCOUNTER — Encounter: Payer: Self-pay | Admitting: Internal Medicine

## 2024-06-19 ENCOUNTER — Ambulatory Visit: Attending: Internal Medicine | Admitting: Internal Medicine

## 2024-06-19 VITALS — BP 134/64 | HR 84 | Ht 62.0 in | Wt 177.2 lb

## 2024-06-19 DIAGNOSIS — I5032 Chronic diastolic (congestive) heart failure: Secondary | ICD-10-CM | POA: Diagnosis not present

## 2024-06-19 DIAGNOSIS — Z01812 Encounter for preprocedural laboratory examination: Secondary | ICD-10-CM

## 2024-06-19 DIAGNOSIS — I350 Nonrheumatic aortic (valve) stenosis: Secondary | ICD-10-CM | POA: Diagnosis not present

## 2024-06-19 DIAGNOSIS — E119 Type 2 diabetes mellitus without complications: Secondary | ICD-10-CM

## 2024-06-19 DIAGNOSIS — I10 Essential (primary) hypertension: Secondary | ICD-10-CM

## 2024-06-19 DIAGNOSIS — E785 Hyperlipidemia, unspecified: Secondary | ICD-10-CM | POA: Diagnosis not present

## 2024-06-19 DIAGNOSIS — C50919 Malignant neoplasm of unspecified site of unspecified female breast: Secondary | ICD-10-CM | POA: Diagnosis not present

## 2024-06-19 DIAGNOSIS — I700 Atherosclerosis of aorta: Secondary | ICD-10-CM | POA: Diagnosis not present

## 2024-06-19 DIAGNOSIS — K7460 Unspecified cirrhosis of liver: Secondary | ICD-10-CM | POA: Diagnosis not present

## 2024-06-19 LAB — CBC

## 2024-06-19 NOTE — Patient Instructions (Signed)
 Medication Instructions:  No medication changes were made at this visit. Continue current regimen.   *If you need a refill on your cardiac medications before your next appointment, please call your pharmacy*  Lab Work: To be completed today: BMP and CBC  If you have labs (blood work) drawn today and your tests are completely normal, you will receive your results only by: MyChart Message (if you have MyChart) OR A paper copy in the mail If you have any lab test that is abnormal or we need to change your treatment, we will call you to review the results.  Testing/Procedures: Your physician has requested that you have a cardiac catheterization. Cardiac catheterization is used to diagnose and/or treat various heart conditions. Doctors may recommend this procedure for a number of different reasons. The most common reason is to evaluate chest pain. Chest pain can be a symptom of coronary artery disease (CAD), and cardiac catheterization can show whether plaque is narrowing or blocking your heart's arteries. This procedure is also used to evaluate the valves, as well as measure the blood flow and oxygen levels in different parts of your heart. For further information please visit [email or web address] Please follow instruction sheet, as given.   Follow-Up: At [facility], you and your health needs are our priority.  As part of our continuing mission to provide you with exceptional heart care, our providers are all part of one team.  This team includes your primary Cardiologist (physician) and Advanced Practice Providers or APPs (Physician Assistants and Nurse Practitioners) who all work together to provide you with the care you need, when you need it.  Your next appointment:   Per Structural Heart Team  Provider:   Lurena Red, MD or Izetta Hummer, PA-C    We recommend signing up for the patient portal called MyChart.  Sign up information is provided on this After Visit Summary.   MyChart is used to connect with patients for Virtual Visits (Telemedicine).  Patients are able to view lab/test results, encounter notes, upcoming appointments, etc.  Non-urgent messages can be sent to your provider as well.   To learn more about what you can do with MyChart, go to [email or web address].   Other Instructions       Cardiac/Peripheral Catheterization   You are scheduled for a Cardiac Catheterization on Monday, October 20 with Dr. Lurena Red.  1. Please arrive at the [facility] (Main Entrance A) at [facility]: [address] [place], [place] [address] at 8:30 AM (This time is 2 hour(s) before your procedure to ensure your preparation). Your procedure is scheduled to begin at 10:30 AM.  Free valet parking service is available. You will check in at ADMITTING. The support person will be asked to wait in the waiting room.  It is OK to have someone drop you off and come back when you are ready to be discharged.        Special note: Every effort is made to have your procedure done on time. Please understand that emergencies sometimes delay scheduled procedures.  2. Diet: Nothing to eat after midnight.  3. Hydration:You need to be well hydrated before your procedure. On October 20, you may drink approved liquids (see below) until 2 hours before the procedure, with 16 oz of water as your last intake.   List of approved liquids water, clear juice, clear tea, black coffee, fruit juices, non-citric and without pulp, carbonated beverages, Gatorade, Kool -Aid, plain Jello-O and plain ice popsicles.  4.  Labs: You will need to have blood drawn on Tuesday, October 14 at [facility] (1st Floor), [address], [place], [place] [address]. You do not need to be fasting.  5. Medication instructions in preparation for your procedure:   Contrast Allergy: No  Stop taking, Cozaar  (Losartan ) Sunday, October 19,, Spironolactone  Sunday,  October 19,   On the morning of your procedure, take Aspirin  81 mg and any morning medicines NOT listed above.  You may use sips of water.  6. Plan to go home the same day, you will only stay overnight if medically necessary. 7. You MUST have a responsible adult to drive you home. 8. An adult MUST be with you the first 24 hours after you arrive home. 9. Bring a current list of your medications, and the last time and date medication taken. 10. Bring ID and current insurance cards. 11.Please wear clothes that are easy to get on and off and wear slip-on shoes.  Thank you for allowing us  to care for you!   -- [facility]

## 2024-06-19 NOTE — Progress Notes (Signed)
 Pre Surgical Assessment: 5 M Walk Test  66M=16.54ft  5 Meter Walk Test- trial 1: 6.89 seconds 5 Meter Walk Test- trial 2: 6.54 seconds 5 Meter Walk Test- trial 3: 6.7 seconds 5 Meter Walk Test Average: 6.71 seconds

## 2024-06-20 ENCOUNTER — Ambulatory Visit: Payer: Self-pay | Admitting: Internal Medicine

## 2024-06-20 LAB — CBC
Hematocrit: 41.3 % (ref 34.0–46.6)
Hemoglobin: 14.1 g/dL (ref 11.1–15.9)
MCH: 33.4 pg — AB (ref 26.6–33.0)
MCHC: 34.1 g/dL (ref 31.5–35.7)
MCV: 98 fL — AB (ref 79–97)
Platelets: 106 x10E3/uL — AB (ref 150–450)
RBC: 4.22 x10E6/uL (ref 3.77–5.28)
RDW: 12.4 % (ref 11.7–15.4)
WBC: 4.2 x10E3/uL (ref 3.4–10.8)

## 2024-06-20 LAB — BASIC METABOLIC PANEL WITH GFR
BUN/Creatinine Ratio: 19 (ref 12–28)
BUN: 20 mg/dL (ref 8–27)
CO2: 22 mmol/L (ref 20–29)
Calcium: 10.1 mg/dL (ref 8.7–10.3)
Chloride: 103 mmol/L (ref 96–106)
Creatinine, Ser: 1.05 mg/dL — ABNORMAL HIGH (ref 0.57–1.00)
Glucose: 110 mg/dL — ABNORMAL HIGH (ref 70–99)
Potassium: 4.3 mmol/L (ref 3.5–5.2)
Sodium: 140 mmol/L (ref 134–144)
eGFR: 53 mL/min/1.73 — ABNORMAL LOW (ref >59)

## 2024-06-21 ENCOUNTER — Telehealth: Payer: Self-pay | Admitting: *Deleted

## 2024-06-21 NOTE — Telephone Encounter (Signed)
 Cardiac Catheterization scheduled at [facility] for: Monday June 25, 2024 10:30 AM Arrival time [facility] Main Entrance A at: 8:30 AM  Diet: -Nothing to eat after midnight.  Hydration: -May drink clear liquids until 2 hours before the procedure.  Approved liquids: Water, clear tea, black coffee, fruit juices-non-citric and without pulp,Gatorade, plain Jello/popsicles.   -Please drink 16 oz of water 2 hours before procedure.   Medication instructions: -Hold:  Spironolactone /Losartan -day before and day of procedure-per protocol GFR < 60 (53) -Other usual morning medications can be taken including aspirin  81 mg.  Plan to go home the same day, you will only stay overnight if medically necessary.  You must have responsible adult to drive you home.  Someone must be with you the first 24 hours after you arrive home.  Reviewed procedure instructions with patient.

## 2024-06-25 ENCOUNTER — Other Ambulatory Visit: Payer: Self-pay | Admitting: Physician Assistant

## 2024-06-25 ENCOUNTER — Other Ambulatory Visit: Payer: Self-pay

## 2024-06-25 ENCOUNTER — Ambulatory Visit (HOSPITAL_COMMUNITY)
Admission: RE | Admit: 2024-06-25 | Discharge: 2024-06-25 | Disposition: A | Discharge status: Home / Self Care | Attending: Internal Medicine | Admitting: Internal Medicine

## 2024-06-25 ENCOUNTER — Encounter (HOSPITAL_COMMUNITY)
Admission: RE | Disposition: A | Discharge status: Home / Self Care | Payer: Self-pay | Source: Home / Self Care | Attending: Internal Medicine

## 2024-06-25 ENCOUNTER — Encounter: Payer: Self-pay | Admitting: Physician Assistant

## 2024-06-25 DIAGNOSIS — I350 Nonrheumatic aortic (valve) stenosis: Secondary | ICD-10-CM

## 2024-06-25 DIAGNOSIS — K7460 Unspecified cirrhosis of liver: Secondary | ICD-10-CM | POA: Diagnosis not present

## 2024-06-25 DIAGNOSIS — N189 Chronic kidney disease, unspecified: Secondary | ICD-10-CM | POA: Diagnosis not present

## 2024-06-25 DIAGNOSIS — I130 Hypertensive heart and chronic kidney disease with heart failure and stage 1 through stage 4 chronic kidney disease, or unspecified chronic kidney disease: Secondary | ICD-10-CM | POA: Diagnosis not present

## 2024-06-25 DIAGNOSIS — I701 Atherosclerosis of renal artery: Secondary | ICD-10-CM | POA: Diagnosis not present

## 2024-06-25 DIAGNOSIS — I2510 Atherosclerotic heart disease of native coronary artery without angina pectoris: Secondary | ICD-10-CM | POA: Diagnosis not present

## 2024-06-25 DIAGNOSIS — E041 Nontoxic single thyroid nodule: Secondary | ICD-10-CM | POA: Insufficient documentation

## 2024-06-25 DIAGNOSIS — I5042 Chronic combined systolic (congestive) and diastolic (congestive) heart failure: Secondary | ICD-10-CM | POA: Diagnosis not present

## 2024-06-25 DIAGNOSIS — I428 Other cardiomyopathies: Secondary | ICD-10-CM | POA: Diagnosis not present

## 2024-06-25 DIAGNOSIS — E1122 Type 2 diabetes mellitus with diabetic chronic kidney disease: Secondary | ICD-10-CM | POA: Diagnosis not present

## 2024-06-25 DIAGNOSIS — E785 Hyperlipidemia, unspecified: Secondary | ICD-10-CM | POA: Diagnosis not present

## 2024-06-25 DIAGNOSIS — I700 Atherosclerosis of aorta: Secondary | ICD-10-CM | POA: Insufficient documentation

## 2024-06-25 DIAGNOSIS — Z7982 Long term (current) use of aspirin: Secondary | ICD-10-CM | POA: Diagnosis not present

## 2024-06-25 DIAGNOSIS — Z87891 Personal history of nicotine dependence: Secondary | ICD-10-CM | POA: Diagnosis not present

## 2024-06-25 DIAGNOSIS — Z79899 Other long term (current) drug therapy: Secondary | ICD-10-CM | POA: Insufficient documentation

## 2024-06-25 DIAGNOSIS — Z853 Personal history of malignant neoplasm of breast: Secondary | ICD-10-CM | POA: Insufficient documentation

## 2024-06-25 HISTORY — PX: RIGHT HEART CATH AND CORONARY ANGIOGRAPHY: CATH118264

## 2024-06-25 LAB — POCT I-STAT 7, (LYTES, BLD GAS, ICA,H+H)
Acid-base deficit: 2 mmol/L (ref 0.0–2.0)
Bicarbonate: 21.9 mmol/L (ref 20.0–28.0)
Calcium, Ion: 1.19 mmol/L (ref 1.15–1.40)
HCT: 35 % — ABNORMAL LOW (ref 36.0–46.0)
Hemoglobin: 11.9 g/dL — ABNORMAL LOW (ref 12.0–15.0)
O2 Saturation: 96 %
Potassium: 3.7 mmol/L (ref 3.5–5.1)
Sodium: 140 mmol/L (ref 135–145)
TCO2: 23 mmol/L (ref 22–32)
pCO2 arterial: 35.2 mmHg (ref 32–48)
pH, Arterial: 7.403 (ref 7.35–7.45)
pO2, Arterial: 82 mmHg — ABNORMAL LOW (ref 83–108)

## 2024-06-25 LAB — POCT I-STAT EG7
Acid-base deficit: 2 mmol/L (ref 0.0–2.0)
Acid-base deficit: 2 mmol/L (ref 0.0–2.0)
Bicarbonate: 22.9 mmol/L (ref 20.0–28.0)
Bicarbonate: 23.1 mmol/L (ref 20.0–28.0)
Calcium, Ion: 1.18 mmol/L (ref 1.15–1.40)
Calcium, Ion: 1.23 mmol/L (ref 1.15–1.40)
HCT: 35 % — ABNORMAL LOW (ref 36.0–46.0)
HCT: 36 % (ref 36.0–46.0)
Hemoglobin: 11.9 g/dL — ABNORMAL LOW (ref 12.0–15.0)
Hemoglobin: 12.2 g/dL (ref 12.0–15.0)
O2 Saturation: 77 %
O2 Saturation: 80 %
Potassium: 3.7 mmol/L (ref 3.5–5.1)
Potassium: 3.7 mmol/L (ref 3.5–5.1)
Sodium: 141 mmol/L (ref 135–145)
Sodium: 141 mmol/L (ref 135–145)
TCO2: 24 mmol/L (ref 22–32)
TCO2: 24 mmol/L (ref 22–32)
pCO2, Ven: 37.7 mmHg — ABNORMAL LOW (ref 44–60)
pCO2, Ven: 37.9 mmHg — ABNORMAL LOW (ref 44–60)
pH, Ven: 7.392 (ref 7.25–7.43)
pH, Ven: 7.393 (ref 7.25–7.43)
pO2, Ven: 42 mmHg (ref 32–45)
pO2, Ven: 45 mmHg (ref 32–45)

## 2024-06-25 SURGERY — RIGHT HEART CATH AND CORONARY ANGIOGRAPHY
Anesthesia: LOCAL

## 2024-06-25 MED ORDER — MIDAZOLAM HCL (PF) 2 MG/2ML IJ SOLN
INTRAMUSCULAR | Status: DC | PRN
  Administered 2024-06-25: 1 mg via INTRAVENOUS

## 2024-06-25 MED ORDER — ASPIRIN 81 MG PO CHEW: 81.0000 mg | CHEWABLE_TABLET | ORAL | Status: DC

## 2024-06-25 MED ORDER — FENTANYL CITRATE (PF) 100 MCG/2ML IJ SOLN
INTRAMUSCULAR | Status: AC
  Filled 2024-06-25: qty 2

## 2024-06-25 MED ORDER — SODIUM CHLORIDE 0.9 % IV SOLN: 250.0000 mL | INTRAVENOUS | Status: DC | PRN

## 2024-06-25 MED ORDER — HYDRALAZINE HCL 20 MG/ML IJ SOLN: 10.0000 mg | INTRAMUSCULAR | Status: DC | PRN

## 2024-06-25 MED ORDER — IOHEXOL 350 MG/ML SOLN
INTRAVENOUS | Status: DC | PRN
  Administered 2024-06-25: 50 mL

## 2024-06-25 MED ORDER — LIDOCAINE HCL (PF) 1 % IJ SOLN
INTRAMUSCULAR | Status: AC
  Filled 2024-06-25: qty 30

## 2024-06-25 MED ORDER — FENTANYL CITRATE (PF) 100 MCG/2ML IJ SOLN
INTRAMUSCULAR | Status: DC | PRN
  Administered 2024-06-25: 25 ug via INTRAVENOUS

## 2024-06-25 MED ORDER — HEPARIN SODIUM (PORCINE) 1000 UNIT/ML IJ SOLN
INTRAMUSCULAR | Status: AC
  Filled 2024-06-25: qty 10

## 2024-06-25 MED ORDER — HEPARIN (PORCINE) IN NACL 1000-0.9 UT/500ML-% IV SOLN
INTRAVENOUS | Status: DC | PRN
  Administered 2024-06-25 (×2): 500 mL

## 2024-06-25 MED ORDER — SODIUM CHLORIDE 0.9% FLUSH: 3.0000 mL | Freq: Two times a day (BID) | INTRAVENOUS | Status: DC

## 2024-06-25 MED ORDER — HEPARIN SODIUM (PORCINE) 1000 UNIT/ML IJ SOLN
INTRAMUSCULAR | Status: DC | PRN
  Administered 2024-06-25: 5000 [IU] via INTRAVENOUS

## 2024-06-25 MED ORDER — ACETAMINOPHEN 325 MG PO TABS: 650.0000 mg | ORAL_TABLET | ORAL | Status: DC | PRN

## 2024-06-25 MED ORDER — SODIUM CHLORIDE 0.9% FLUSH: 3.0000 mL | INTRAVENOUS | Status: DC | PRN

## 2024-06-25 MED ORDER — ONDANSETRON HCL 4 MG/2ML IJ SOLN: 4.0000 mg | Freq: Four times a day (QID) | INTRAMUSCULAR | Status: DC | PRN

## 2024-06-25 MED ORDER — MIDAZOLAM HCL 2 MG/2ML IJ SOLN
INTRAMUSCULAR | Status: AC
  Filled 2024-06-25: qty 2

## 2024-06-25 MED ORDER — VERAPAMIL HCL 2.5 MG/ML IV SOLN
INTRAVENOUS | Status: AC
  Filled 2024-06-25: qty 2

## 2024-06-25 MED ORDER — LABETALOL HCL 5 MG/ML IV SOLN: 10.0000 mg | INTRAVENOUS | Status: DC | PRN

## 2024-06-25 MED ORDER — LIDOCAINE HCL (PF) 1 % IJ SOLN
INTRAMUSCULAR | Status: DC | PRN
  Administered 2024-06-25: 5 mL via INTRADERMAL

## 2024-06-25 MED ORDER — FREE WATER: 250.0000 mL | Freq: Once | Status: DC

## 2024-06-25 MED ORDER — VERAPAMIL HCL 2.5 MG/ML IV SOLN
INTRAVENOUS | Status: DC | PRN
  Administered 2024-06-25: 10 mL via INTRA_ARTERIAL

## 2024-06-25 MED ORDER — FREE WATER: 500.0000 mL | Freq: Once | Status: DC

## 2024-06-25 SURGICAL SUPPLY — 12 items
CATH BALLN WEDGE 5F 110CM (CATHETERS) IMPLANT
CATH INFINITI 5FR ANG PIGTAIL (CATHETERS) IMPLANT
CATH INFINITI AMBI 6FR TG (CATHETERS) IMPLANT
DEVICE RAD COMP TR BAND LRG (VASCULAR PRODUCTS) IMPLANT
GLIDESHEATH SLEND A-KIT 6F 22G (SHEATH) IMPLANT
GLIDESHEATH SLEND SS 6F .021 (SHEATH) IMPLANT
GUIDEWIRE .025 260CM (WIRE) IMPLANT
PACK CARDIAC CATHETERIZATION (CUSTOM PROCEDURE TRAY) ×1 IMPLANT
SET ATX-X65L (MISCELLANEOUS) IMPLANT
SHEATH GLIDE SLENDER 4/5FR (SHEATH) IMPLANT
SHEATH PROBE COVER 6X72 (BAG) IMPLANT
WIRE EMERALD 3MM-J .035X260CM (WIRE) IMPLANT

## 2024-06-25 NOTE — Progress Notes (Signed)
 Upon arrival from the cath lab it was noted that the patient had swelling and tenderness on the arm below TR Band medial forearm area. Pressure was held for 15 minutes, pt was assisted from stretcher to chair arm was propped up on pillows and this nurse resumed holding pressure for an additional five minutes. Charge nurse was made aware, and evaluated pt arm. Nurse manager was called and it was determined that TR band needed to be repositioned. Nurse manager repositioned the band, reapplied 5 ml of air in the balloon, and notified the provider of pts status. MD evaluated the patients arm and determined no further interventions were needed. Began expelling air at 15 minute intervals 30 minutes after re application of TR Band. TR Band removed. No s/s of complications at incision site. Forearm remained soft and boggy. No s/s of hematoma present. PT did have bruising on the forearm. Discharge instructions were reviewed with patient and son Chaney at the bedside. Denies questions or concerns. PT ambulated to the bathroom was able to void without difficulty. PT denies n/v/ while here at the facility. IV was removed. PT escorted from the unit via wheelchair to personal vehicle.

## 2024-06-25 NOTE — Discharge Instructions (Signed)

## 2024-06-25 NOTE — Interval H&P Note (Signed)
 History and Physical Interval Note:  06/25/2024 8:13 AM  [name] [name]  has presented today for surgery, with the diagnosis of aortic stenosis.  The various methods of treatment have been discussed with the patient and family. After consideration of risks, benefits and other options for treatment, the patient has consented to  Procedure(s): RIGHT/LEFT HEART CATH AND CORONARY ANGIOGRAPHY (N/A) as a surgical intervention.  The patient's history has been reviewed, patient examined, no change in status, stable for surgery.  I have reviewed the patient's chart and labs.  Questions were answered to the patient's satisfaction.     Albina Gosney K [name]

## 2024-06-26 ENCOUNTER — Telehealth: Payer: Self-pay | Admitting: Cardiology

## 2024-06-26 ENCOUNTER — Encounter (HOSPITAL_COMMUNITY): Payer: Self-pay | Admitting: Internal Medicine

## 2024-06-26 DIAGNOSIS — I493 Ventricular premature depolarization: Secondary | ICD-10-CM

## 2024-06-26 NOTE — Telephone Encounter (Signed)
 Patient is requesting a call back to review her medications. Please advise.

## 2024-06-26 NOTE — Telephone Encounter (Signed)
 Pt called to report that on her med list that she received after getting the heart Cath, she has metoprolol  Succinate listed, but she has never taken it.   12/06/23- This medication was prescribed to the patient after she did not tolerate Cardizem , but she has never taken it. She reports that she has never taken it and is just wondering if she needs to be?   Informed her that I would ask her provider and we will call her back. She verbalized understanding.

## 2024-06-29 NOTE — Telephone Encounter (Signed)
 Recommend Toprol  XL 25mg  po qday.   Soren Pigman [place], DO, FACC

## 2024-07-02 MED ORDER — METOPROLOL SUCCINATE ER 25 MG PO TB24: 25.0000 mg | ORAL_TABLET | Freq: Every day | ORAL | 1 refills | Status: AC

## 2024-07-02 NOTE — Telephone Encounter (Signed)
 Spoke with patient. Advised that Dr. Michele recommended metoprolol  succinate 25mg  every day. Rx(s) sent to pharmacy electronically per request

## 2024-07-05 NOTE — Progress Notes (Signed)
 Procedure Type: Isolated AVR Perioperative Outcome Estimate % Operative Mortality 7.79% Morbidity & Mortality 16.4% Stroke 1.71% Renal Failure 4.72% Reoperation 5.71% Prolonged Ventilation 10.6% Deep Sternal Wound Infection 0.066% Long Hospital Stay (>14 days) 9.47% Short Hospital Stay (<6 days)* 22.9%

## 2024-07-06 ENCOUNTER — Ambulatory Visit (HOSPITAL_COMMUNITY)
Admission: RE | Admit: 2024-07-06 | Discharge: 2024-07-06 | Disposition: A | Discharge status: Home / Self Care | Source: Home / Self Care | Attending: Internal Medicine | Admitting: Internal Medicine

## 2024-07-06 DIAGNOSIS — I350 Nonrheumatic aortic (valve) stenosis: Secondary | ICD-10-CM | POA: Diagnosis not present

## 2024-07-06 MED ORDER — IOHEXOL 350 MG/ML SOLN
100.0000 mL | Freq: Once | INTRAVENOUS | Status: AC | PRN
  Administered 2024-07-06: 100 mL via INTRAVENOUS

## 2024-07-13 ENCOUNTER — Other Ambulatory Visit: Payer: Self-pay | Admitting: Physician Assistant

## 2024-07-13 ENCOUNTER — Ambulatory Visit: Payer: Self-pay | Admitting: Physician Assistant

## 2024-07-13 ENCOUNTER — Telehealth: Payer: Self-pay | Admitting: Cardiology

## 2024-07-13 DIAGNOSIS — K769 Liver disease, unspecified: Secondary | ICD-10-CM

## 2024-07-13 NOTE — Telephone Encounter (Signed)
 Spoke with MJ from radiology who is calling to confirm CT with over read has been received due to critical result.  Advised will forward to ordering provider to review.

## 2024-07-13 NOTE — Telephone Encounter (Signed)
 MJ with [facility] Radiology calling to report critical CT.

## 2024-07-16 ENCOUNTER — Ambulatory Visit (HOSPITAL_COMMUNITY)
Admission: RE | Admit: 2024-07-16 | Discharge: 2024-07-16 | Disposition: A | Discharge status: Home / Self Care | Source: Ambulatory Visit | Attending: Physician Assistant | Admitting: Physician Assistant

## 2024-07-16 DIAGNOSIS — K769 Liver disease, unspecified: Secondary | ICD-10-CM | POA: Diagnosis present

## 2024-07-16 MED ORDER — GADOBUTROL 1 MMOL/ML IV SOLN
8.0000 mL | Freq: Once | INTRAVENOUS | Status: AC | PRN
  Administered 2024-07-16: 8 mL via INTRAVENOUS

## 2024-07-18 ENCOUNTER — Encounter: Payer: Self-pay | Admitting: Surgery

## 2024-07-18 ENCOUNTER — Ambulatory Visit: Attending: Surgery | Admitting: Surgery

## 2024-07-18 VITALS — BP 131/71 | HR 73 | Resp 18 | Ht 62.0 in | Wt 184.0 lb

## 2024-07-18 DIAGNOSIS — I350 Nonrheumatic aortic (valve) stenosis: Secondary | ICD-10-CM

## 2024-07-18 NOTE — Progress Notes (Signed)
 Patient ID: [name] [name], female   DOB: 07-07-1941, 83 y.o.   MRN: [id number]   [facility]   MULTIDISCIPLINARY HEART VALVE CLINIC         CARDIOTHORACIC SURGERY CONSULTATION REPORT  PCP is Katina Pfeiffer, PA-C Referring Provider is Lurena Red, MD Primary Cardiologist is [name], DO  Reason for consultation:  Severe aortic stenosis  HPI:  The patient is an 83 year old woman with history of hypertension, hyperlipidemia, diet-controlled type 2 diabetes, nonalcoholic cirrhosis, breast cancer status post lumpectomy in 2005 followed by XRT and chemotherapy, and mild nonischemic cardiomyopathy felt to be secondary to severe aortic stenosis who was referred for consideration of TAVR.  The patient is here today with her son.  She reports having a few episodes of significant shortness of breath and dizziness with near syncope last year.  More recently she said that she feels fairly well and denies any shortness of breath, chest discomfort, or dizziness.  Her son said that he has noticed that she appears short of breath with any significant physical activity.  She said that she is tired most of the time and naps a few times a day.  She has noticed some swelling in her ankles.  Her main activity is attending church and volunteering at the food bank.  Her most recent echocardiogram on 01/20/2024 showed a calcified and thickened aortic valve with restricted leaflet mobility.  Mean gradient was 27 mmHg with a peak of 47 mmHg.  Aortic valve area by VTI was measured at 0.83 cm with a dimensionless index of 0.24 and a stroke-volume index of 38.  Left ventricular ejection fraction was 50% with mild LVH and grade 1 diastolic dysfunction.  Past Medical History:  Diagnosis Date   Aortic stenosis    Breast cancer (HCC)    Cancer (HCC)    Chest pain    Chronic kidney disease    Cirrhosis (HCC)    COPD (chronic obstructive pulmonary disease) (HCC)    DJD (degenerative joint disease)     GERD (gastroesophageal reflux disease)    Hyperlipidemia    Hypertension    Leg edema    Orthopnea    Palpitation    Personal history of radiation therapy    SOB (shortness of breath)    Vitamin D  deficiency     Past Surgical History:  Procedure Laterality Date   BREAST BIOPSY     BREAST LUMPECTOMY Left    CARDIAC CATHETERIZATION     CORONARY ANGIOPLASTY     LAPAROSCOPIC TOTAL HYSTERECTOMY     RIGHT HEART CATH AND CORONARY ANGIOGRAPHY N/A 06/25/2024   Procedure: RIGHT HEART CATH AND CORONARY ANGIOGRAPHY;  Surgeon: Thukkani, Arun K, MD;  Location: [facility];  Service: Cardiovascular;  Laterality: N/A;   RIGHT/LEFT HEART CATH AND CORONARY ANGIOGRAPHY N/A 12/18/2020   Procedure: RIGHT/LEFT HEART CATH AND CORONARY ANGIOGRAPHY;  Surgeon: Elmira Newman PARAS, MD;  Location: [facility];  Service: Cardiovascular;  Laterality: N/A;    Family History  Problem Relation Age of Onset   Heart attack Mother    Stroke Mother    Cardiomyopathy Father     Social History   Socioeconomic History   Marital status: Widowed    Spouse name: Not on file   Number of children: 4   Years of education: Not on file   Highest education level: Not on file  Occupational History   Not on file  Tobacco Use   Smoking status: Former  Current packs/day: 0.00    Average packs/day: 0.5 packs/day for 42.6 years (21.3 ttl pk-yrs)    Types: Cigarettes    Start date: 02/12/1961    Quit date: 2005    Years since quitting: 20.8   Smokeless tobacco: Never  Vaping Use   Vaping status: Never Used  Substance and Sexual Activity   Alcohol use: Never   Drug use: Never   Sexual activity: Not on file  Other Topics Concern   Not on file  Social History Narrative   Not on file   Social Drivers of Health   Financial Resource Strain: Not on file  Food Insecurity: Not on file  Transportation Needs: Not on file  Physical Activity: Not on file  Stress: Not on file  Social Connections: Not  on file  Intimate Partner Violence: Not on file    Prior to Admission medications   Medication Sig Start Date End Date Taking? Authorizing Provider  acetaminophen  (TYLENOL ) 500 MG tablet Take 500 mg by mouth every 6 (six) hours as needed.   Yes [provider]  albuterol  (VENTOLIN  HFA) 108 (90 Base) MCG/ACT inhaler Inhale 2 puffs into the lungs every 4 (four) hours as needed for wheezing or shortness of breath.   Yes [provider]  ascorbic acid (VITAMIN C) 500 MG tablet Take 500 mg by mouth daily.   Yes [provider]  aspirin  81 MG chewable tablet Chew 81 mg by mouth once.   Yes [provider]  Calcium  Polycarbophil (FIBER) 625 MG TABS Take 2 tablets by mouth 3 (three) times daily as needed (regularity).   Yes [provider]  Cholecalciferol (D-3-5) 125 MCG (5000 UT) capsule Take 10,000 Units by mouth daily.   Yes [provider]  Cinnamon 500 MG capsule Take 1,000 mg by mouth daily.   Yes [provider]  cyanocobalamin (VITAMIN B12) 1000 MCG tablet Take 1,000 mcg by mouth daily.   Yes [provider]  dimenhyDRINATE (DRAMAMINE) 50 MG tablet Take 50 mg by mouth every 8 (eight) hours as needed for nausea.   Yes [provider]  Docusate Sodium (DSS) 100 MG CAPS Take 200 mg by mouth daily.   Yes [provider]  losartan  (COZAAR ) 25 MG tablet Take 25 mg by mouth daily.   Yes [provider]  magnesium  oxide (MAG-OX) 400 MG tablet Take 400 mg by mouth daily.   Yes [provider]  melatonin 3 MG TABS tablet Take 3 mg by mouth at bedtime.   Yes [provider]  metoprolol  succinate (TOPROL  XL) 25 MG 24 hr tablet Take 1 tablet (25 mg total) by mouth daily. 07/02/24  Yes Tolia, Sunit, DO  Multiple Vitamins-Minerals (ONE-A-DAY WOMENS 50+ ADVANTAGE PO) Take 1 tablet by mouth daily.   Yes [provider]  rosuvastatin  (CRESTOR ) 10 MG tablet TAKE 1 TABLET BY MOUTH EVERYDAY  AT BEDTIME 06/09/21  Yes Tolia, Sunit, DO  solifenacin (VESICARE) 10 MG tablet Take 10 mg by mouth daily. 06/23/22  Yes [provider]  spironolactone  (ALDACTONE ) 25 MG tablet Take 1 tablet (25 mg total) by mouth every morning. 03/20/24  Yes Tolia, Sunit, DO  traZODone (DESYREL) 50 MG tablet Take 50 mg by mouth at bedtime as needed for sleep.   Yes [provider]  Turmeric Curcumin 500 MG CAPS Take 500 mg by mouth 2 (two) times daily.   Yes [provider]  zinc gluconate 50 MG tablet Take 50 mg by mouth daily.  Yes [provider]    Current Outpatient Medications  Medication Sig Dispense Refill   acetaminophen  (TYLENOL ) 500 MG tablet Take 500 mg by mouth every 6 (six) hours as needed.     albuterol  (VENTOLIN  HFA) 108 (90 Base) MCG/ACT inhaler Inhale 2 puffs into the lungs every 4 (four) hours as needed for wheezing or shortness of breath.     ascorbic acid (VITAMIN C) 500 MG tablet Take 500 mg by mouth daily.     aspirin  81 MG chewable tablet Chew 81 mg by mouth once.     Calcium  Polycarbophil (FIBER) 625 MG TABS Take 2 tablets by mouth 3 (three) times daily as needed (regularity).     Cholecalciferol (D-3-5) 125 MCG (5000 UT) capsule Take 10,000 Units by mouth daily.     Cinnamon 500 MG capsule Take 1,000 mg by mouth daily.     cyanocobalamin (VITAMIN B12) 1000 MCG tablet Take 1,000 mcg by mouth daily.     dimenhyDRINATE (DRAMAMINE) 50 MG tablet Take 50 mg by mouth every 8 (eight) hours as needed for nausea.     Docusate Sodium (DSS) 100 MG CAPS Take 200 mg by mouth daily.     losartan  (COZAAR ) 25 MG tablet Take 25 mg by mouth daily.     magnesium  oxide (MAG-OX) 400 MG tablet Take 400 mg by mouth daily.     melatonin 3 MG TABS tablet Take 3 mg by mouth at bedtime.     metoprolol  succinate (TOPROL  XL) 25 MG 24 hr tablet Take 1 tablet (25 mg total) by mouth daily. 90 tablet 1   Multiple Vitamins-Minerals (ONE-A-DAY WOMENS 50+ ADVANTAGE PO) Take 1 tablet  by mouth daily.     rosuvastatin  (CRESTOR ) 10 MG tablet TAKE 1 TABLET BY MOUTH EVERYDAY AT BEDTIME 90 tablet 1   solifenacin (VESICARE) 10 MG tablet Take 10 mg by mouth daily.     spironolactone  (ALDACTONE ) 25 MG tablet Take 1 tablet (25 mg total) by mouth every morning. 90 tablet 2   traZODone (DESYREL) 50 MG tablet Take 50 mg by mouth at bedtime as needed for sleep.     Turmeric Curcumin 500 MG CAPS Take 500 mg by mouth 2 (two) times daily.     zinc gluconate 50 MG tablet Take 50 mg by mouth daily.     No current facility-administered medications for this visit.    Allergies  Allergen Reactions   Cardizem  [Diltiazem ] Swelling    Lower leg edema    Jardiance  [Empagliflozin ] Other (See Comments)      Review of Systems:   General:  normal appetite, + decreased energy, no weight gain, no weight loss, no fever  Cardiac:  no chest pain with exertion, no chest pain at rest, + SOB with moderate exertion, no resting SOB, no PND, no orthopnea, no palpitations, no arrhythmia, no atrial fibrillation, + LE edema, + dizzy spells last year, no syncope  Respiratory:  + exertional shortness of breath, no home oxygen, no productive cough, no dry cough, no bronchitis, no wheezing, no hemoptysis, no asthma, no pain with inspiration or cough, no sleep apnea, no CPAP at night  GI:   no difficulty swallowing, no reflux, no frequent heartburn, no hiatal hernia, no abdominal pain, no constipation, no diarrhea, no hematochezia, no hematemesis, no melena  GU:   no dysuria,  no frequency, no urinary tract infection, no hematuria,   no kidney stones, + mild chronic kidney disease  Vascular:  no pain suggestive of claudication, no  pain in feet, no leg cramps, no varicose veins, no DVT, no non-healing foot ulcer  Neuro:   no stroke, no TIA's, no seizures, no headaches, no temporary blindness one eye,  no slurred speech, no peripheral neuropathy, no chronic pain, no instability of gait, no memory/cognitive  dysfunction  Musculoskeletal: no arthritis, no joint swelling, no myalgias, no difficulty walking, normal mobility   Skin:   no rash, no itching, no skin infections, no pressure sores or ulcerations  Psych:   no anxiety, no depression, no nervousness, no unusual recent stress  Eyes:   no blurry vision, no floaters, no recent vision changes, + wears glasses   ENT:   no hearing loss, no loose or painful teeth, partial dentures  Hematologic:  no easy bruising, no abnormal bleeding, no clotting disorder, no frequent epistaxis  Endocrine:  +  diabetes, does not check CBG's at home     Physical Exam:   BP 131/71 (BP Location: Right Arm)   Pulse 73   Resp 18   Ht 5' 2 (1.575 m)   Wt 184 lb (83.5 kg)   SpO2 90%   BMI 33.65 kg/m   General:  Elderly, well-appearing  HEENT:  Unremarkable, NCAT, PERLA, EOMI  Neck:   no JVD, no bruits, no adenopathy   Chest:   clear to auscultation, symmetrical breath sounds, no wheezes, no rhonchi   CV:   RRR, 3/6 systolic murmur RSB, no diastolic murmur  Abdomen:  soft, non-tender, no masses   Extremities:  warm, well-perfused, pedal pulses palpable, no lower extremity edema  Rectal/GU  Deferred  Neuro:   Grossly non-focal and symmetrical throughout  Skin:   Clean and dry, no rashes, no breakdown  Diagnostic Tests:  ECHOCARDIOGRAM REPORT       Patient Name:   [name] Date of Exam: 01/20/2024  Medical Rec #:  [id number]        Height:       62.0 in  Accession #:    [id number]       Weight:       179.0 lb  Date of Birth:  03-12-1941        BSA:          1.824 m  Patient Age:    82 years         BP:           143/84 mmHg  Patient Gender: F                HR:           82 bpm.  Exam Location:  [address]   Procedure: 2D Echo, Cardiac Doppler and Color Doppler (Both Spectral and  Color            Flow Doppler were utilized during procedure).   Indications:    I35.0 Aortic stenosis    History:        Patient has prior history of  Echocardiogram examinations,  most                 recent 01/26/2023. CHF and HFpEF, History of breast cancer  status                 post XRT and chemotherapy s/p left mastectomy and COPD,                  Signs/Symptoms:3/6 RUSB Harsh midsystolic and Dyspnea;  Risk  Factors:Former Smoker, Dyslipidemia, Hypertension and  Sleep                 Apnea. Previous echo LVEF 60% calculated 53% moderate AS  mean                 gradient 13.4 mmHg.    Sonographer:    Nolon Berg [facility], RDCS  Referring Phys: [id number] [name]   IMPRESSIONS     1. Left ventricular ejection fraction, by estimation, is 50%. The left  ventricle has low normal function. The left ventricle has no regional wall  motion abnormalities. There is mild asymmetric left ventricular  hypertrophy of the infero-lateral segment.  Left ventricular diastolic parameters are consistent with Grade I  diastolic dysfunction (impaired relaxation).   2. Right ventricular systolic function is mildly reduced. The right  ventricular size is normal. Tricuspid regurgitation signal is inadequate  for assessing PA pressure.   3. The mitral valve is degenerative. Trivial mitral valve regurgitation.  No evidence of mitral stenosis.   4. The tricuspid valve is degenerative, mild TR. Appears eccentric, may  be underestimated.   5. The aortic valve is abnormal. There is severe calcifcation of the  aortic valve. Aortic valve regurgitation is not visualized. Moderate  aortic valve stenosis. Aortic valve area, by VTI measures 0.83 cm. Aortic  valve mean gradient measures 27.0 mmHg.  Aortic valve Vmax measures 3.43 m/s.   6. The inferior vena cava is normal in size with <50% respiratory  variability, suggesting right atrial pressure of 8 mmHg.   FINDINGS   Left Ventricle: Left ventricular ejection fraction, by estimation, is  50%. The left ventricle has low normal function. The left ventricle has no  regional wall motion  abnormalities. The left ventricular internal cavity  size was normal in size. There is  mild asymmetric left ventricular hypertrophy of the infero-lateral  segment. Left ventricular diastolic parameters are consistent with Grade I  diastolic dysfunction (impaired relaxation).   Right Ventricle: The right ventricular size is normal. No increase in  right ventricular wall thickness. Right ventricular systolic function is  mildly reduced. Tricuspid regurgitation signal is inadequate for assessing  PA pressure.   Left Atrium: Left atrial size was normal in size.   Right Atrium: Right atrial size was normal in size.   Pericardium: There is no evidence of pericardial effusion.   Mitral Valve: The mitral valve is degenerative in appearance. Trivial  mitral valve regurgitation. No evidence of mitral valve stenosis.   Tricuspid Valve: The tricuspid valve is degenerative in appearance.  Tricuspid valve regurgitation is mild . No evidence of tricuspid stenosis.   Aortic Valve: The aortic valve is abnormal. There is severe calcifcation  of the aortic valve. Aortic valve regurgitation is not visualized.  Moderate aortic stenosis is present. Aortic valve mean gradient measures  27.0 mmHg. Aortic valve peak gradient  measures 47.1 mmHg. Aortic valve area, by VTI measures 0.83 cm.   Pulmonic Valve: The pulmonic valve was normal in structure. Pulmonic valve  regurgitation is trivial. No evidence of pulmonic stenosis.   Aorta: The aortic root is normal in size and structure.   Venous: The inferior vena cava is normal in size with less than 50%  respiratory variability, suggesting right atrial pressure of 8 mmHg.   IAS/Shunts: The atrial septum is grossly normal.     LEFT VENTRICLE  PLAX 2D  LVIDd:         4.50 cm   Diastology  LVIDs:  3.50 cm   LV e' medial:    5.55 cm/s  LV PW:         1.00 cm   LV E/e' medial:  16.1  LV IVS:        0.80 cm   LV e' lateral:   7.94 cm/s  LVOT  diam:     2.10 cm   LV E/e' lateral: 11.2  LV SV:         70  LV SV Index:   38  LVOT Area:     3.46 cm     RIGHT VENTRICLE             IVC  RV Basal diam:  3.50 cm     IVC diam: 1.50 cm  RV Mid diam:    3.00 cm  RV S prime:     10.00 cm/s  TAPSE (M-mode): 1.8 cm   LEFT ATRIUM             Index        RIGHT ATRIUM          Index  LA diam:        3.80 cm 2.08 cm/m   RA Area:     9.06 cm  LA Vol (A2C):   55.1 ml 30.21 ml/m  RA Volume:   19.10 ml 10.47 ml/m  LA Vol (A4C):   52.6 ml 28.84 ml/m  LA Biplane Vol: 52.9 ml 29.01 ml/m   AORTIC VALVE  AV Area (Vmax):    0.88 cm  AV Area (Vmean):   0.92 cm  AV Area (VTI):     0.83 cm  AV Vmax:           343.00 cm/s  AV Vmean:          229.200 cm/s  AV VTI:            0.843 m  AV Peak Grad:      47.1 mmHg  AV Mean Grad:      27.0 mmHg  LVOT Vmax:         87.50 cm/s  LVOT Vmean:        60.800 cm/s  LVOT VTI:          0.202 m  LVOT/AV VTI ratio: 0.24    AORTA  Ao Root diam: 2.90 cm  Ao Asc diam:  3.10 cm   MITRAL VALVE  MV Area (PHT): 3.77 cm    SHUNTS  MV Decel Time: 201 msec    Systemic VTI:  0.20 m  MV E velocity: 89.10 cm/s  Systemic Diam: 2.10 cm  MV A velocity: 99.00 cm/s  MV E/A ratio:  0.90   Soyla Merck MD  Electronically signed by Soyla Merck MD  Signature Date/Time: 01/21/2024/11:30:27 AM        Final     Procedures  RIGHT HEART CATH AND CORONARY ANGIOGRAPHY   Conclusion  1.  Normal right dominant circulation. 2.  Fick cardiac output of 8.0 L/min and Fick cardiac index of 4.4 L/min/m with the following hemodynamics:            Right atrial pressure mean of 10 mmHg            Right ventricular pressure 35/4 with end-diastolic pressure of 10 mmHg            Wedge pressure mean of 16 mmHg with V waves to 20 mmHg  PA pressure 28/19 with a mean of 25 mmHg 3.  Capacious iliofemoral vessels bilaterally.  If TAVR is to be pursued then the right femoral approach should be used as the  femoral bifurcation is at the mid femoral head on the left side.   Recommendation: Continue evaluation for aortic valve intervention.   Procedural Details  Technical Details The patient is an 83 year old female with a history of severe symptomatic aortic stenosis, nonischemic cardiomyopathy, type 2 diabetes mellitus, hypertension, and hyperlipidemia who was seen in the outpatient setting due to ongoing dyspnea.  She is referred for coronary angiography and right heart catheterization as part of her preprocedure workup.  After obtaining consent, the patient was brought to the cardiac catheterization laboratory and prepped draped sterile fashion.  Xylocaine  was used to anesthetize the right wrist and the 6 French Terumo glide sheath was placed.  5000 units heparin  and 5 mg of verapamil  were administered to the sheath.  Previously placed antecubital IV was exchanged for a 5 French Terumo glide sheath.  Right heart catheterization was performed with a 5 French balloontipped catheter.  A 6 French TIG catheter was used for selective coronary angiography.  A 5 French pigtail catheter was maneuvered to the distal abdominal aorta and aortography and bilateral iliofemoral angiography was performed.  After review of the angiographic and hemodynamic data, no further interventions were pursued.  A TR band was placed and manual pressure applied to the antecubital site.  There were no acute complications. Estimated blood loss <50 mL.   During this procedure medications were administered to achieve and maintain moderate conscious sedation while the patient's heart rate, blood pressure, and oxygen saturation were continuously monitored and I was present face-to-face 100% of this time. Erica Clinard Cardiovascular Specialist and Nicole Zafiris Cardiovascular Specialist are independent, trained observers who assisted in the monitoring of the patient's level of consciousness.   Medications (Filter: Administrations  occurring from [id number] to 1019 on 06/25/24) Heparin  (Porcine) in NaCl 1000-0.9 UT/500ML-% SOLN (mL)  Total volume: 1,000 mL Date/Time Rate/Dose/Volume Action   06/25/24 0940 500 mL Given   0940 500 mL Given   midazolam  PF (VERSED ) injection (mg)  Total dose: 1 mg Date/Time Rate/Dose/Volume Action   06/25/24 0942 1 mg Given   fentaNYL  (SUBLIMAZE ) injection (mcg)  Total dose: 25 mcg Date/Time Rate/Dose/Volume Action   06/25/24 0942 25 mcg Given   lidocaine  (PF) (XYLOCAINE ) 1 % injection (mL)  Total volume: 5 mL Date/Time Rate/Dose/Volume Action   06/25/24 0953 5 mL Given   Radial Cocktail/Verapamil  only (mL)  Total volume: 10 mL Date/Time Rate/Dose/Volume Action   06/25/24 0956 10 mL Given   heparin  sodium (porcine) injection (Units)  Total dose: 5,000 Units Date/Time Rate/Dose/Volume Action   06/25/24 0956 5,000 Units Given   iohexol  (OMNIPAQUE ) 350 MG/ML injection (mL)  Total volume: 50 mL Date/Time Rate/Dose/Volume Action   06/25/24 1012 50 mL Given    Sedation Time  Sedation Time Physician-1: 27 minutes 2 seconds Contrast     Administrations occurring from 0928 to 1019 on 06/25/24:  Medication Name Total Dose  iohexol  (OMNIPAQUE ) 350 MG/ML injection 50 mL   Radiation/Fluoro  Fluoro time: 3.8 (min) DAP: 7.9 (Gycm2) Cumulative Air Kerma: 96.1 (mGy) Complications  Complications documented before study signed (06/25/2024 10:22 AM)   No complications were associated with this study.  Documented by Zafiris, Nicole E, RN - 06/25/2024 10:10 AM     Coronary Findings  Diagnostic Dominance: Right Left Main  Vessel is normal in caliber. Vessel  is angiographically normal.    Left Anterior Descending  Vessel is normal in caliber. Vessel is angiographically normal.    Left Circumflex  Vessel is normal in caliber. Vessel is angiographically normal.    Right Coronary Artery  Vessel is normal in caliber. Vessel is angiographically normal.    Intervention   No  interventions have been documented.   Coronary Diagrams  Diagnostic Dominance: Right  Intervention   Implants   No implant documentation for this case.   Syngo Images   Show images for CARDIAC CATHETERIZATION Images on Long Term Storage   Show images for [place], [place] to Procedure Log  Procedure Log    Hemo Data  Flowsheet Row Most Recent Value  Fick Cardiac Output 8.04 L/min  Fick Cardiac Output Index 4.37 (L/min)/BSA  RA A Wave 13 mmHg  RA V Wave 10 mmHg  RA Mean 10 mmHg  RV Systolic Pressure 35 mmHg  RV Diastolic Pressure 4 mmHg  RV EDP 10 mmHg  PA Systolic Pressure 28 mmHg  PA Diastolic Pressure 19 mmHg  PA Mean 25 mmHg  PW A Wave 19 mmHg  PW V Wave 20 mmHg  PW Mean 16 mmHg  AO Systolic Pressure 120 mmHg  AO Diastolic Pressure 65 mmHg  AO Mean 92 mmHg  QP/QS 0.84  TPVR Index 6.8 HRUI  TSVR Index 21.08 HRUI  PVR SVR Ratio 0.13  TPVR/TSVR Ratio 0.32   ADDENDUM REPORT: 07/13/2024 11:59   CLINICAL DATA:  This over-read does not include interpretation of cardiac or coronary anatomy or pathology. The cardiac CTA interpretation by the cardiologist is attached.   COMPARISON:  None available.   FINDINGS: No suspicious nodules, masses, or infiltrates are identified in the visualized portion of the lungs. No pleural fluid seen.   Hepatic cirrhosis is seen. Esophageal varices are noted consistent with portal venous hypertension. A focal ill-defined area of increased enhancement is seen in segment 2 of the left lobe which could represent hepatic mass; hepatocellular carcinoma cannot be excluded.   IMPRESSION: Hepatic cirrhosis and findings of portal venous hypertension.   Possible left hepatic lobe mass; hepatocellular carcinoma cannot be excluded. Abdomen MRI without and with contrast is recommended for further evaluation.   These results will be called to the ordering clinician or representative by the Radiologist Assistant, and  communication documented in the PACS or [employer].     Electronically Signed   By: Norleen DELENA Kil M.D.   On: 07/13/2024 11:59    Addended by Kil Norleen, MD on 07/13/2024 12:02 PM    Study Result  Narrative & Impression  CLINICAL DATA:  14F with severe aortic stenosis being evaluated for a TAVR procedure.   EXAM: Cardiac TAVR CT   TECHNIQUE: A non-contrast, gated CT scan was obtained with axial slices of 2.5 mm through the heart for aortic valve scoring. A 120 kV retrospective, gated, contrast cardiac scan was obtained. Gantry rotation speed was 230 msec and collimation was 0.63 mm. Nitroglycerin was not given. A delayed scan was obtained to exclude left atrial appendage thrombus. The 3D dataset was reconstructed in systole with motion correction. The 3D data set was reconstructed in 5% intervals of the 0-95% of the R-R cycle. Systolic and diastolic phases were analyzed on a dedicated workstation using MPR, MIP, and VRT modes. The patient received 100 cc of contrast.   FINDINGS: Aortic Root:   Aortic valve: trileaflet   Aortic valve calcium  score: 1714   Aortic annulus:   Diameter:  27mm x 22mm   Perimeter: 76mm   Area: 466mm^2   Calcifications: Mild calcification adjacent to left cusp   Coronary height: Min Left - 12mm; Min Right - 11mm   Sinotubular height: Left cusp - 22mm; Right cusp - 17mm; Noncoronary cusp - 24mm   LVOT (as measured 3 mm below the annulus):   Diameter: 29mm x 22mm   Area: 472 mm^2   Calcifications: Mild calcification inferior to left cusp   Aortic sinus width: Left cusp - 28mm; Right cusp - 29mm; Noncoronary cusp - 31mm   Sinotubular junction width: 29mm x 28mm   Optimum Fluoroscopic Angle for Delivery: RAO 1 CAU 4   Cardiac:   Right atrium: Normal size   Right ventricle: Normal size   Pulmonary arteries: Normal size   Pulmonary veins: Normal configuration   Left atrium: Mild enlargement   Left ventricle: Normal  size   Pericardium: Normal thickness   Coronary arteries: Coronary calcium  score 1410 (94th percentile)   IMPRESSION: 1. Tricuspid aortic valve with severe calcifications (AV calcium  score 1714)   2. Aortic annulus measures 27mm x 22mm in diameter with perimeter 76mm and area 451 mm^2. Mild annular calcifications adjacent to left coronary cusp and extending into LVOT. Annular measurements are suitable for delivery of 26mm Edwards Sapien 3 valve   3. Borderline low coronary height for both RCA (11mm) and left main (12mm)   4. Optimum Fluoroscopic Angle for Delivery:  RAO 1 CAU 4   5. Coronary calcium  score 1410 (94th percentile)   Electronically Signed: By: Lonni Nanas M.D. On: 07/09/2024 16:29      Narrative & Impression  CLINICAL DATA:  Aortic valve replacement, preoperative evaluation   EXAM: CTA ABDOMEN AND PELVIS WITHOUT AND WITH CONTRAST   TECHNIQUE: Multidetector CT imaging of the abdomen and pelvis was performed using the standard protocol during bolus administration of intravenous contrast. Multiplanar reconstructed images and MIPs were obtained and reviewed to evaluate the vascular anatomy.   RADIATION DOSE REDUCTION: This exam was performed according to the departmental dose-optimization program which includes automated exposure control, adjustment of the mA and/or kV according to patient size and/or use of iterative reconstruction technique.   CONTRAST:  OMNIPAQUE  IOHEXOL  350 MG/ML SOLN   COMPARISON:  CT of the abdomen performed May 24 2006   FINDINGS: VASCULAR   Aorta: Patent with diffuse calcific atherosclerosis. Minimal diameter of the infrarenal segment measures 7.9 mm.   Celiac: Mild stenosis in the proximal segment secondary to calcific atherosclerosis.   SMA: Patent.   Renals: Single right renal artery with moderate to severe stenosis in the proximal segment. Single left renal artery with mild stenosis in the  proximal segment.   IMA: Patent.   Inflow: Mild plaque with minimal diameter in the right common iliac artery measuring 7 mm. The right internal iliac artery is patent. The right external iliac artery is patent with minimal diameter measuring 7 mm.   The left common iliac artery is patent with mild plaque and minimal diameter estimated at 7.8 mm. Left internal iliac artery is patent. The left external iliac artery is patent with minimal diameter measuring 7 mm.   Proximal Outflow: Right common femoral artery is patent with minimal diameter measuring 7 mm. Left common femoral artery is patent with minimal diameter measuring 6.8 mm.   Veins: A recanalized umbilical vein is apparent. Esophageal varices.   Review of the MIP images confirms the above findings.   NON-VASCULAR   Lower chest: Reported  separately.   Hepatobiliary: The liver demonstrates a nodular surface contour with hypertrophy of the caudate. Gallstones are present within the gallbladder. Two liver cysts are favored with the largest measuring 1.6 cm.   Pancreas: Unremarkable. No pancreatic ductal dilatation or surrounding inflammatory changes.   Spleen: Within normal limits for size.   Adrenals/Urinary Tract: Adrenal glands are unremarkable. Kidneys are normal, without renal calculi, focal lesion, or hydronephrosis. Bladder is unremarkable.   Stomach/Bowel: No dilated loops of bowel are appreciated.   Lymphatic: No evidence of significant lymphadenopathy.   Reproductive: Status post hysterectomy. No adnexal masses.   Other: Fat containing umbilical hernia.   Musculoskeletal: Degenerative changes in the lumbar spine.   IMPRESSION: 1. Imaging features of cirrhosis and portal hypertension with esophageal varices. 2. Minimal vessel diameters detailed above. 3. Moderate to severe right renal artery stenosis.     Electronically Signed   By: Maude Naegeli M.D.   On: 07/07/2024 09:49       Impression:  This 83 year old woman has stage D, severe, symptomatic low-flow/low gradient aortic stenosis with NYHA class II symptoms of exertional fatigue and shortness of breath consistent with chronic diastolic congestive heart failure.  I have personally reviewed her 2D echocardiogram, cardiac catheterization, and CTA studies.  Her echo in May 2025 showed a severely calcified and thickened aortic valve with a mean gradient of 27 mmHg with a valve area of 0.83 cm and dimensionless index of 0.24 consistent with severe aortic stenosis.  Stroke-volume index was on the low side.  Left ventricular ejection fraction was 50%.  Cardiac catheterization showed normal coronary arteries.  I agree that aortic valve replacement is indicated in this patient for relief of her symptoms and to prevent progressive left ventricular dysfunction.  Given her advanced age and cirrhosis with portal hypertension and varices I think transcatheter aortic valve replacement would be the best option for treating her.  Her gated cardiac CTA shows anatomy suitable for TAVR using a 26 mm SAPIEN 3 valve.  Her abdominal and pelvic CTA shows adequate pelvic vascular anatomy to allow transfemoral insertion.  Her gated cardiac CTA showed a possible left hepatic lobe mass although was not mentioned on her CTA of the abdomen and pelvis.  It is an area with ill-defined increased enhancement in segment 2 of the left lobe.  She was scheduled for an MRI of the abdomen which was performed on 07/16/2024 but it could not be completed due to problems with the magnet.  This has been rescheduled.   The patient and her son were counseled at length regarding treatment alternatives for management of severe symptomatic aortic stenosis. The risks and benefits of surgical intervention has been discussed in detail. Long-term prognosis with medical therapy was discussed. Alternative approaches such as conventional surgical aortic valve replacement,  transcatheter aortic valve replacement, and palliative medical therapy were compared and contrasted at length. This discussion was placed in the context of the patient's own specific clinical presentation and past medical history. All of their questions have been addressed.   Following the decision to proceed with transcatheter aortic valve replacement, a discussion was held regarding what types of management strategies would be attempted intraoperatively in the event of life-threatening complications, including whether or not the patient would be considered a candidate for the use of cardiopulmonary bypass and/or conversion to open sternotomy for attempted surgical intervention.  She is 83 years old with evidence of cirrhosis on CT scan with portal hypertension and esophageal varices.  I do not think  she would be a candidate for emergent sternotomy to manage any intraoperative complications.  He patient has been advised of a variety of complications that might develop including but not limited to risks of death, stroke, paravalvular leak, aortic dissection or other major vascular complications, aortic annulus rupture, device embolization, cardiac rupture or perforation, mitral regurgitation, acute myocardial infarction, arrhythmia, heart block or bradycardia requiring permanent pacemaker placement, congestive heart failure, respiratory failure, renal failure, pneumonia, infection, other late complications related to structural valve deterioration or migration, or other complications that might ultimately cause a temporary or permanent loss of functional independence or other long term morbidity. The patient provides full informed consent for the procedure as described and all questions were answered.      Plan:  She will tentatively be scheduled for transcatheter aortic valve replacement using a SAPIEN 3 valve pending the results of her MRI of the liver.  I spent 60 minutes performing this consultation  and > 50% of this time was spent face to face counseling and coordinating the care of this patient's severe symptomatic aortic stenosis.   Dorise LOIS Fellers, MD 07/18/2024

## 2024-07-19 ENCOUNTER — Ambulatory Visit: Payer: Self-pay

## 2024-07-19 ENCOUNTER — Other Ambulatory Visit: Payer: Self-pay

## 2024-07-19 DIAGNOSIS — I350 Nonrheumatic aortic (valve) stenosis: Secondary | ICD-10-CM

## 2024-08-01 ENCOUNTER — Other Ambulatory Visit: Payer: Self-pay | Admitting: Cardiology

## 2024-08-01 DIAGNOSIS — R0609 Other forms of dyspnea: Secondary | ICD-10-CM

## 2024-08-01 DIAGNOSIS — I5032 Chronic diastolic (congestive) heart failure: Secondary | ICD-10-CM

## 2024-08-10 ENCOUNTER — Inpatient Hospital Stay (HOSPITAL_COMMUNITY)
Admission: RE | Admit: 2024-08-10 | Discharge: 2024-08-10 | Disposition: A | Source: Ambulatory Visit | Attending: Internal Medicine

## 2024-08-10 ENCOUNTER — Other Ambulatory Visit: Payer: Self-pay

## 2024-08-10 ENCOUNTER — Ambulatory Visit (HOSPITAL_COMMUNITY)
Admission: RE | Admit: 2024-08-10 | Discharge: 2024-08-10 | Disposition: A | Source: Ambulatory Visit | Attending: Internal Medicine

## 2024-08-10 DIAGNOSIS — I350 Nonrheumatic aortic (valve) stenosis: Secondary | ICD-10-CM

## 2024-08-10 DIAGNOSIS — Z01818 Encounter for other preprocedural examination: Secondary | ICD-10-CM

## 2024-08-10 HISTORY — DX: Thrombocytopenia, unspecified: D69.6

## 2024-08-10 LAB — COMPREHENSIVE METABOLIC PANEL WITH GFR
ALT: 18 U/L (ref 0–44)
AST: 32 U/L (ref 15–41)
Albumin: 3.7 g/dL (ref 3.5–5.0)
Alkaline Phosphatase: 40 U/L (ref 38–126)
Anion gap: 3 — ABNORMAL LOW (ref 5–15)
BUN: 19 mg/dL (ref 8–23)
CO2: 28 mmol/L (ref 22–32)
Calcium: 9.3 mg/dL (ref 8.9–10.3)
Chloride: 107 mmol/L (ref 98–111)
Creatinine, Ser: 1.01 mg/dL — ABNORMAL HIGH (ref 0.44–1.00)
GFR, Estimated: 56 mL/min — ABNORMAL LOW (ref >60)
Glucose, Bld: 128 mg/dL — ABNORMAL HIGH (ref 70–99)
Potassium: 3.6 mmol/L (ref 3.5–5.1)
Sodium: 138 mmol/L (ref 135–145)
Total Bilirubin: 1.5 mg/dL — ABNORMAL HIGH (ref 0.0–1.2)
Total Protein: 6.6 g/dL (ref 6.5–8.1)

## 2024-08-10 LAB — CBC
HCT: 41 % (ref 36.0–46.0)
Hemoglobin: 13.7 g/dL (ref 12.0–15.0)
MCH: 32.9 pg (ref 26.0–34.0)
MCHC: 33.4 g/dL (ref 30.0–36.0)
MCV: 98.6 fL (ref 80.0–100.0)
Platelets: 99 K/uL — ABNORMAL LOW (ref 150–400)
RBC: 4.16 MIL/uL (ref 3.87–5.11)
RDW: 12.7 % (ref 11.5–15.5)
WBC: 4.2 K/uL (ref 4.0–10.5)
nRBC: 0 % (ref 0.0–0.2)

## 2024-08-10 LAB — URINALYSIS, ROUTINE W REFLEX MICROSCOPIC
Bilirubin Urine: NEGATIVE
Glucose, UA: NEGATIVE mg/dL
Hgb urine dipstick: NEGATIVE
Ketones, ur: NEGATIVE mg/dL
Leukocytes,Ua: NEGATIVE
Nitrite: NEGATIVE
Protein, ur: NEGATIVE mg/dL
Specific Gravity, Urine: >1.03 — ABNORMAL HIGH (ref 1.005–1.030)
pH: 6 (ref 5.0–8.0)

## 2024-08-10 LAB — TYPE AND SCREEN
ABO/RH(D): A POS
Antibody Screen: NEGATIVE

## 2024-08-10 LAB — PROTIME-INR
INR: 1.2 (ref 0.8–1.2)
Prothrombin Time: 16 s — ABNORMAL HIGH (ref 11.4–15.2)

## 2024-08-10 LAB — SURGICAL PCR SCREEN
MRSA, PCR: NEGATIVE
Staphylococcus aureus: NEGATIVE

## 2024-08-10 NOTE — Progress Notes (Signed)
 All consents signed by patient at PAT lab appointment. Pt was sent home with printed copy of surgical instructions and CHG soap/CHG soap instructions. All instructions reviewed with patient and questions answered.  Patients chart send to anesthesia for review. Pt denies any respiratory illness/infection in the last two months.

## 2024-08-13 ENCOUNTER — Encounter (HOSPITAL_COMMUNITY): Payer: Self-pay

## 2024-08-13 MED ORDER — HEPARIN 30,000 UNITS/1000 ML (OHS) CELLSAVER SOLUTION
Status: DC
  Filled 2024-08-13: qty 1000

## 2024-08-13 MED ORDER — MAGNESIUM SULFATE 50 % IJ SOLN
40.0000 meq | INTRAMUSCULAR | Status: DC
  Filled 2024-08-13: qty 9.85

## 2024-08-13 MED ORDER — NOREPINEPHRINE 4 MG/250ML-% IV SOLN
0.0000 ug/min | INTRAVENOUS | Status: AC
  Administered 2024-08-14: 2 ug/min via INTRAVENOUS
  Filled 2024-08-13: qty 250

## 2024-08-13 MED ORDER — POTASSIUM CHLORIDE 2 MEQ/ML IV SOLN
80.0000 meq | INTRAVENOUS | Status: DC
  Filled 2024-08-13: qty 40

## 2024-08-13 MED ORDER — DEXMEDETOMIDINE HCL IN NACL 400 MCG/100ML IV SOLN
0.1000 ug/kg/h | INTRAVENOUS | Status: AC
  Administered 2024-08-14: .7 ug/kg/h via INTRAVENOUS
  Administered 2024-08-14: 41.76 ug via INTRAVENOUS
  Filled 2024-08-13: qty 100

## 2024-08-13 MED ORDER — CEFAZOLIN SODIUM-DEXTROSE 2-4 GM/100ML-% IV SOLN
2.0000 g | INTRAVENOUS | Status: AC
  Administered 2024-08-14: 2 g via INTRAVENOUS
  Filled 2024-08-13: qty 100

## 2024-08-13 NOTE — H&P (Signed)
 Patient ID: [name] [name], female   DOB: Jan 21, 1941, 83 y.o.   MRN: [id number]    [facility]   MULTIDISCIPLINARY HEART VALVE CLINIC           CARDIOTHORACIC SURGERY ADMISSION HISTORY AND PHYSICAL   PCP is [name] Pfeiffer, PA-C Referring Provider is [name] Red, MD Primary Cardiologist is [name] Large, DO   Reason for admission:  Severe aortic stenosis   HPI:   The patient is an 83 year old woman with history of hypertension, hyperlipidemia, diet-controlled type 2 diabetes, nonalcoholic cirrhosis, breast cancer status post lumpectomy in 2005 followed by XRT and chemotherapy, and mild nonischemic cardiomyopathy felt to be secondary to severe aortic stenosis who was referred for consideration of TAVR.  The patient is here today with her son.  She reports having a few episodes of significant shortness of breath and dizziness with near syncope last year.  More recently she said that she feels fairly well and denies any shortness of breath, chest discomfort, or dizziness.  Her son said that he has noticed that she appears short of breath with any significant physical activity.  She said that she is tired most of the time and naps a few times a day.  She has noticed some swelling in her ankles.  Her main activity is attending church and volunteering at the food bank.   Her most recent echocardiogram on 01/20/2024 showed a calcified and thickened aortic valve with restricted leaflet mobility.  Mean gradient was 27 mmHg with a peak of 47 mmHg.  Aortic valve area by VTI was measured at 0.83 cm with a dimensionless index of 0.24 and a stroke-volume index of 38.  Left ventricular ejection fraction was 50% with mild LVH and grade 1 diastolic dysfunction.       Past Medical History:  Diagnosis Date   Aortic stenosis     Breast cancer (HCC)     Cancer (HCC)     Chest pain     Chronic kidney disease     Cirrhosis (HCC)     COPD (chronic obstructive pulmonary disease) (HCC)     DJD  (degenerative joint disease)     GERD (gastroesophageal reflux disease)     Hyperlipidemia     Hypertension     Leg edema     Orthopnea     Palpitation     Personal history of radiation therapy     SOB (shortness of breath)     Vitamin D  deficiency                 Past Surgical History:  Procedure Laterality Date   BREAST BIOPSY       BREAST LUMPECTOMY Left     CARDIAC CATHETERIZATION       CORONARY ANGIOPLASTY       LAPAROSCOPIC TOTAL HYSTERECTOMY       RIGHT HEART CATH AND CORONARY ANGIOGRAPHY N/A 06/25/2024    Procedure: RIGHT HEART CATH AND CORONARY ANGIOGRAPHY;  Surgeon: Thukkani, Arun K, MD;  Location: [facility];  Service: Cardiovascular;  Laterality: N/A;   RIGHT/LEFT HEART CATH AND CORONARY ANGIOGRAPHY N/A 12/18/2020    Procedure: RIGHT/LEFT HEART CATH AND CORONARY ANGIOGRAPHY;  Surgeon: [name] Newman PARAS, MD;  Location: [facility];  Service: Cardiovascular;  Laterality: N/A;               Family History  Problem Relation Age of Onset   Heart attack Mother     Stroke Mother  Cardiomyopathy Father            Social History         Socioeconomic History   Marital status: Widowed      Spouse name: Not on file   Number of children: 4   Years of education: Not on file   Highest education level: Not on file  Occupational History   Not on file  Tobacco Use   Smoking status: Former      Current packs/day: 0.00      Average packs/day: 0.5 packs/day for 42.6 years (21.3 ttl pk-yrs)      Types: Cigarettes      Start date: 02/12/1961      Quit date: 2005      Years since quitting: 20.8   Smokeless tobacco: Never  Vaping Use   Vaping status: Never Used  Substance and Sexual Activity   Alcohol use: Never   Drug use: Never   Sexual activity: Not on file  Other Topics Concern   Not on file  Social History Narrative   Not on file    Social Drivers of Health    Financial Resource Strain: Not on file  Food Insecurity: Not on file   Transportation Needs: Not on file  Physical Activity: Not on file  Stress: Not on file  Social Connections: Not on file  Intimate Partner Violence: Not on file             Prior to Admission medications   Medication Sig Start Date End Date Taking? Authorizing Provider  acetaminophen  (TYLENOL ) 500 MG tablet Take 500 mg by mouth every 6 (six) hours as needed.     Yes [provider]  albuterol  (VENTOLIN  HFA) 108 (90 Base) MCG/ACT inhaler Inhale 2 puffs into the lungs every 4 (four) hours as needed for wheezing or shortness of breath.     Yes [provider]  ascorbic acid (VITAMIN C) 500 MG tablet Take 500 mg by mouth daily.     Yes [provider]  aspirin  81 MG chewable tablet Chew 81 mg by mouth once.     Yes [provider]  Calcium  Polycarbophil (FIBER) 625 MG TABS Take 2 tablets by mouth 3 (three) times daily as needed (regularity).     Yes [provider]  Cholecalciferol (D-3-5) 125 MCG (5000 UT) capsule Take 10,000 Units by mouth daily.     Yes [provider]  Cinnamon 500 MG capsule Take 1,000 mg by mouth daily.     Yes [provider]  cyanocobalamin (VITAMIN B12) 1000 MCG tablet Take 1,000 mcg by mouth daily.     Yes [provider]  dimenhyDRINATE (DRAMAMINE) 50 MG tablet Take 50 mg by mouth every 8 (eight) hours as needed for nausea.     Yes [provider]  Docusate Sodium (DSS) 100 MG CAPS Take 200 mg by mouth daily.     Yes [provider]  losartan  (COZAAR ) 25 MG tablet Take 25 mg by mouth daily.     Yes [provider]  magnesium  oxide (MAG-OX) 400 MG tablet Take 400 mg by mouth daily.     Yes [provider]  melatonin 3 MG TABS tablet Take 3 mg by mouth at bedtime.     Yes [provider]  metoprolol  succinate (TOPROL  XL) 25 MG 24 hr tablet Take 1 tablet (25 mg total) by mouth daily. 07/02/24   Yes [name], Sunit, DO  Multiple Vitamins-Minerals (ONE-A-DAY  WOMENS  50+ ADVANTAGE PO) Take 1 tablet by mouth daily.     Yes [provider]  rosuvastatin  (CRESTOR ) 10 MG tablet TAKE 1 TABLET BY MOUTH EVERYDAY AT BEDTIME 06/09/21   Yes [name], Sunit, DO  solifenacin (VESICARE) 10 MG tablet Take 10 mg by mouth daily. 06/23/22   Yes [provider]  spironolactone  (ALDACTONE ) 25 MG tablet Take 1 tablet (25 mg total) by mouth every morning. 03/20/24   Yes [name], Sunit, DO  traZODone  (DESYREL ) 50 MG tablet Take 50 mg by mouth at bedtime as needed for sleep.     Yes [provider]  Turmeric Curcumin 500 MG CAPS Take 500 mg by mouth 2 (two) times daily.     Yes [provider]  zinc gluconate 50 MG tablet Take 50 mg by mouth daily.     Yes [provider]            Current Outpatient Medications  Medication Sig Dispense Refill   acetaminophen  (TYLENOL ) 500 MG tablet Take 500 mg by mouth every 6 (six) hours as needed.       albuterol  (VENTOLIN  HFA) 108 (90 Base) MCG/ACT inhaler Inhale 2 puffs into the lungs every 4 (four) hours as needed for wheezing or shortness of breath.       ascorbic acid (VITAMIN C) 500 MG tablet Take 500 mg by mouth daily.       aspirin  81 MG chewable tablet Chew 81 mg by mouth once.       Calcium  Polycarbophil (FIBER) 625 MG TABS Take 2 tablets by mouth 3 (three) times daily as needed (regularity).       Cholecalciferol (D-3-5) 125 MCG (5000 UT) capsule Take 10,000 Units by mouth daily.       Cinnamon 500 MG capsule Take 1,000 mg by mouth daily.       cyanocobalamin (VITAMIN B12) 1000 MCG tablet Take 1,000 mcg by mouth daily.       dimenhyDRINATE (DRAMAMINE) 50 MG tablet Take 50 mg by mouth every 8 (eight) hours as needed for nausea.       Docusate Sodium (DSS) 100 MG CAPS Take 200 mg by mouth daily.       losartan  (COZAAR ) 25 MG tablet Take 25 mg by mouth daily.       magnesium  oxide (MAG-OX) 400 MG tablet Take 400 mg by mouth daily.       melatonin 3 MG TABS tablet Take 3 mg by mouth at  bedtime.       metoprolol  succinate (TOPROL  XL) 25 MG 24 hr tablet Take 1 tablet (25 mg total) by mouth daily. 90 tablet 1   Multiple Vitamins-Minerals (ONE-A-DAY WOMENS 50+ ADVANTAGE PO) Take 1 tablet by mouth daily.       rosuvastatin  (CRESTOR ) 10 MG tablet TAKE 1 TABLET BY MOUTH EVERYDAY AT BEDTIME 90 tablet 1   solifenacin (VESICARE) 10 MG tablet Take 10 mg by mouth daily.       spironolactone  (ALDACTONE ) 25 MG tablet Take 1 tablet (25 mg total) by mouth every morning. 90 tablet 2   traZODone  (DESYREL ) 50 MG tablet Take 50 mg by mouth at bedtime as needed for sleep.       Turmeric Curcumin 500 MG CAPS Take 500 mg by mouth 2 (two) times daily.       zinc gluconate 50 MG tablet Take 50 mg by mouth daily.          No current facility-administered medications for this visit.  Allergies       Allergies  Allergen Reactions   Cardizem  [Diltiazem ] Swelling      Lower leg edema    Jardiance  [Empagliflozin ] Other (See Comments)            Review of Systems:               General:                      normal appetite, + decreased energy, no weight gain, no weight loss, no fever             Cardiac:                       no chest pain with exertion, no chest pain at rest, + SOB with moderate exertion, no resting SOB, no PND, no orthopnea, no palpitations, no arrhythmia, no atrial fibrillation, + LE edema, + dizzy spells last year, no syncope             Respiratory:                 + exertional shortness of breath, no home oxygen, no productive cough, no dry cough, no bronchitis, no wheezing, no hemoptysis, no asthma, no pain with inspiration or cough, no sleep apnea, no CPAP at night             GI:                               no difficulty swallowing, no reflux, no frequent heartburn, no hiatal hernia, no abdominal pain, no constipation, no diarrhea, no hematochezia, no hematemesis, no melena             GU:                              no dysuria,  no frequency, no urinary tract  infection, no hematuria,   no kidney stones, + mild chronic kidney disease             Vascular:                     no pain suggestive of claudication, no pain in feet, no leg cramps, no varicose veins, no DVT, no non-healing foot ulcer             Neuro:                         no stroke, no TIA's, no seizures, no headaches, no temporary blindness one eye,  no slurred speech, no peripheral neuropathy, no chronic pain, no instability of gait, no memory/cognitive dysfunction             Musculoskeletal:         no arthritis, no joint swelling, no myalgias, no difficulty walking, normal mobility              Skin:                            no rash, no itching, no skin infections, no pressure sores or ulcerations             Psych:  no anxiety, no depression, no nervousness, no unusual recent stress             Eyes:                           no blurry vision, no floaters, no recent vision changes, + wears glasses              ENT:                            no hearing loss, no loose or painful teeth, partial dentures             Hematologic:               no easy bruising, no abnormal bleeding, no clotting disorder, no frequent epistaxis             Endocrine:                   +  diabetes, does not check CBG's at home                            Physical Exam:               BP 131/71 (BP Location: Right Arm)   Pulse 73   Resp 18   Ht 5' 2 (1.575 m)   Wt 184 lb (83.5 kg)   SpO2 90%   BMI 33.65 kg/m              General:                      Elderly, well-appearing             HEENT:                       Unremarkable, NCAT, PERLA, EOMI             Neck:                           no JVD, no bruits, no adenopathy              Chest:                          clear to auscultation, symmetrical breath sounds, no wheezes, no rhonchi              CV:                              RRR, 3/6 systolic murmur RSB, no diastolic murmur             Abdomen:                    soft,  non-tender, no masses              Extremities:                 warm, well-perfused, pedal pulses palpable, no lower extremity edema             Rectal/GU                   Deferred  Neuro:                         Grossly non-focal and symmetrical throughout             Skin:                            Clean and dry, no rashes, no breakdown   Diagnostic Tests:   ECHOCARDIOGRAM REPORT       Patient Name:   [name] Date of Exam: 01/20/2024  Medical Rec #:  [id number]        Height:       62.0 in  Accession #:    [id number]       Weight:       179.0 lb  Date of Birth:  20-Aug-1941        BSA:          1.824 m  Patient Age:    82 years         BP:           143/84 mmHg  Patient Gender: F                HR:           82 bpm.  Exam Location:  [address]   Procedure: 2D Echo, Cardiac Doppler and Color Doppler (Both Spectral and  Color            Flow Doppler were utilized during procedure).   Indications:    I35.0 Aortic stenosis    History:        Patient has prior history of Echocardiogram examinations,  most                 recent 01/26/2023. CHF and HFpEF, History of breast cancer  status                 post XRT and chemotherapy s/p left mastectomy and COPD,                  Signs/Symptoms:3/6 RUSB Harsh midsystolic and Dyspnea;  Risk                 Factors:Former Smoker, Dyslipidemia, Hypertension and  Sleep                 Apnea. Previous echo LVEF 60% calculated 53% moderate AS  mean                 gradient 13.4 mmHg.    Sonographer:    Nolon Berg [facility], RDCS  Referring Phys: [id number] [name] [name]   IMPRESSIONS     1. Left ventricular ejection fraction, by estimation, is 50%. The left  ventricle has low normal function. The left ventricle has no regional wall  motion abnormalities. There is mild asymmetric left ventricular  hypertrophy of the infero-lateral segment.  Left ventricular diastolic parameters are consistent with Grade I  diastolic  dysfunction (impaired relaxation).   2. Right ventricular systolic function is mildly reduced. The right  ventricular size is normal. Tricuspid regurgitation signal is inadequate  for assessing PA pressure.   3. The mitral valve is degenerative. Trivial mitral valve regurgitation.  No evidence of mitral stenosis.   4. The tricuspid valve is degenerative, mild TR. Appears eccentric, may  be underestimated.   5. The aortic valve is abnormal. There is severe calcifcation of the  aortic valve. Aortic valve regurgitation is not visualized. Moderate  aortic valve stenosis. Aortic valve area, by VTI measures 0.83 cm. Aortic  valve mean gradient measures 27.0 mmHg.  Aortic valve Vmax measures 3.43 m/s.   6. The inferior vena cava is normal in size with <50% respiratory  variability, suggesting right atrial pressure of 8 mmHg.   FINDINGS   Left Ventricle: Left ventricular ejection fraction, by estimation, is  50%. The left ventricle has low normal function. The left ventricle has no  regional wall motion abnormalities. The left ventricular internal cavity  size was normal in size. There is  mild asymmetric left ventricular hypertrophy of the infero-lateral  segment. Left ventricular diastolic parameters are consistent with Grade I  diastolic dysfunction (impaired relaxation).   Right Ventricle: The right ventricular size is normal. No increase in  right ventricular wall thickness. Right ventricular systolic function is  mildly reduced. Tricuspid regurgitation signal is inadequate for assessing  PA pressure.   Left Atrium: Left atrial size was normal in size.   Right Atrium: Right atrial size was normal in size.   Pericardium: There is no evidence of pericardial effusion.   Mitral Valve: The mitral valve is degenerative in appearance. Trivial  mitral valve regurgitation. No evidence of mitral valve stenosis.   Tricuspid Valve: The tricuspid valve is degenerative in appearance.   Tricuspid valve regurgitation is mild . No evidence of tricuspid stenosis.   Aortic Valve: The aortic valve is abnormal. There is severe calcifcation  of the aortic valve. Aortic valve regurgitation is not visualized.  Moderate aortic stenosis is present. Aortic valve mean gradient measures  27.0 mmHg. Aortic valve peak gradient  measures 47.1 mmHg. Aortic valve area, by VTI measures 0.83 cm.   Pulmonic Valve: The pulmonic valve was normal in structure. Pulmonic valve  regurgitation is trivial. No evidence of pulmonic stenosis.   Aorta: The aortic root is normal in size and structure.   Venous: The inferior vena cava is normal in size with less than 50%  respiratory variability, suggesting right atrial pressure of 8 mmHg.   IAS/Shunts: The atrial septum is grossly normal.     LEFT VENTRICLE  PLAX 2D  LVIDd:         4.50 cm   Diastology  LVIDs:         3.50 cm   LV e' medial:    5.55 cm/s  LV PW:         1.00 cm   LV E/e' medial:  16.1  LV IVS:        0.80 cm   LV e' lateral:   7.94 cm/s  LVOT diam:     2.10 cm   LV E/e' lateral: 11.2  LV SV:         70  LV SV Index:   38  LVOT Area:     3.46 cm     RIGHT VENTRICLE             IVC  RV Basal diam:  3.50 cm     IVC diam: 1.50 cm  RV Mid diam:    3.00 cm  RV S prime:     10.00 cm/s  TAPSE (M-mode): 1.8 cm   LEFT ATRIUM             Index        RIGHT ATRIUM          Index  LA diam:  3.80 cm 2.08 cm/m   RA Area:     9.06 cm  LA Vol (A2C):   55.1 ml 30.21 ml/m  RA Volume:   19.10 ml 10.47 ml/m  LA Vol (A4C):   52.6 ml 28.84 ml/m  LA Biplane Vol: 52.9 ml 29.01 ml/m   AORTIC VALVE  AV Area (Vmax):    0.88 cm  AV Area (Vmean):   0.92 cm  AV Area (VTI):     0.83 cm  AV Vmax:           343.00 cm/s  AV Vmean:          229.200 cm/s  AV VTI:            0.843 m  AV Peak Grad:      47.1 mmHg  AV Mean Grad:      27.0 mmHg  LVOT Vmax:         87.50 cm/s  LVOT Vmean:        60.800 cm/s  LVOT VTI:          0.202 m   LVOT/AV VTI ratio: 0.24    AORTA  Ao Root diam: 2.90 cm  Ao Asc diam:  3.10 cm   MITRAL VALVE  MV Area (PHT): 3.77 cm    SHUNTS  MV Decel Time: 201 msec    Systemic VTI:  0.20 m  MV E velocity: 89.10 cm/s  Systemic Diam: 2.10 cm  MV A velocity: 99.00 cm/s  MV E/A ratio:  0.90   Soyla Merck MD  Electronically signed by Soyla Merck MD  Signature Date/Time: 01/21/2024/11:30:27 AM        Final      Procedures   RIGHT HEART CATH AND CORONARY ANGIOGRAPHY    Conclusion   1.  Normal right dominant circulation. 2.  Fick cardiac output of 8.0 L/min and Fick cardiac index of 4.4 L/min/m with the following hemodynamics:            Right atrial pressure mean of 10 mmHg            Right ventricular pressure 35/4 with end-diastolic pressure of 10 mmHg            Wedge pressure mean of 16 mmHg with V waves to 20 mmHg            PA pressure 28/19 with a mean of 25 mmHg 3.  Capacious iliofemoral vessels bilaterally.  If TAVR is to be pursued then the right femoral approach should be used as the femoral bifurcation is at the mid femoral head on the left side.   Recommendation: Continue evaluation for aortic valve intervention.   Procedural Details   Technical Details The patient is an 83 year old female with a history of severe symptomatic aortic stenosis, nonischemic cardiomyopathy, type 2 diabetes mellitus, hypertension, and hyperlipidemia who was seen in the outpatient setting due to ongoing dyspnea.  She is referred for coronary angiography and right heart catheterization as part of her preprocedure workup.  After obtaining consent, the patient was brought to the cardiac catheterization laboratory and prepped draped sterile fashion.  Xylocaine  was used to anesthetize the right wrist and the 6 French Terumo glide sheath was placed.  5000 units heparin  and 5 mg of verapamil  were administered to the sheath.  Previously placed antecubital IV was exchanged for a 5 French Terumo glide  sheath.  Right heart catheterization was performed with a 5 French balloontipped catheter.  A 6 French TIG catheter  was used for selective coronary angiography.  A 5 French pigtail catheter was maneuvered to the distal abdominal aorta and aortography and bilateral iliofemoral angiography was performed.  After review of the angiographic and hemodynamic data, no further interventions were pursued.  A TR band was placed and manual pressure applied to the antecubital site.  There were no acute complications. Estimated blood loss <50 mL.   During this procedure medications were administered to achieve and maintain moderate conscious sedation while the patient's heart rate, blood pressure, and oxygen saturation were continuously monitored and I was present face-to-face 100% of this time. Erica Clinard Cardiovascular Specialist and Nicole Zafiris Cardiovascular Specialist are independent, trained observers who assisted in the monitoring of the patient's level of consciousness.    Medications (Filter: Administrations occurring from [phone] to 1019 on 06/25/24) Heparin  (Porcine) in NaCl 1000-0.9 UT/500ML-% SOLN (mL)  Total volume: 1,000 mL Date/Time Rate/Dose/Volume Action    06/25/24 0940 500 mL Given    0940 500 mL Given    midazolam  PF (VERSED ) injection (mg)  Total dose: 1 mg Date/Time Rate/Dose/Volume Action    06/25/24 0942 1 mg Given    fentaNYL  (SUBLIMAZE ) injection (mcg)  Total dose: 25 mcg Date/Time Rate/Dose/Volume Action    06/25/24 0942 25 mcg Given    lidocaine  (PF) (XYLOCAINE ) 1 % injection (mL)  Total volume: 5 mL Date/Time Rate/Dose/Volume Action    06/25/24 0953 5 mL Given    Radial Cocktail/Verapamil  only (mL)  Total volume: 10 mL Date/Time Rate/Dose/Volume Action    06/25/24 0956 10 mL Given    heparin  sodium (porcine) injection (Units)  Total dose: 5,000 Units Date/Time Rate/Dose/Volume Action    06/25/24 0956 5,000 Units Given    iohexol  (OMNIPAQUE ) 350 MG/ML injection  (mL)  Total volume: 50 mL Date/Time Rate/Dose/Volume Action    06/25/24 1012 50 mL Given      Sedation Time   Sedation Time Physician-1: 27 minutes 2 seconds Contrast        Administrations occurring from 0928 to 1019 on 06/25/24:  Medication Name Total Dose  iohexol  (OMNIPAQUE ) 350 MG/ML injection 50 mL    Radiation/Fluoro   Fluoro time: 3.8 (min) DAP: 7.9 (Gycm2) Cumulative Air Kerma: 96.1 (mGy) Complications   Complications documented before study signed (06/25/2024 10:22 AM)    No complications were associated with this study.  Documented by Zafiris, Nicole E, RN - 06/25/2024 10:10 AM      Coronary Findings   Diagnostic Dominance: Right Left Main  Vessel is normal in caliber. Vessel is angiographically normal.    Left Anterior Descending  Vessel is normal in caliber. Vessel is angiographically normal.    Left Circumflex  Vessel is normal in caliber. Vessel is angiographically normal.    Right Coronary Artery  Vessel is normal in caliber. Vessel is angiographically normal.    Intervention    No interventions have been documented.    Coronary Diagrams   Diagnostic Dominance: Right  Intervention    Implants    No implant documentation for this case.    [employer] Images    Show images for CARDIAC CATHETERIZATION Images on [employer]    Show images for [place], [place] to Procedure Log   Procedure Log    Hemo Data   Flowsheet Row Most Recent Value  Fick Cardiac Output 8.04 L/min  Fick Cardiac Output Index 4.37 (L/min)/BSA  RA A Wave 13 mmHg  RA V Wave 10 mmHg  RA Mean 10 mmHg  RV Systolic  Pressure 35 mmHg  RV Diastolic Pressure 4 mmHg  RV EDP 10 mmHg  PA Systolic Pressure 28 mmHg  PA Diastolic Pressure 19 mmHg  PA Mean 25 mmHg  PW A Wave 19 mmHg  PW V Wave 20 mmHg  PW Mean 16 mmHg  AO Systolic Pressure 120 mmHg  AO Diastolic Pressure 65 mmHg  AO Mean 92 mmHg  QP/QS 0.84  TPVR Index 6.8 HRUI  TSVR Index 21.08 HRUI  PVR  SVR Ratio 0.13  TPVR/TSVR Ratio 0.32    ADDENDUM REPORT: 07/13/2024 11:59   CLINICAL DATA:  This over-read does not include interpretation of cardiac or coronary anatomy or pathology. The cardiac CTA interpretation by the cardiologist is attached.   COMPARISON:  None available.   FINDINGS: No suspicious nodules, masses, or infiltrates are identified in the visualized portion of the lungs. No pleural fluid seen.   Hepatic cirrhosis is seen. Esophageal varices are noted consistent with portal venous hypertension. A focal ill-defined area of increased enhancement is seen in segment 2 of the left lobe which could represent hepatic mass; hepatocellular carcinoma cannot be excluded.   IMPRESSION: Hepatic cirrhosis and findings of portal venous hypertension.   Possible left hepatic lobe mass; hepatocellular carcinoma cannot be excluded. Abdomen MRI without and with contrast is recommended for further evaluation.   These results will be called to the ordering clinician or representative by the Radiologist Assistant, and communication documented in the PACS or [employer].     Electronically Signed   By: Norleen DELENA Kil M.D.   On: 07/13/2024 11:59    Addended by Kil Norleen, MD on 07/13/2024 12:02 PM    Study Result   Narrative & Impression  CLINICAL DATA:  84F with severe aortic stenosis being evaluated for a TAVR procedure.   EXAM: Cardiac TAVR CT   TECHNIQUE: A non-contrast, gated CT scan was obtained with axial slices of 2.5 mm through the heart for aortic valve scoring. A 120 kV retrospective, gated, contrast cardiac scan was obtained. Gantry rotation speed was 230 msec and collimation was 0.63 mm. Nitroglycerin  was not given. A delayed scan was obtained to exclude left atrial appendage thrombus. The 3D dataset was reconstructed in systole with motion correction. The 3D data set was reconstructed in 5% intervals of the 0-95% of the R-R cycle. Systolic and  diastolic phases were analyzed on a dedicated workstation using MPR, MIP, and VRT modes. The patient received 100 cc of contrast.   FINDINGS: Aortic Root:   Aortic valve: trileaflet   Aortic valve calcium  score: 1714   Aortic annulus:   Diameter: 27mm x 22mm   Perimeter: 76mm   Area: 425mm^2   Calcifications: Mild calcification adjacent to left cusp   Coronary height: Min Left - 12mm; Min Right - 11mm   Sinotubular height: Left cusp - 22mm; Right cusp - 17mm; Noncoronary cusp - 24mm   LVOT (as measured 3 mm below the annulus):   Diameter: 29mm x 22mm   Area: 472 mm^2   Calcifications: Mild calcification inferior to left cusp   Aortic sinus width: Left cusp - 28mm; Right cusp - 29mm; Noncoronary cusp - 31mm   Sinotubular junction width: 29mm x 28mm   Optimum Fluoroscopic Angle for Delivery: RAO 1 CAU 4   Cardiac:   Right atrium: Normal size   Right ventricle: Normal size   Pulmonary arteries: Normal size   Pulmonary veins: Normal configuration   Left atrium: Mild enlargement   Left ventricle: Normal size  Pericardium: Normal thickness   Coronary arteries: Coronary calcium  score 1410 (94th percentile)   IMPRESSION: 1. Tricuspid aortic valve with severe calcifications (AV calcium  score 1714)   2. Aortic annulus measures 27mm x 22mm in diameter with perimeter 76mm and area 451 mm^2. Mild annular calcifications adjacent to left coronary cusp and extending into LVOT. Annular measurements are suitable for delivery of 26mm Edwards Sapien 3 valve   3. Borderline low coronary height for both RCA (11mm) and left main (12mm)   4. Optimum Fluoroscopic Angle for Delivery:  RAO 1 CAU 4   5. Coronary calcium  score 1410 (94th percentile)   Electronically Signed: By: Lonni Nanas M.D. On: 07/09/2024 16:29        Narrative & Impression  CLINICAL DATA:  Aortic valve replacement, preoperative evaluation   EXAM: CTA ABDOMEN AND PELVIS WITHOUT  AND WITH CONTRAST   TECHNIQUE: Multidetector CT imaging of the abdomen and pelvis was performed using the standard protocol during bolus administration of intravenous contrast. Multiplanar reconstructed images and MIPs were obtained and reviewed to evaluate the vascular anatomy.   RADIATION DOSE REDUCTION: This exam was performed according to the departmental dose-optimization program which includes automated exposure control, adjustment of the mA and/or kV according to patient size and/or use of iterative reconstruction technique.   CONTRAST:  OMNIPAQUE  IOHEXOL  350 MG/ML SOLN   COMPARISON:  CT of the abdomen performed May 24 2006   FINDINGS: VASCULAR   Aorta: Patent with diffuse calcific atherosclerosis. Minimal diameter of the infrarenal segment measures 7.9 mm.   Celiac: Mild stenosis in the proximal segment secondary to calcific atherosclerosis.   SMA: Patent.   Renals: Single right renal artery with moderate to severe stenosis in the proximal segment. Single left renal artery with mild stenosis in the proximal segment.   IMA: Patent.   Inflow: Mild plaque with minimal diameter in the right common iliac artery measuring 7 mm. The right internal iliac artery is patent. The right external iliac artery is patent with minimal diameter measuring 7 mm.   The left common iliac artery is patent with mild plaque and minimal diameter estimated at 7.8 mm. Left internal iliac artery is patent. The left external iliac artery is patent with minimal diameter measuring 7 mm.   Proximal Outflow: Right common femoral artery is patent with minimal diameter measuring 7 mm. Left common femoral artery is patent with minimal diameter measuring 6.8 mm.   Veins: A recanalized umbilical vein is apparent. Esophageal varices.   Review of the MIP images confirms the above findings.   NON-VASCULAR   Lower chest: Reported separately.   Hepatobiliary: The liver demonstrates a  nodular surface contour with hypertrophy of the caudate. Gallstones are present within the gallbladder. Two liver cysts are favored with the largest measuring 1.6 cm.   Pancreas: Unremarkable. No pancreatic ductal dilatation or surrounding inflammatory changes.   Spleen: Within normal limits for size.   Adrenals/Urinary Tract: Adrenal glands are unremarkable. Kidneys are normal, without renal calculi, focal lesion, or hydronephrosis. Bladder is unremarkable.   Stomach/Bowel: No dilated loops of bowel are appreciated.   Lymphatic: No evidence of significant lymphadenopathy.   Reproductive: Status post hysterectomy. No adnexal masses.   Other: Fat containing umbilical hernia.   Musculoskeletal: Degenerative changes in the lumbar spine.   IMPRESSION: 1. Imaging features of cirrhosis and portal hypertension with esophageal varices. 2. Minimal vessel diameters detailed above. 3. Moderate to severe right renal artery stenosis.     Electronically Signed  By: Maude Naegeli M.D.   On: 07/07/2024 09:49        Impression:   This 83 year old woman has stage D, severe, symptomatic low-flow/low gradient aortic stenosis with NYHA class II symptoms of exertional fatigue and shortness of breath consistent with chronic diastolic congestive heart failure.  I have personally reviewed her 2D echocardiogram, cardiac catheterization, and CTA studies.  Her echo in May 2025 showed a severely calcified and thickened aortic valve with a mean gradient of 27 mmHg with a valve area of 0.83 cm and dimensionless index of 0.24 consistent with severe aortic stenosis.  Stroke-volume index was on the low side.  Left ventricular ejection fraction was 50%.  Cardiac catheterization showed normal coronary arteries.  I agree that aortic valve replacement is indicated in this patient for relief of her symptoms and to prevent progressive left ventricular dysfunction.  Given her advanced age and cirrhosis with portal  hypertension and varices I think transcatheter aortic valve replacement would be the best option for treating her.  Her gated cardiac CTA shows anatomy suitable for TAVR using a 26 mm SAPIEN 3 valve.  Her abdominal and pelvic CTA shows adequate pelvic vascular anatomy to allow transfemoral insertion.   Her gated cardiac CTA showed a possible left hepatic lobe mass although was not mentioned on her CTA of the abdomen and pelvis.  It is an area with ill-defined increased enhancement in segment 2 of the left lobe.  She was scheduled for an MRI of the abdomen which was performed on 07/16/2024 but it could not be completed due to problems with the magnet.  This has been rescheduled.     The patient and her son were counseled at length regarding treatment alternatives for management of severe symptomatic aortic stenosis. The risks and benefits of surgical intervention has been discussed in detail. Long-term prognosis with medical therapy was discussed. Alternative approaches such as conventional surgical aortic valve replacement, transcatheter aortic valve replacement, and palliative medical therapy were compared and contrasted at length. This discussion was placed in the context of the patient's own specific clinical presentation and past medical history. All of their questions have been addressed.    Following the decision to proceed with transcatheter aortic valve replacement, a discussion was held regarding what types of management strategies would be attempted intraoperatively in the event of life-threatening complications, including whether or not the patient would be considered a candidate for the use of cardiopulmonary bypass and/or conversion to open sternotomy for attempted surgical intervention.  She is 83 years old with evidence of cirrhosis on CT scan with portal hypertension and esophageal varices.  I do not think she would be a candidate for emergent sternotomy to manage any intraoperative  complications.  He patient has been advised of a variety of complications that might develop including but not limited to risks of death, stroke, paravalvular leak, aortic dissection or other major vascular complications, aortic annulus rupture, device embolization, cardiac rupture or perforation, mitral regurgitation, acute myocardial infarction, arrhythmia, heart block or bradycardia requiring permanent pacemaker placement, congestive heart failure, respiratory failure, renal failure, pneumonia, infection, other late complications related to structural valve deterioration or migration, or other complications that might ultimately cause a temporary or permanent loss of functional independence or other long term morbidity. The patient provides full informed consent for the procedure as described and all questions were answered.       Plan:   Transcatheter aortic valve replacement using a SAPIEN 3 valve.  Dorise LOIS Fellers, MD

## 2024-08-14 ENCOUNTER — Encounter (HOSPITAL_COMMUNITY): Payer: Self-pay | Admitting: Internal Medicine

## 2024-08-14 ENCOUNTER — Other Ambulatory Visit: Payer: Self-pay

## 2024-08-14 ENCOUNTER — Inpatient Hospital Stay (HOSPITAL_COMMUNITY): Admitting: Anesthesiology

## 2024-08-14 ENCOUNTER — Inpatient Hospital Stay (HOSPITAL_COMMUNITY)

## 2024-08-14 ENCOUNTER — Encounter (HOSPITAL_COMMUNITY): Admission: RE | Disposition: A | Payer: Self-pay | Source: Home / Self Care | Attending: Internal Medicine

## 2024-08-14 ENCOUNTER — Inpatient Hospital Stay (HOSPITAL_COMMUNITY)
Admission: RE | Admit: 2024-08-14 | Discharge: 2024-08-15 | DRG: 267 | Disposition: A | Attending: Internal Medicine | Admitting: Internal Medicine

## 2024-08-14 ENCOUNTER — Inpatient Hospital Stay (HOSPITAL_COMMUNITY): Admitting: Vascular Surgery

## 2024-08-14 DIAGNOSIS — I8510 Secondary esophageal varices without bleeding: Secondary | ICD-10-CM | POA: Diagnosis present

## 2024-08-14 DIAGNOSIS — Z8249 Family history of ischemic heart disease and other diseases of the circulatory system: Secondary | ICD-10-CM | POA: Diagnosis not present

## 2024-08-14 DIAGNOSIS — Z952 Presence of prosthetic heart valve: Secondary | ICD-10-CM | POA: Diagnosis not present

## 2024-08-14 DIAGNOSIS — I272 Pulmonary hypertension, unspecified: Secondary | ICD-10-CM | POA: Diagnosis present

## 2024-08-14 DIAGNOSIS — K7460 Unspecified cirrhosis of liver: Secondary | ICD-10-CM | POA: Diagnosis present

## 2024-08-14 DIAGNOSIS — Z823 Family history of stroke: Secondary | ICD-10-CM | POA: Diagnosis not present

## 2024-08-14 DIAGNOSIS — Z79899 Other long term (current) drug therapy: Secondary | ICD-10-CM | POA: Diagnosis not present

## 2024-08-14 DIAGNOSIS — I350 Nonrheumatic aortic (valve) stenosis: Secondary | ICD-10-CM | POA: Diagnosis present

## 2024-08-14 DIAGNOSIS — N1831 Chronic kidney disease, stage 3a: Secondary | ICD-10-CM | POA: Diagnosis present

## 2024-08-14 DIAGNOSIS — R55 Syncope and collapse: Secondary | ICD-10-CM | POA: Diagnosis present

## 2024-08-14 DIAGNOSIS — E1122 Type 2 diabetes mellitus with diabetic chronic kidney disease: Secondary | ICD-10-CM | POA: Diagnosis present

## 2024-08-14 DIAGNOSIS — Z87891 Personal history of nicotine dependence: Secondary | ICD-10-CM | POA: Diagnosis not present

## 2024-08-14 DIAGNOSIS — J449 Chronic obstructive pulmonary disease, unspecified: Secondary | ICD-10-CM | POA: Diagnosis present

## 2024-08-14 DIAGNOSIS — Z923 Personal history of irradiation: Secondary | ICD-10-CM | POA: Diagnosis not present

## 2024-08-14 DIAGNOSIS — K219 Gastro-esophageal reflux disease without esophagitis: Secondary | ICD-10-CM | POA: Diagnosis present

## 2024-08-14 DIAGNOSIS — I129 Hypertensive chronic kidney disease with stage 1 through stage 4 chronic kidney disease, or unspecified chronic kidney disease: Secondary | ICD-10-CM | POA: Diagnosis present

## 2024-08-14 DIAGNOSIS — Z90710 Acquired absence of both cervix and uterus: Secondary | ICD-10-CM | POA: Diagnosis not present

## 2024-08-14 DIAGNOSIS — K766 Portal hypertension: Secondary | ICD-10-CM | POA: Diagnosis present

## 2024-08-14 DIAGNOSIS — Z853 Personal history of malignant neoplasm of breast: Secondary | ICD-10-CM | POA: Diagnosis not present

## 2024-08-14 DIAGNOSIS — Z006 Encounter for examination for normal comparison and control in clinical research program: Secondary | ICD-10-CM | POA: Diagnosis not present

## 2024-08-14 DIAGNOSIS — N183 Chronic kidney disease, stage 3 unspecified: Secondary | ICD-10-CM

## 2024-08-14 DIAGNOSIS — E1165 Type 2 diabetes mellitus with hyperglycemia: Secondary | ICD-10-CM | POA: Diagnosis present

## 2024-08-14 DIAGNOSIS — I10 Essential (primary) hypertension: Secondary | ICD-10-CM | POA: Diagnosis not present

## 2024-08-14 DIAGNOSIS — E785 Hyperlipidemia, unspecified: Secondary | ICD-10-CM | POA: Diagnosis present

## 2024-08-14 DIAGNOSIS — Z9861 Coronary angioplasty status: Secondary | ICD-10-CM | POA: Diagnosis not present

## 2024-08-14 HISTORY — DX: Nonrheumatic aortic (valve) stenosis: I35.0

## 2024-08-14 HISTORY — DX: Chronic kidney disease, stage 3 unspecified: N18.30

## 2024-08-14 HISTORY — PX: INTRAOPERATIVE TRANSTHORACIC ECHOCARDIOGRAM: SHX6523

## 2024-08-14 LAB — ECHOCARDIOGRAM LIMITED
AR max vel: 1.94 cm2
AV Area VTI: 1.89 cm2
AV Area mean vel: 1.99 cm2
AV Mean grad: 3 mmHg
AV Peak grad: 5.8 mmHg
Ao pk vel: 1.2 m/s
Area-P 1/2: 4.1 cm2
S' Lateral: 2.9 cm

## 2024-08-14 LAB — GLUCOSE, CAPILLARY
Glucose-Capillary: 81 mg/dL (ref 70–99)
Glucose-Capillary: 67 mg/dL — ABNORMAL LOW (ref 70–99)
Glucose-Capillary: 89 mg/dL (ref 70–99)

## 2024-08-14 LAB — POCT I-STAT, CHEM 8
BUN: 19 mg/dL (ref 8–23)
Calcium, Ion: 1.21 mmol/L (ref 1.15–1.40)
Chloride: 106 mmol/L (ref 98–111)
Creatinine, Ser: 0.9 mg/dL (ref 0.44–1.00)
Glucose, Bld: 131 mg/dL — ABNORMAL HIGH (ref 70–99)
HCT: 33 % — ABNORMAL LOW (ref 36.0–46.0)
Hemoglobin: 11.2 g/dL — ABNORMAL LOW (ref 12.0–15.0)
Potassium: 3.8 mmol/L (ref 3.5–5.1)
Sodium: 140 mmol/L (ref 135–145)
TCO2: 23 mmol/L (ref 22–32)

## 2024-08-14 LAB — ABO/RH: ABO/RH(D): A POS

## 2024-08-14 LAB — POCT ACTIVATED CLOTTING TIME: Activated Clotting Time: 337 s

## 2024-08-14 SURGERY — TRANSCATHETER AORTIC VALVE REPLACEMENT, TRANSFEMORAL (CATHLAB)
Anesthesia: Monitor Anesthesia Care

## 2024-08-14 MED ORDER — MELATONIN 3 MG PO TABS
3.0000 mg | ORAL_TABLET | Freq: Every day | ORAL | Status: DC
  Filled 2024-08-14: qty 1

## 2024-08-14 MED ORDER — LIDOCAINE HCL (PF) 1 % IJ SOLN
INTRAMUSCULAR | Status: AC
  Filled 2024-08-14: qty 30

## 2024-08-14 MED ORDER — NOREPINEPHRINE 4 MG/250ML-% IV SOLN
0.0000 ug/min | INTRAVENOUS | Status: DC
  Filled 2024-08-14: qty 250

## 2024-08-14 MED ORDER — ASPIRIN 81 MG PO CHEW
81.0000 mg | CHEWABLE_TABLET | Freq: Every day | ORAL | Status: DC
  Administered 2024-08-15: 81 mg via ORAL
  Filled 2024-08-14: qty 1

## 2024-08-14 MED ORDER — CEFAZOLIN SODIUM-DEXTROSE 2-4 GM/100ML-% IV SOLN
2.0000 g | Freq: Three times a day (TID) | INTRAVENOUS | Status: AC
  Administered 2024-08-14 – 2024-08-15 (×2): 2 g via INTRAVENOUS
  Filled 2024-08-14 (×2): qty 100

## 2024-08-14 MED ORDER — HEPARIN (PORCINE) IN NACL 1000-0.9 UT/500ML-% IV SOLN
INTRAVENOUS | Status: DC | PRN
  Administered 2024-08-14 (×3): 500 mL

## 2024-08-14 MED ORDER — CHLORHEXIDINE GLUCONATE 0.12 % MT SOLN
15.0000 mL | Freq: Once | OROMUCOSAL | Status: AC
  Administered 2024-08-14: 15 mL via OROMUCOSAL
  Filled 2024-08-14: qty 15

## 2024-08-14 MED ORDER — CHLORHEXIDINE GLUCONATE 4 % EX SOLN: 60.0000 mL | Freq: Once | CUTANEOUS | Status: DC

## 2024-08-14 MED ORDER — FESOTERODINE FUMARATE ER 4 MG PO TB24
4.0000 mg | ORAL_TABLET | Freq: Every day | ORAL | Status: DC
  Administered 2024-08-14 – 2024-08-15 (×2): 4 mg via ORAL
  Filled 2024-08-14 (×2): qty 1

## 2024-08-14 MED ORDER — PROPOFOL 500 MG/50ML IV EMUL
INTRAVENOUS | Status: DC | PRN
  Administered 2024-08-14: 30 ug/kg/min via INTRAVENOUS

## 2024-08-14 MED ORDER — SODIUM CHLORIDE 0.9% FLUSH: 3.0000 mL | INTRAVENOUS | Status: DC | PRN

## 2024-08-14 MED ORDER — TRAZODONE HCL 50 MG PO TABS
50.0000 mg | ORAL_TABLET | Freq: Every evening | ORAL | Status: DC | PRN
  Administered 2024-08-15: 50 mg via ORAL
  Filled 2024-08-14: qty 1

## 2024-08-14 MED ORDER — SODIUM CHLORIDE 0.9 % IV SOLN: INTRAVENOUS | Status: DC | PRN

## 2024-08-14 MED ORDER — PROTAMINE SULFATE 10 MG/ML IV SOLN
INTRAVENOUS | Status: DC | PRN
  Administered 2024-08-14: 80 mg via INTRAVENOUS

## 2024-08-14 MED ORDER — ACETAMINOPHEN 325 MG PO TABS: 650.0000 mg | ORAL_TABLET | Freq: Four times a day (QID) | ORAL | Status: DC | PRN

## 2024-08-14 MED ORDER — INSULIN ASPART 100 UNIT/ML IJ SOLN
0.0000 [IU] | Freq: Three times a day (TID) | INTRAMUSCULAR | Status: DC
  Administered 2024-08-14: 4 [IU] via SUBCUTANEOUS
  Filled 2024-08-14: qty 4

## 2024-08-14 MED ORDER — IODIXANOL 320 MG/ML IV SOLN
INTRAVENOUS | Status: DC | PRN
  Administered 2024-08-14: 70 mL

## 2024-08-14 MED ORDER — MORPHINE SULFATE (PF) 2 MG/ML IV SOLN
1.0000 mg | INTRAVENOUS | Status: DC | PRN
  Administered 2024-08-14: 2 mg via INTRAVENOUS
  Filled 2024-08-14 (×2): qty 1

## 2024-08-14 MED ORDER — ACETAMINOPHEN 650 MG RE SUPP
650.0000 mg | Freq: Four times a day (QID) | RECTAL | Status: DC | PRN
  Filled 2024-08-14: qty 1

## 2024-08-14 MED ORDER — ONDANSETRON HCL 4 MG/2ML IJ SOLN: 4.0000 mg | Freq: Four times a day (QID) | INTRAMUSCULAR | Status: DC | PRN

## 2024-08-14 MED ORDER — OXYCODONE HCL 5 MG PO TABS
5.0000 mg | ORAL_TABLET | ORAL | Status: DC | PRN
  Administered 2024-08-14 – 2024-08-15 (×2): 5 mg via ORAL
  Filled 2024-08-14 (×2): qty 1

## 2024-08-14 MED ORDER — SODIUM CHLORIDE 0.9 % IV SOLN: 250.0000 mL | INTRAVENOUS | Status: DC | PRN

## 2024-08-14 MED ORDER — LIDOCAINE HCL (PF) 1 % IJ SOLN
INTRAMUSCULAR | Status: DC | PRN
  Administered 2024-08-14: 15 mL

## 2024-08-14 MED ORDER — NITROGLYCERIN IN D5W 200-5 MCG/ML-% IV SOLN: 0.0000 ug/min | INTRAVENOUS | Status: DC

## 2024-08-14 MED ORDER — CHLORHEXIDINE GLUCONATE 4 % EX SOLN: 30.0000 mL | CUTANEOUS | Status: DC

## 2024-08-14 MED ORDER — SODIUM CHLORIDE 0.9 % IV SOLN: INTRAVENOUS | Status: DC

## 2024-08-14 MED ORDER — MAGNESIUM OXIDE 400 MG PO TABS
400.0000 mg | ORAL_TABLET | Freq: Every day | ORAL | Status: DC
  Administered 2024-08-14 – 2024-08-15 (×2): 400 mg via ORAL
  Filled 2024-08-14 (×4): qty 1

## 2024-08-14 MED ORDER — HEPARIN SODIUM (PORCINE) 1000 UNIT/ML IJ SOLN
INTRAMUSCULAR | Status: DC | PRN
  Administered 2024-08-14: 11250 [IU] via INTRAVENOUS

## 2024-08-14 MED ORDER — ROSUVASTATIN CALCIUM 5 MG PO TABS
10.0000 mg | ORAL_TABLET | Freq: Every day | ORAL | Status: DC
  Administered 2024-08-14 – 2024-08-15 (×2): 10 mg via ORAL
  Filled 2024-08-14 (×2): qty 2

## 2024-08-14 MED ORDER — SODIUM CHLORIDE 0.9 % IV SOLN: INTRAVENOUS | Status: AC

## 2024-08-14 MED ORDER — TRAMADOL HCL 50 MG PO TABS: 50.0000 mg | ORAL_TABLET | ORAL | Status: DC | PRN

## 2024-08-14 MED ORDER — SODIUM CHLORIDE 0.9% FLUSH
3.0000 mL | Freq: Two times a day (BID) | INTRAVENOUS | Status: DC
  Administered 2024-08-14 – 2024-08-15 (×2): 3 mL via INTRAVENOUS

## 2024-08-14 SURGICAL SUPPLY — 29 items
BAG SNAP BAND KOVER 36X36 (MISCELLANEOUS) ×2 IMPLANT
CABLE SURGICAL S-101-97-12 (CABLE) IMPLANT
CATH 26 ULTRA DELIVERY (CATHETERS) IMPLANT
CATH DIAG 6FR PIGTAIL ANGLED (CATHETERS) IMPLANT
CATH INFINITI 5FR ANG PIGTAIL (CATHETERS) IMPLANT
CATH INFINITI 6F AL1 (CATHETERS) IMPLANT
CLOSURE MYNX CONTROL 6F/7F (Vascular Products) IMPLANT
CLOSURE PERCLOSE PROSTYLE (Vascular Products) IMPLANT
CRIMPER (MISCELLANEOUS) IMPLANT
DEVICE INFLATION ATRION QL2530 (MISCELLANEOUS) IMPLANT
ELECT DEFIB PAD ADLT CADENCE (PAD) IMPLANT
HEMOSTAT KELLY 5.5 SS STRL (MISCELLANEOUS) IMPLANT
KIT SAPIAN 3 ULTRA RESILIA 26 (Valve) IMPLANT
PACK CARDIAC CATHETERIZATION (CUSTOM PROCEDURE TRAY) ×1 IMPLANT
PAD SORBX EP SHIELD 16.5X12 (MISCELLANEOUS) IMPLANT
SET ATX-X65L (MISCELLANEOUS) IMPLANT
SHEATH INTRODUCER SET 20-26 (SHEATH) IMPLANT
SHEATH PINNACLE 6F 10CM (SHEATH) IMPLANT
SHEATH PINNACLE 8F 10CM (SHEATH) IMPLANT
STOPCOCK MORSE 400PSI 3WAY (MISCELLANEOUS) ×2 IMPLANT
SYR CONTROL 10ML ANGIOGRAPHIC (SYRINGE) IMPLANT
TUBING ART PRESS 72 MALE/FEM (TUBING) IMPLANT
TUBING CIL FLEX 10 FLL-RA (TUBING) IMPLANT
WIRE AMPLATZ SS-J .035X180CM (WIRE) IMPLANT
WIRE EMERALD 3MM-J .035X150CM (WIRE) IMPLANT
WIRE EMERALD 3MM-J .035X260CM (WIRE) IMPLANT
WIRE EMERALD ST .035X260CM (WIRE) IMPLANT
WIRE MICRO SET SILHO 5FR 7 (SHEATH) IMPLANT
WIRE SAFARI SM CURVE 275 (WIRE) IMPLANT

## 2024-08-14 NOTE — Op Note (Signed)
 [facility]   MULTIDISCIPLINARY HEART VALVE TEAM   TAVR OPERATIVE NOTE   Date of Procedure:  08/14/2024  Preoperative Diagnosis: Severe Aortic Stenosis   Postoperative Diagnosis: Same   Procedure:   Transcatheter Aortic Valve Replacement - Percutaneous Right Transfemoral Approach  Edwards Sapien 3 Ultra Resilia THV (size 26 mm, model # 9755RSL, serial # [id number])   Co-Surgeons:  Dorise LOIS Fellers, MD and Lurena Red, MD   Anesthesiologist:  Keneth, MD  Echocardiographer:  Delford, MD  Pre-operative Echo Findings: Severe aortic stenosis mild left ventricular systolic dysfunction  Post-operative Echo Findings: No paravalvular leak Unchanged mild left ventricular systolic dysfunction   BRIEF CLINICAL NOTE AND INDICATIONS FOR SURGERY  This 83 year old woman has stage D, severe, symptomatic low-flow/low gradient aortic stenosis with NYHA class II symptoms of exertional fatigue and shortness of breath consistent with chronic diastolic congestive heart failure.  I have personally reviewed her 2D echocardiogram, cardiac catheterization, and CTA studies.  Her echo in May 2025 showed a severely calcified and thickened aortic valve with a mean gradient of 27 mmHg with a valve area of 0.83 cm and dimensionless index of 0.24 consistent with severe aortic stenosis.  Stroke-volume index was on the low side.  Left ventricular ejection fraction was 50%.  Cardiac catheterization showed normal coronary arteries.  I agree that aortic valve replacement is indicated in this patient for relief of her symptoms and to prevent progressive left ventricular dysfunction.  Given her advanced age and cirrhosis with portal hypertension and varices I think transcatheter aortic valve replacement would be the best option for treating her.  Her gated cardiac CTA shows anatomy suitable for TAVR using a 26 mm SAPIEN 3 valve.  Her abdominal and pelvic CTA shows adequate pelvic vascular anatomy to allow  transfemoral insertion.   Her gated cardiac CTA showed a possible left hepatic lobe mass although was not mentioned on her CTA of the abdomen and pelvis.  It is an area with ill-defined increased enhancement in segment 2 of the left lobe.  She was scheduled for an MRI of the abdomen which was performed on 07/16/2024 but it could not be completed due to problems with the magnet.  This has been rescheduled.     The patient and her son were counseled at length regarding treatment alternatives for management of severe symptomatic aortic stenosis. The risks and benefits of surgical intervention has been discussed in detail. Long-term prognosis with medical therapy was discussed. Alternative approaches such as conventional surgical aortic valve replacement, transcatheter aortic valve replacement, and palliative medical therapy were compared and contrasted at length. This discussion was placed in the context of the patient's own specific clinical presentation and past medical history. All of their questions have been addressed.    Following the decision to proceed with transcatheter aortic valve replacement, a discussion was held regarding what types of management strategies would be attempted intraoperatively in the event of life-threatening complications, including whether or not the patient would be considered a candidate for the use of cardiopulmonary bypass and/or conversion to open sternotomy for attempted surgical intervention.  She is 83 years old with evidence of cirrhosis on CT scan with portal hypertension and esophageal varices.  I do not think she would be a candidate for emergent sternotomy to manage any intraoperative complications.  He patient has been advised of a variety of complications that might develop including but not limited to risks of death, stroke, paravalvular leak, aortic dissection or other major vascular complications, aortic  annulus rupture, device embolization, cardiac rupture or  perforation, mitral regurgitation, acute myocardial infarction, arrhythmia, heart block or bradycardia requiring permanent pacemaker placement, congestive heart failure, respiratory failure, renal failure, pneumonia, infection, other late complications related to structural valve deterioration or migration, or other complications that might ultimately cause a temporary or permanent loss of functional independence or other long term morbidity. The patient provides full informed consent for the procedure as described and all questions were answered.        DETAILS OF THE OPERATIVE PROCEDURE  PREPARATION:    The patient was brought to the operating room on the above mentioned date and appropriate monitoring was established by the anesthesia team. The patient was placed in the supine position on the operating table.  Intravenous antibiotics were administered. The patient was monitored closely throughout the procedure under conscious sedation.   Baseline transthoracic echocardiogram was performed. The patient's abdomen and both groins were prepped and draped in a sterile manner. A time out procedure was performed.   PERIPHERAL ACCESS:    Using the modified Seldinger technique, femoral arterial  access was obtained with placement of a 6 Fr sheath on the left side.  A pigtail diagnostic catheter was passed through the left arterial sheath under fluoroscopic guidance into the aortic root. Aortic root angiography was performed in order to determine the optimal angiographic angle for valve deployment.   TRANSFEMORAL ACCESS:   Percutaneous transfemoral access and sheath placement was performed using ultrasound guidance.  The right common femoral artery was cannulated using a micropuncture needle and appropriate location was verified using hand injection angiogram.  A pair of Abbott Perclose percutaneous closure devices were placed and a 6 French sheath replaced into the femoral artery.  The patient was  heparinized systemically and ACT verified > 250 seconds.    A 14 Fr transfemoral E-sheath was introduced into the right common femoral artery after progressively dilating over an Amplatz superstiff wire. An AL-1 catheter was used to direct a straight-tip exchange length wire across the native aortic valve into the left ventricle. This was exchanged out for a pigtail catheter and position was confirmed in the LV apex. Simultaneous LV and Ao pressures were recorded.  The pigtail catheter was exchanged for a Safari wire in the LV apex. Direct LV pacing threshold through the [facility] was tested and was satisfactory.  BALLOON AORTIC VALVULOPLASTY:   Not performed   TRANSCATHETER HEART VALVE DEPLOYMENT:   An Edwards Sapien 3 Ultra Resilia transcatheter heart valve (size 26 mm) was prepared and crimped per manufacturer's guidelines, and the proper orientation of the valve is confirmed on the [employer] delivery system. The valve was advanced through the introducer sheath using normal technique until in an appropriate position in the abdominal aorta beyond the sheath tip. The balloon was then retracted and using the fine-tuning wheel was centered on the valve. The valve was then advanced across the aortic arch using appropriate flexion of the catheter. The valve was carefully positioned across the aortic valve annulus. The Commander catheter was retracted using normal technique. Once final position of the valve has been confirmed by angiographic assessment, the valve is deployed during rapid ventricular pacing to maintain systolic blood pressure < 50 mmHg and pulse pressure < 10 mmHg. The balloon inflation is held for >3 seconds after reaching full deployment volume. Once the balloon has fully deflated the balloon is retracted into the ascending aorta and valve function is assessed using echocardiography. There is felt to be no paravalvular leak  and no central aortic insufficiency.  The patient's hemodynamic  recovery following valve deployment is good.  The deployment balloon and guidewire are both removed.    PROCEDURE COMPLETION:   The sheath was removed and femoral artery closure performed.  Protamine  was administered once femoral arterial repair was complete. The temporary pacemaker, pigtail catheter and femoral sheaths were removed with manual pressure used for venous hemostasis.  A Mynx femoral closure device was utilized following removal of the diagnostic sheath in the left femoral artery.  The patient tolerated the procedure well and is transported to the cath lab recovery area in stable condition. There were no immediate intraoperative complications. All sponge instrument and needle counts are verified correct at completion of the operation.   No blood products were administered during the operation.  The patient received a total of 70 mL of intravenous contrast during the procedure.   Dorise MARLA Fellers, MD 08/14/2024

## 2024-08-14 NOTE — Op Note (Signed)
 HEART AND VASCULAR CENTER  TAVR OPERATIVE NOTE   Date of Procedure:  08/14/2024  Preoperative Diagnosis: Severe Aortic Stenosis   Postoperative Diagnosis: Same   Procedure:   Transcatheter Aortic Valve Replacement - Transfemoral Approach  Edwards Sapien 3 Resilia THV (size 26 mm, model # 9755RLS, serial # [id number])   Co-Surgeons:  Dorise LOIS Fellers, MD and Lurena Red, MD Anesthesiologist:  Keneth  Echocardiographer:  Delford  Pre-operative Echo Findings: Severe aortic stenosis Mild left ventricular systolic dysfunction  Post-operative Echo Findings: No paravalvular leak Unchanged left ventricular systolic function  Left Heart Catheterization Findings: Left ventricular end-diastolic pressure of   BRIEF CLINICAL NOTE AND INDICATIONS FOR SURGERY  The patient is an 83 year old female with a history of hypertension, hyperlipidemia, diet-controlled type 2 diabetes, breast cancer status postlumpectomy, radiation, and chemotherapy, NASH, nonischemic cardiomyopathy with mild LV dysfunction, and moderate to severe symptomatic aortic stenosis who is referred for elective transcatheter arctic valve replacement with a 26 mm SAPIEN 3 valve from the right transfemoral approach with direct left ventricular pacing.  During the course of the patient's preoperative work up they have been evaluated comprehensively by a multidisciplinary team of specialists coordinated through the Multidisciplinary Heart Valve Clinic in the [facility].  They have been demonstrated to suffer from symptomatic severe aortic stenosis as noted above. The patient has been counseled extensively as to the relative risks and benefits of all options for the treatment of severe aortic stenosis including long term medical therapy, conventional surgery for aortic valve replacement, and transcatheter aortic valve replacement.  The patient has been independently evaluated by Dr. Fellers with CT surgery  and they are felt to be at high risk for conventional surgical aortic valve replacement. The surgeon indicated the patient would be a poor candidate for conventional surgery. Based upon review of all of the patient's preoperative diagnostic tests they are felt to be candidate for transcatheter aortic valve replacement using the transfemoral approach as an alternative to high risk conventional surgery.    Following the decision to proceed with transcatheter aortic valve replacement, a discussion has been held regarding what types of management strategies would be attempted intraoperatively in the event of life-threatening complications, including whether or not the patient would be considered a candidate for the use of cardiopulmonary bypass and/or conversion to open sternotomy for attempted surgical intervention.  The patient has been advised of a variety of complications that might develop peculiar to this approach including but not limited to risks of death, stroke, paravalvular leak, aortic dissection or other major vascular complications, aortic annulus rupture, device embolization, cardiac rupture or perforation, acute myocardial infarction, arrhythmia, heart block or bradycardia requiring permanent pacemaker placement, congestive heart failure, respiratory failure, renal failure, pneumonia, infection, other late complications related to structural valve deterioration or migration, or other complications that might ultimately cause a temporary or permanent loss of functional independence or other long term morbidity.  The patient provides full informed consent for the procedure as described and all questions were answered preoperatively.    DETAILS OF THE OPERATIVE PROCEDURE  PREPARATION:   The patient is brought to the operating room on the above mentioned date and central monitoring was established by the anesthesia team. The patient is placed in the supine position on the operating table.   Intravenous antibiotics are administered. Conscious sedation is used.   Baseline transthoracic echocardiogram was performed. The patient's chest, abdomen, both groins, and both lower extremities are prepared and draped in a sterile  manner. A time out procedure is performed.   PERIPHERAL ACCESS:   Using the modified Seldinger technique, femoral arterial and venous access were obtained with placement of a 6 Fr sheath in the left common femoral artery using u/s guidance.  A pigtail diagnostic catheter was passed through the femoral arterial sheath under fluoroscopic guidance into the aortic root.   Aortic root angiography was performed in order to determine the optimal angiographic angle for valve deployment.  TRANSFEMORAL ACCESS:  A micropuncture kit was used to gain access to the right common femoral artery using u/s guidance. Position confirmed with angiography. Pre-closure with double ProGlide closure devices. The patient was heparinized systemically and ACT verified > 250 seconds.    A 16 Fr transfemoral E-sheath was introduced into the right common femoral artery after progressively dilating over an Amplatz superstiff wire. An AL-1 catheter was used to direct a straight-tip exchange length wire across the native aortic valve into the left ventricle. This was exchanged out for a pigtail catheter and position was confirmed in the LV apex. Simultaneous left ventricular, aortic, and left ventricular end-diastolic pressures were recorded.  The pigtail catheter was then exchanged for an Safari wire in the LV apex.  Direct LV pacing thresholds were assessed and found to be adequate.   TRANSCATHETER HEART VALVE DEPLOYMENT:  An Edwards Sapien 3 THV (size 26 mm) was prepared and crimped per manufacturer's guidelines, and the proper orientation of the valve is confirmed on the [employer] delivery system. The valve was advanced through the introducer sheath using normal technique until in an appropriate  position in the abdominal aorta beyond the sheath tip. The balloon was then retracted and using the fine-tuning wheel was centered on the valve. The valve was then advanced across the aortic arch using appropriate flexion of the catheter. The valve was carefully positioned across the aortic valve annulus. The Commander catheter was retracted using normal technique. Once final position of the valve has been confirmed by angiographic assessment, the valve is deployed while temporarily holding ventilation and during rapid ventricular pacing to maintain systolic blood pressure < 50 mmHg and pulse pressure < 10 mmHg. The balloon inflation is held for >3 seconds after reaching full deployment volume. Once the balloon has fully deflated the balloon is retracted into the ascending aorta and valve function is assessed using TTE. There is felt to be no paravalvular leak and no central aortic insufficiency.  The patient's hemodynamic recovery following valve deployment is good.  The deployment balloon and guidewire are both removed. Echo demostrated acceptable post-procedural gradients, stable mitral valve function, and no AI.   PROCEDURE COMPLETION:  The sheath was then removed and closure devices were completed. Protamine  was administered once femoral arterial repair was complete. The pigtail catheters and femoral sheaths were removed with  a Mynx closure device placed in the artery and manual pressure used for venous hemostasis.    The patient tolerated the procedure well and is transported to the surgical intensive care in stable condition. There were no immediate intraoperative complications. All sponge instrument and needle counts are verified correct at completion of the operation.   No blood products were administered during the operation.  The patient received a total of 70 mL of intravenous contrast during the procedure.  [name] [name] [name] Shore MD 08/14/2024 11:55 AM

## 2024-08-14 NOTE — Discharge Summary (Incomplete)
 HEART AND VASCULAR CENTER   MULTIDISCIPLINARY HEART VALVE TEAM  Discharge Summary    Patient ID: [name] [name] MRN: [id number]; DOB: [birth year]  Admit date: 08/14/2024 Discharge date: 08/14/2024  PCP:  Katina Pfeiffer, PA-C  [facility] Cardiologist:  [name], DO  [facility] Structural heart: None [facility] Electrophysiologist:  None   Discharge Diagnoses    Principal Problem:   S/P TAVR (transcatheter aortic valve replacement) Active Problems:   GERD (gastroesophageal reflux disease)   Severe aortic stenosis   COPD (chronic obstructive pulmonary disease) (HCC)   Cirrhosis (HCC)   Uncontrolled type 2 diabetes mellitus with hyperglycemia (HCC)   Pulmonary hypertension (HCC)   Chronic kidney disease (CKD), stage 3 (HCC)   Allergies Allergies  Allergen Reactions   Cardizem  [Diltiazem ] Swelling    Lower leg edema    Jardiance  [Empagliflozin ] Other (See Comments)    Diagnostic Studies/Procedures    [facility] VASCULAR CENTER  TAVR OPERATIVE NOTE     Date of Procedure:                08/14/2024   Preoperative Diagnosis:      Severe Aortic Stenosis    Postoperative Diagnosis:    Same    Procedure:        Transcatheter Aortic Valve Replacement - Transfemoral Approach             Edwards Sapien 3 Resilia THV (size 26 mm, model # 9755RLS, serial # [id number])              Co-Surgeons:                        Dorise LOIS Fellers, MD and Lurena Red, MD Anesthesiologist:                  Keneth   Echocardiographer:              Delford   Pre-operative Echo Findings: Severe aortic stenosis Mild left ventricular systolic dysfunction   Post-operative Echo Findings: No paravalvular leak Unchanged left ventricular systolic function   Left Heart Catheterization Findings: Left ventricular end-diastolic pressure of   _____________    Echo 08/15/24: completed but pending formal read at the time of discharge   History of Present Illness      [name] [name] is a 83 y.o. female with a history of HTN, HLD, diet controlled T2DM, breast cancer (s/p lumpectomy 2005, XRT and CTX 2006), nonalcoholic cirrhosis with thrombocytopenia, CKD stage IIIa, mild LV dysfunction (NICM) felt to be related to mod-severe AS who presented to [facility] on 08/14/24 for planned TAVR.   Echo 01/20/2024 showed EF 50%, mild asymmetric LVH of the infero-lateral segment, mild RV dysfunction, mild TR, and mod-severe AS with mean grad 27 mmHg, Vmax 3.43 m/s, AVA 0.88 cm2, DVI 0.24, SVI 38. L/RHC 06/25/24 showed normal right dominant circulation. Fick cardiac output of 8.0 L/min and Fick cardiac index of 4.4 L/min/m with the following hemodynamics: RAP mean of 10 mmHg, RVP 35/4 with end-diastolic pressure of 10 mmHg, Wedge pressure mean of 16 mmHg with V waves to 20 mmHg, PA pressure 28/19 with a mean of 25 mmHg. She complained of worsening DOE with minimal exertion and fatigue.   The patient was evaluated by the multidisciplinary valve team and felt to have severe, symptomatic aortic stenosis and to be a suitable candidate for TAVR, which was set up for 08/14/24.  Hospital Course     Consultants:  none    Severe AS:  -- S/p TAVR with a 26 mm Edwards Sapien 3 Ultra Resilia THV via the TF approach on 08/14/24.  -- Post operative echo completed but pending formal read. -- Groin sites are stable.  -- ECG with *** and no high grade heart block. -- Continue Asprin 81mg  daily and Plavix 75mg  daily. / Eliquis 5mg  BID   -- Met with cardiac rehab to discuss CRP phase II.  -- Plan for discharge home today with close follow up in the outpatient setting.   HTN: -- Resume losartan  25mg  daily, spiro 25mg  daily and Toprol  XL 25mg  daily.   HLD: -- Continue Crestor  10mg  daily.   Nonalcoholic cirrhosis -- Pre TAVR CTs noted imaging features of cirrhosis and portal hypertension with esophageal varices as well as a possible hepatic mass. Follow up MRI 07/19/24 did not show any arterial  phase enhancing hepatic mass identified to suggest HCC or malignancy.  Thyroid  nodule: -- Pre TAVR CTs noted a right-sided thyroid  nodule measuring 1.0 cm. Thryoid US  recommended.  -- This will be dicussed in the outpatient setting.   _____________  Discharge Vitals Blood pressure (!) 103/49, pulse 62, temperature 97.8 F (36.6 C), temperature source Oral, resp. rate 19, height 5' 2 (1.575 m), weight 74.8 kg, SpO2 95%.  Filed Weights   08/13/24 1300 08/14/24 0639  Weight: 83.5 kg 74.8 kg     GEN: Well nourished, well developed in no acute distress NECK: No JVD CARDIAC: ***RRR, no murmurs, rubs, gallops RESPIRATORY:  Clear to auscultation without rales, wheezing or rhonchi  ABDOMEN: Soft, non-tender, non-distended EXTREMITIES:  No edema; No deformity.  Groin sites clear without hematoma or ecchymosis. ****   Disposition   Pt is being discharged home today in good condition.  Follow-up Plans & Appointments     Follow-up Information     Adelai Achey K, MD. Go on 08/22/2024.   Specialty: Cardiology Why: @ 11:20am, please arrive at least 20 minutes early. Contact information: [address] [place] [address] [phone]                  Discharge Medications   Allergies as of 08/14/2024       Reactions   Cardizem  [diltiazem ] Swelling   Lower leg edema    Jardiance  [empagliflozin ] Other (See Comments)     Med Rec must be completed prior to using this [facility]*           Outstanding Labs/Studies   ***  ______________________  Duration of Discharge Encounter: APP Time: *** minutes    Signed, [name], PA-C 08/14/2024, 3:15 PM [phone]

## 2024-08-14 NOTE — Progress Notes (Signed)
  Echocardiogram 2D Echocardiogram has been performed.  [name] [name] 08/14/2024, 12:47 PM

## 2024-08-14 NOTE — Anesthesia Procedure Notes (Signed)
 Procedure Name: MAC Date/Time: 08/14/2024 10:34 AM  Performed by: Arvell Edsel HERO, CRNAPre-anesthesia Checklist: Patient identified, Emergency Drugs available, Suction available, Patient being monitored and Timeout performed Patient Re-evaluated:Patient Re-evaluated prior to induction Oxygen Delivery Method: Simple face mask

## 2024-08-14 NOTE — Transfer of Care (Signed)
 Immediate Anesthesia Transfer of Care Note  Patient: [name] [name]  Procedure(s) Performed: Transcatheter Aortic Valve Replacement, Transfemoral ECHOCARDIOGRAM, TRANSTHORACIC  Patient Location: Cath Lab  Anesthesia Type:MAC  Level of Consciousness: drowsy and patient cooperative  Airway & Oxygen Therapy: Patient Spontanous Breathing and Patient connected to face mask oxygen  Post-op Assessment: Report given to RN, Post -op Vital signs reviewed and stable, Patient moving all extremities, and Patient moving all extremities X 4  Post vital signs: Reviewed and stable  Last Vitals:  Vitals Value Taken Time  BP 86/50 08/14/24 1155  Temp    Pulse 70 08/14/24 1155  Resp 12 08/14/24 1155  SpO2 100 08/14/24 1155    Last Pain:  Vitals:   08/14/24 0701  TempSrc:   PainSc: 0-No pain         Complications: There were no known notable events for this encounter.

## 2024-08-14 NOTE — Interval H&P Note (Signed)
 History and Physical Interval Note:  08/14/2024 9:56 AM  [name] [name]  has presented today for surgery, with the diagnosis of Severe Aortic Stenosis.  The various methods of treatment have been discussed with the patient and family. After consideration of risks, benefits and other options for treatment, the patient has consented to  Procedure(s): Transcatheter Aortic Valve Replacement, Transfemoral (N/A) ECHOCARDIOGRAM, TRANSTHORACIC (N/A) as a surgical intervention.  The patient's history has been reviewed, patient examined, no change in status, stable for surgery.  I have reviewed the patient's chart and labs.  Questions were answered to the patient's satisfaction.     [name] [name]

## 2024-08-14 NOTE — Plan of Care (Signed)

## 2024-08-14 NOTE — Discharge Instructions (Signed)

## 2024-08-14 NOTE — Progress Notes (Signed)
  HEART AND VASCULAR CENTER   MULTIDISCIPLINARY HEART VALVE TEAM  Patient doing well s/p TAVR. She is hemodynamically stable. Groin sites stable. ECG with sinus brady with IVCD but no high grade block. HRs in low 50s on tele. She did have transient CHB during case that self resolved. Transferred to 4E. Early ambulation after bedrest completed and hopeful discharge over the next 24-48 hours.   [name] PA-C  [facility]  Pager [phone]

## 2024-08-14 NOTE — Anesthesia Preprocedure Evaluation (Signed)
 Anesthesia Evaluation  Patient identified by MRN, date of birth, ID band Patient awake    Reviewed: Allergy & Precautions, NPO status , Patient's Chart, lab work & pertinent test results, reviewed documented beta blocker date and time   Airway Mallampati: III  TM Distance: >3 FB     Dental  (+) Missing,    Pulmonary COPD, former smoker   breath sounds clear to auscultation       Cardiovascular hypertension, + Orthopnea   Rhythm:Regular Rate:Normal + Systolic murmurs Normal right dominant circulation. 2.  Fick cardiac output of 8.0 L/min and Fick cardiac index of 4.4 L/min/m with the following hemodynamics:            Right atrial pressure mean of 10 mmHg            Right ventricular pressure 35/4 with end-diastolic pressure of 10 mmHg            Wedge pressure mean of 16 mmHg with V waves to 20 mmHg            PA pressure 28/19 with a mean of 25 mmHg 3.  Capacious iliofemoral vessels bilaterally.  If TAVR is to be pursued then the right femoral approach should be used as the femoral bifurcation is at the mid femoral head on the left side.  IMPRESSIONS     1. Left ventricular ejection fraction, by estimation, is 50%. The left  ventricle has low normal function. The left ventricle has no regional wall  motion abnormalities. There is mild asymmetric left ventricular  hypertrophy of the infero-lateral segment.  Left ventricular diastolic parameters are consistent with Grade I  diastolic dysfunction (impaired relaxation).   2. Right ventricular systolic function is mildly reduced. The right  ventricular size is normal. Tricuspid regurgitation signal is inadequate  for assessing PA pressure.   3. The mitral valve is degenerative. Trivial mitral valve regurgitation.  No evidence of mitral stenosis.   4. The tricuspid valve is degenerative, mild TR. Appears eccentric, may  be underestimated.   5. The aortic valve is abnormal. There  is severe calcifcation of the  aortic valve. Aortic valve regurgitation is not visualized. Moderate  aortic valve stenosis. Aortic valve area, by VTI measures 0.83 cm. Aortic  valve mean gradient measures 27.0 mmHg.  Aortic valve Vmax measures 3.43 m/s.   6. The inferior vena cava is normal in size with <50% respiratory  variability, suggesting right atrial pressure of 8 mmHg.     Neuro/Psych    GI/Hepatic ,GERD  ,,  Endo/Other  diabetes    Renal/GU Renal disease     Musculoskeletal  (+) Arthritis ,    Abdominal   Peds  Hematology Mild thrombocytopenia   Anesthesia Other Findings   Reproductive/Obstetrics                              Anesthesia Physical Anesthesia Plan  ASA: 4  Anesthesia Plan: MAC   Post-op Pain Management:    Induction: Intravenous  PONV Risk Score and Plan: 2 and Ondansetron  and Propofol  infusion  Airway Management Planned: Natural Airway and Simple Face Mask  Additional Equipment:   Intra-op Plan:   Post-operative Plan:   Informed Consent: I have reviewed the patients History and Physical, chart, labs and discussed the procedure including the risks, benefits and alternatives for the proposed anesthesia with the patient or authorized representative who has indicated his/her understanding and acceptance.  Dental advisory given  Plan Discussed with: CRNA  Anesthesia Plan Comments:          Anesthesia Quick Evaluation

## 2024-08-15 ENCOUNTER — Inpatient Hospital Stay (HOSPITAL_COMMUNITY)
Admission: RE | Admit: 2024-08-15 | Discharge: 2024-08-15 | Disposition: A | Discharge status: Home / Self Care | Source: Home / Self Care | Attending: Physician Assistant | Admitting: Physician Assistant

## 2024-08-15 ENCOUNTER — Encounter (HOSPITAL_COMMUNITY): Payer: Self-pay | Admitting: Internal Medicine

## 2024-08-15 ENCOUNTER — Inpatient Hospital Stay (HOSPITAL_COMMUNITY)

## 2024-08-15 DIAGNOSIS — Z952 Presence of prosthetic heart valve: Secondary | ICD-10-CM

## 2024-08-15 DIAGNOSIS — I442 Atrioventricular block, complete: Secondary | ICD-10-CM

## 2024-08-15 DIAGNOSIS — K766 Portal hypertension: Secondary | ICD-10-CM | POA: Diagnosis not present

## 2024-08-15 DIAGNOSIS — K7460 Unspecified cirrhosis of liver: Secondary | ICD-10-CM | POA: Diagnosis not present

## 2024-08-15 DIAGNOSIS — I272 Pulmonary hypertension, unspecified: Secondary | ICD-10-CM | POA: Diagnosis not present

## 2024-08-15 DIAGNOSIS — I350 Nonrheumatic aortic (valve) stenosis: Secondary | ICD-10-CM | POA: Diagnosis not present

## 2024-08-15 LAB — ECHOCARDIOGRAM COMPLETE
AR max vel: 2.23 cm2
AV Area VTI: 2.5 cm2
AV Area mean vel: 2.39 cm2
AV Mean grad: 9 mmHg
AV Peak grad: 17.9 mmHg
Ao pk vel: 2.12 m/s
Area-P 1/2: 5.79 cm2
Calc EF: 58.5 %
Height: 62 in
MV VTI: 3.77 cm2
S' Lateral: 2.5 cm
Single Plane A2C EF: 58 %
Single Plane A4C EF: 60.6 %
Weight: 2844.8 [oz_av]

## 2024-08-15 LAB — CBC
HCT: 36.1 % (ref 36.0–46.0)
Hemoglobin: 12.7 g/dL (ref 12.0–15.0)
MCH: 33.4 pg (ref 26.0–34.0)
MCHC: 35.2 g/dL (ref 30.0–36.0)
MCV: 95 fL (ref 80.0–100.0)
Platelets: 80 K/uL — ABNORMAL LOW (ref 150–400)
RBC: 3.8 MIL/uL — ABNORMAL LOW (ref 3.87–5.11)
RDW: 12.5 % (ref 11.5–15.5)
WBC: 6 K/uL (ref 4.0–10.5)
nRBC: 0 % (ref 0.0–0.2)

## 2024-08-15 LAB — BASIC METABOLIC PANEL WITH GFR
Anion gap: 5 (ref 5–15)
BUN: 18 mg/dL (ref 8–23)
CO2: 23 mmol/L (ref 22–32)
Calcium: 8.5 mg/dL — ABNORMAL LOW (ref 8.9–10.3)
Chloride: 108 mmol/L (ref 98–111)
Creatinine, Ser: 1.03 mg/dL — ABNORMAL HIGH (ref 0.44–1.00)
GFR, Estimated: 55 mL/min — ABNORMAL LOW (ref >60)
Glucose, Bld: 105 mg/dL — ABNORMAL HIGH (ref 70–99)
Potassium: 4.1 mmol/L (ref 3.5–5.1)
Sodium: 136 mmol/L (ref 135–145)

## 2024-08-15 LAB — GLUCOSE, CAPILLARY
Glucose-Capillary: 104 mg/dL — ABNORMAL HIGH (ref 70–99)
Glucose-Capillary: 98 mg/dL (ref 70–99)

## 2024-08-15 LAB — MAGNESIUM: Magnesium: 2 mg/dL (ref 1.7–2.4)

## 2024-08-15 NOTE — Progress Notes (Signed)
 Discussed with pt restrictions, exercise guidelines, and CRPII. Pt receptive. Will refer to [facility] CRPII.  [id number] Aliene Aris BS, ACSM-CEP 08/15/2024 10:43 AM

## 2024-08-15 NOTE — Anesthesia Postprocedure Evaluation (Signed)
 Anesthesia Post Note  Patient: [name] [name]  Procedure(s) Performed: Transcatheter Aortic Valve Replacement, Transfemoral ECHOCARDIOGRAM, TRANSTHORACIC     Patient location during evaluation: PACU Anesthesia Type: MAC Level of consciousness: awake and alert Pain management: pain level controlled Vital Signs Assessment: post-procedure vital signs reviewed and stable Respiratory status: spontaneous breathing, nonlabored ventilation, respiratory function stable and patient connected to nasal cannula oxygen Cardiovascular status: stable and blood pressure returned to baseline Postop Assessment: no apparent nausea or vomiting Anesthetic complications: no   There were no known notable events for this encounter.              Lynwood MARLA Cornea

## 2024-08-15 NOTE — Progress Notes (Signed)
°   08/15/24 1217  TOC Brief Assessment  Insurance and Status Reviewed  Patient has primary care physician Yes  Home environment has been reviewed home w/ familiy support (son)  Prior level of function: independent  Prior/Current Home Services No current home services  Social Drivers of Health Review SDOH reviewed no interventions necessary  Readmission risk has been reviewed Yes  Transition of care needs no transition of care needs at this time    Pt s/p TAVR, stable for transition home today, no HH or DME needs noted. Family will transport home.

## 2024-08-15 NOTE — Progress Notes (Signed)
 Mobility Specialist Progress Note;   08/15/24 0914  Mobility  Activity Ambulated independently  Level of Assistance Standby assist, set-up cues, supervision of patient - no hands on  Assistive Device None  Distance Ambulated (ft) 250 ft  Activity Response Tolerated well  Mobility Referral Yes  Mobility visit 1 Mobility  Mobility Specialist Start Time (ACUTE ONLY) 0914  Mobility Specialist Stop Time (ACUTE ONLY) [id number]  Mobility Specialist Time Calculation (min) (ACUTE ONLY) 9 min   Pt agreeable to mobility. Required no physical assistance during ambulation, SV for safety. VSS throughout and no c/o when asked. No bleeding at site when checked. Pt returned back to bed and left with all needs met, call bell in reach.   Lauraine Erm Mobility Specialist Please contact via SecureChat or [employer] [phone]

## 2024-08-15 NOTE — Progress Notes (Signed)
°  Echocardiogram 2D Echocardiogram has been performed.  Charmaine KANDICE Gaskins 08/15/2024, 9:49 AM

## 2024-08-16 ENCOUNTER — Telehealth: Payer: Self-pay

## 2024-08-16 NOTE — Telephone Encounter (Signed)
 Patient contacted regarding discharge from [facility] on 08/15/24  Patient understands to follow up with Dr. Wendel on 08/22/24 at 11:20AM at [address] Location. Patient understands discharge instructions? Yes Patient understands medications and regiment? Yes Patient understands to bring all medications to this visit? Yes  Patient expressed many times how grateful she was for everyone who took care of her in the hospital and states that she has had the most positive experience.

## 2024-08-16 NOTE — Progress Notes (Unsigned)
 Patient ID: [name] [name] MRN: [id number] DOB/AGE: 07-Mar-1941 83 y.o.  Primary Care Physician:Wharton, Charmaine, [place] Primary Cardiologist: Michele  CC:  Aortic valvular disease management     FOCUSED PROBLEM LIST:   Aortic stenosis TAVR 26 mm S3 December 2025 Due to transient heart block during procedure which resolved by completion procedure discharged with ZIO monitor T2DM Not on insulin , diet controlled Nonischemic cardiomyopathy No CAD coronary angiography 2022 EF 50 to 55% TTE February 2022 EF 50 to 55% TTE October 2022 EF 55 to 60% TTE May 2024 EF 50% TTE May 2025 Status post TAVR EF 55 to 60% December 2025 Breast cancer Status postlumpectomy 2005 XRT and CTX 2006 Nonalcoholic cirrhosis Hyperlipidemia Aortic atherosclerosis Chest CT 2022 Hypertension BMI 33/BSA 1.14 June 2024: Patient consents to use of AI scribe. The patient is a 83 year old female with the above listed medical problems referred by Dr. Michele for recommendation regarding her aortic valvular disease.  She was seen last she did report shortness of breath.  This is relatively unchanged versus previous.  She has no current symptoms such as shortness of breath, chest discomfort, or lightheadedness. She is able to perform daily activities without difficulty, including attending church three times a week and volunteering at a food bank. No significant slowing down in her activities has been noticed over the past few years.  Her past medical history includes breast cancer, for which she underwent a lumpectomy, radiation, and chemotherapy in 2005. She remains in remission, and her annual check-ups have shown no recurrence of cancer. She also has a history of cirrhosis, which was explained to her as not being related to alcohol use but possibly due to cholesterol deposits in the liver. She is not a drinker.  She was previously diagnosed with diabetes but no longer experiences symptoms. She manages her  condition through diet, primarily consuming vegetables, fruits, sweet tea, and water . Her glucose levels have been normal at recent check-ups.  In terms of her social history, she lives alone, drives, and maintains her household independently. She is actively involved in her church community, which she credits with providing her with a strong social network.  During the review of symptoms, she reports no lightheadedness when standing, no significant swelling in her legs except for neuropathy-related swelling in her foot, and no breathing difficulties when lying down. She does not sleep flat on her back.  She has upper and lower partial dentures.  Plan: Refer for TAVR.  December 2025:  Patient consents to use of AI scribe. Patient returns for follow-up following uncomplicated TAVR.  Her course was marked by transient complete heart block during the procedure which resolved.  Her EKG following the procedure demonstrated an IVCD.  She was discharged with a ZIO monitor which is still in place.  She feels fantastic and has not experienced any problems since being discharged from the hospital. Her breathing has improved significantly. No lightheadedness, blacking out spells, or dizzy spells.  She is currently wearing a monitor and has been managing her wound care with Neosporin and peroxide, keeping the area clean. The dressings have been removed.  She had an echocardiogram and an EKG performed during her recent hospitalization. Her blood pressure is reported to be good.  She denies any issues with her medications.        Past Medical History:  Diagnosis Date   Breast cancer [facility])    s/p lumpectomy 2005, XRT and CTX 2006   Chronic kidney disease (CKD), stage 3 (HCC)  Cirrhosis (HCC)    COPD (chronic obstructive pulmonary disease) (HCC)    DJD (degenerative joint disease)    GERD (gastroesophageal reflux disease)    Hyperlipidemia    Hypertension    Severe aortic stenosis     Thrombocytopenia    Vitamin D  deficiency     Past Surgical History:  Procedure Laterality Date   BREAST BIOPSY     BREAST LUMPECTOMY Left    CARDIAC CATHETERIZATION     CORONARY ANGIOPLASTY     INTRAOPERATIVE TRANSTHORACIC ECHOCARDIOGRAM N/A 08/14/2024   Procedure: ECHOCARDIOGRAM, TRANSTHORACIC;  Surgeon: Wendel Lurena POUR, MD;  Location: [facility];  Service: Cardiovascular;  Laterality: N/A;   LAPAROSCOPIC TOTAL HYSTERECTOMY     RIGHT HEART CATH AND CORONARY ANGIOGRAPHY N/A 06/25/2024   Procedure: RIGHT HEART CATH AND CORONARY ANGIOGRAPHY;  Surgeon: Wendel Lurena POUR, MD;  Location: [facility];  Service: Cardiovascular;  Laterality: N/A;   RIGHT/LEFT HEART CATH AND CORONARY ANGIOGRAPHY N/A 12/18/2020   Procedure: RIGHT/LEFT HEART CATH AND CORONARY ANGIOGRAPHY;  Surgeon: Elmira Newman PARAS, MD;  Location: [facility];  Service: Cardiovascular;  Laterality: N/A;    Family History  Problem Relation Age of Onset   Heart attack Mother    Stroke Mother    Cardiomyopathy Father     Social History   Socioeconomic History   Marital status: Widowed    Spouse name: Not on file   Number of children: 4   Years of education: Not on file   Highest education level: Not on file  Occupational History   Not on file  Tobacco Use   Smoking status: Former    Current packs/day: 0.00    Average packs/day: 0.5 packs/day for 42.6 years (21.3 ttl pk-yrs)    Types: Cigarettes    Start date: 02/12/1961    Quit date: 2005    Years since quitting: 20.9   Smokeless tobacco: Never  Vaping Use   Vaping status: Never Used  Substance and Sexual Activity   Alcohol use: Never   Drug use: Never   Sexual activity: Not on file  Other Topics Concern   Not on file  Social History Narrative   Not on file   Social Drivers of Health   Tobacco Use: Medium Risk (08/22/2024)   Patient History    Smoking Tobacco Use: Former    Smokeless Tobacco Use: Never    Passive Exposure: Not on  Actuary Strain: Not on file  Food Insecurity: No Food Insecurity (08/14/2024)   Epic    Worried About Programme Researcher, Broadcasting/film/video in the Last Year: Never true    Ran Out of Food in the Last Year: Never true  Transportation Needs: No Transportation Needs (08/14/2024)   Epic    Lack of Transportation (Medical): No    Lack of Transportation (Non-Medical): No  Physical Activity: Not on file  Stress: Not on file  Social Connections: Moderately Integrated (08/14/2024)   Social Connection and Isolation Panel    Frequency of Communication with Friends and Family: More than three times a week    Frequency of Social Gatherings with Friends and Family: More than three times a week    Attends Religious Services: More than 4 times per year    Active Member of [employer] or Organizations: Yes    Attends Banker Meetings: More than 4 times per year    Marital Status: Widowed  Intimate Partner Violence: Not At Risk (08/14/2024)  Epic    Fear of Current or Ex-Partner: No    Emotionally Abused: No    Physically Abused: No    Sexually Abused: No  Depression (PHQ2-9): Not on file  Alcohol Screen: Not on file  Housing: Unknown (08/14/2024)   Epic    Unable to Pay for Housing in the Last Year: No    Number of Times Moved in the Last Year: Not on file    Homeless in the Last Year: No  Utilities: Not At Risk (08/14/2024)   Epic    Threatened with loss of utilities: No  Health Literacy: Not on file     Prior to Admission medications   Medication Sig Start Date End Date Taking? Authorizing Provider  acetaminophen  (TYLENOL ) 500 MG tablet Take 500 mg by mouth every 6 (six) hours as needed.    [provider]  albuterol  (VENTOLIN  HFA) 108 (90 Base) MCG/ACT inhaler Inhale 2 puffs into the lungs every 4 (four) hours as needed for wheezing or shortness of breath. 03/17/21 06/13/24  Kara Dorn NOVAK, MD  Calcium  Polycarbophil (FIBER) 625 MG TABS 2 tablets as needed Orally Three times a  day    [provider]  Cholecalciferol (D-3-5) 125 MCG (5000 UT) capsule Take 10,000 Units by mouth daily.    [provider]  CINNAMON PO Take by mouth.    [provider]  dimenhyDRINATE (DRAMAMINE) 50 MG tablet Take 50 mg by mouth every 8 (eight) hours as needed for nausea.    [provider]  Docusate Sodium (DSS) 100 MG CAPS 1 capsule as needed Orally Once a day for 30 day(s)    [provider]  losartan  (COZAAR ) 25 MG tablet Take 1 tablet (25 mg total) by mouth daily. 07/05/22 06/13/24  Tolia, Sunit, DO  magnesium  oxide (MAG-OX) 400 MG tablet Take 400 mg by mouth daily.    [provider]  melatonin 3 MG TABS tablet Take 6 mg by mouth at bedtime.    [provider]  metoprolol  succinate (TOPROL  XL) 25 MG 24 hr tablet Take 1 tablet (25 mg total) by mouth daily. 12/06/23   Tolia, Sunit, DO  Misc Natural Products (APPLE CIDER VINEGAR DIET PO) Take by mouth.    [provider]  Multiple Vitamins-Minerals (ONE-A-DAY WOMENS 50+ ADVANTAGE PO) Take 1 tablet by mouth daily.    [provider]  rosuvastatin  (CRESTOR ) 10 MG tablet TAKE 1 TABLET BY MOUTH EVERYDAY AT BEDTIME 06/09/21   Tolia, Sunit, DO  solifenacin (VESICARE) 10 MG tablet Take 10 mg by mouth daily. 06/23/22   [provider]  spironolactone  (ALDACTONE ) 25 MG tablet Take 1 tablet (25 mg total) by mouth every morning. 03/20/24   Tolia, Sunit, DO  traZODone  (DESYREL ) 50 MG tablet Take 1 tablet by mouth at bedtime as needed.    [provider]    Allergies  Allergen Reactions   Cardizem  [Diltiazem ] Swelling    Lower leg edema    Jardiance  [Empagliflozin ] Other (See Comments)    REVIEW OF SYSTEMS:  General: no fevers/chills/night sweats Eyes: no blurry vision, diplopia, or amaurosis ENT: no sore throat or hearing loss Resp: no cough, wheezing, or hemoptysis CV: no edema or palpitations GI: no abdominal pain, nausea, vomiting, diarrhea, or  constipation GU: no dysuria, frequency, or hematuria Skin: no rash Neuro: no headache, numbness, tingling, or weakness of extremities Musculoskeletal: no joint pain or swelling Heme: no bleeding, DVT, or easy bruising Endo: no polydipsia or polyuria  BP  110/60   Pulse 69   Ht 5' 2 (1.575 m)   Wt 181 lb 6.4 oz (82.3 kg)   SpO2 94%   BMI 33.18 kg/m   PHYSICAL EXAM: GEN:  AO x 3 in no acute distress HEENT: normal Dentition: Partial uppers and lowers Neck: JVP normal. +2 carotid upstrokes without bruits. No thyromegaly. Lungs: equal expansion, clear bilaterally CV: Apex is discrete and nondisplaced, RRR with 3/6 Abd: soft, non-tender, non-distended; no bruit; positive bowel sounds Ext: no edema, ecchymoses, or cyanosis; bilateral groin sites clean dry and intact Vascular: 2+ femoral pulses, 2+ radial pulses       Skin: warm and dry without rash Neuro: CN II-XII grossly intact; motor and sensory grossly intact    DATA AND STUDIES:  EKG:   EKG Interpretation Date/Time:  Wednesday August 22 2024 11:24:09 EST Ventricular Rate:  69 PR Interval:  180 QRS Duration:  132 QT Interval:  408 QTC Calculation: 437 R Axis:   -39  Text Interpretation: Normal sinus rhythm Left axis deviation Non-specific intra-ventricular conduction block Minimal voltage criteria for LVH, may be normal variant ( Cornell product ) Cannot rule out Septal infarct , age undetermined When compared with ECG of 15-Aug-2024 05:39, No significant change was found Confirmed by Wendel Haws ([id number]) on 08/22/2024 11:27:46 AM        CARDIAC STUDIES: Refer to CV Procedures and Imaging Tabs  08/10/2024: ALT 18 08/15/2024: BUN 18; Creatinine, Ser 1.03; Hemoglobin 12.7; Magnesium  2.0; Platelets 80; Potassium 4.1; Sodium 136       ASSESSMENT AND PLAN:   1. S/P TAVR (transcatheter aortic valve replacement)   2. CHB (complete heart block) (HCC)   3. Chronic heart failure with preserved ejection fraction  (HFpEF) (HCC)   4. Malignant neoplasm of female breast, unspecified estrogen receptor status, unspecified laterality, unspecified site of breast (HCC)   5. Cirrhosis, nonalcoholic (HCC)   6. Hyperlipidemia LDL goal <70   7. Aortic atherosclerosis   8. Primary hypertension      Status post TAVR: Ejection fraction now normalized.  Continue aspirin  81 mg.  No incidentals noted on preprocedural TAVR CTAs.  Needs antibiotic prophylaxis for dental work.  Okay to proceed to cardiac rehabilitation.  Appointment in 3 weeks for required 30-day registry follow-up.  Will arrange follow-up with Dr. Michele. Complete heart block: Transient.  Follow-up ZIO monitor.. Chronic systolic heart failure: Resolved with TAVR.  Continue Toprol  25 mg, losartan  25 mg, spironolactone  25 mg. History of breast cancer: In remission. Cirrhosis: LFTs seem to be normal; no PT or INR assessed recently. Hyperlipidemia: Continue rosuvastatin  10 mg. Aortic atherosclerosis: Continue rosuvastatin  10 mg; consider aspirin  81 mg. Hypertension: Continue losartan  25 mg, Toprol  25 mg, spironolactone  25 mg.  Blood pressure is well-controlled today.   I have personally reviewed the patients imaging data as summarized above.  I have reviewed the natural history of aortic stenosis with the patient and family members who are present today. We have discussed the limitations of medical therapy and the poor prognosis associated with symptomatic aortic stenosis. We have also reviewed potential treatment options, including palliative medical therapy, conventional surgical aortic valve replacement, and transcatheter aortic valve replacement. We discussed treatment options in the context of this patient's specific comorbid medical conditions.   All of the patient's questions were answered today. Will make further recommendations based on the results of studies outlined above.   I spent 33 minutes reviewing all clinical data during and prior to this  visit including all relevant  imaging studies, laboratories, clinical information from other health systems and prior notes from both Cardiology and other specialties, interviewing the patient, conducting a complete physical examination, and coordinating care in order to formulate a comprehensive and personalized evaluation and treatment plan.   Zury Fazzino K Chayil Gantt, MD  08/22/2024 11:39 AM    [facility] [address] [place], [place], [place]  [address] Phone: [phone]; Fax: [phone]

## 2024-08-16 NOTE — Progress Notes (Signed)
 ZIO AT applied at hospital. Dr. Albert Huff to read.

## 2024-08-17 ENCOUNTER — Telehealth (HOSPITAL_COMMUNITY): Payer: Self-pay

## 2024-08-17 NOTE — Telephone Encounter (Signed)
Called patient to see if she is interested in the Cardiac Rehab Program. Patient expressed interest. Explained scheduling process and went over insurance process, patient verbalized understanding. Will contact patient for scheduling once f/u has been completed. °

## 2024-08-20 ENCOUNTER — Telehealth: Payer: Self-pay

## 2024-08-20 NOTE — Progress Notes (Signed)
..  Complex Care Management   08/20/2024  Name: [name] [name]  MRN: [id number]  DOB: [birth year]  Inbound call received from [name] after receiving an Interactive Voice Response (IVR) call after a recent hospitalization. Information was provided about Care Management services.  Follow up plan: No further follow up required    [name], [name] [facility]  [facility] Care Management Assistant [phone]

## 2024-08-22 ENCOUNTER — Encounter: Payer: Self-pay | Admitting: Internal Medicine

## 2024-08-22 ENCOUNTER — Ambulatory Visit: Attending: Internal Medicine | Admitting: Internal Medicine

## 2024-08-22 ENCOUNTER — Telehealth: Payer: Self-pay | Admitting: Internal Medicine

## 2024-08-22 VITALS — BP 110/60 | HR 69 | Ht 62.0 in | Wt 181.4 lb

## 2024-08-22 DIAGNOSIS — E785 Hyperlipidemia, unspecified: Secondary | ICD-10-CM | POA: Diagnosis not present

## 2024-08-22 DIAGNOSIS — I700 Atherosclerosis of aorta: Secondary | ICD-10-CM | POA: Diagnosis not present

## 2024-08-22 DIAGNOSIS — I10 Essential (primary) hypertension: Secondary | ICD-10-CM

## 2024-08-22 DIAGNOSIS — I5032 Chronic diastolic (congestive) heart failure: Secondary | ICD-10-CM | POA: Diagnosis not present

## 2024-08-22 DIAGNOSIS — K7460 Unspecified cirrhosis of liver: Secondary | ICD-10-CM

## 2024-08-22 DIAGNOSIS — I442 Atrioventricular block, complete: Secondary | ICD-10-CM | POA: Diagnosis not present

## 2024-08-22 DIAGNOSIS — C50919 Malignant neoplasm of unspecified site of unspecified female breast: Secondary | ICD-10-CM

## 2024-08-22 DIAGNOSIS — Z952 Presence of prosthetic heart valve: Secondary | ICD-10-CM | POA: Diagnosis not present

## 2024-08-22 NOTE — Patient Instructions (Signed)
 Medication Instructions:  No medication changes were made at this visit. Continue current regimen.   *If you need a refill on your cardiac medications before your next appointment, please call your pharmacy*  Lab Work: None ordered today. If you have labs (blood work) drawn today and your tests are completely normal, you will receive your results only by: MyChart Message (if you have MyChart) OR A paper copy in the mail If you have any lab test that is abnormal or we need to change your treatment, we will call you to review the results.  Testing/Procedures: None ordered today.  Follow-Up: At [facility], you and your health needs are our priority.  As part of our continuing mission to provide you with exceptional heart care, our providers are all part of one team.  This team includes your primary Cardiologist (physician) and Advanced Practice Providers or APPs (Physician Assistants and Nurse Practitioners) who all work together to provide you with the care you need, when you need it.  Your next appointment:   6 month(s)  Provider:   Olinda Bertrand, DO

## 2024-08-22 NOTE — Telephone Encounter (Signed)
 Pt would like to know if she's able to drive again.

## 2024-08-24 NOTE — Telephone Encounter (Signed)
 Spoke with pt and let her know that she should wait to drive until we get her heart monitor results back to review. Pt verbalized understanding and had no further questions or concerns.

## 2024-09-11 ENCOUNTER — Telehealth: Payer: Self-pay | Admitting: Physician Assistant

## 2024-09-11 NOTE — Telephone Encounter (Signed)
 Pt states that she has procedure on 12/9 and would like to know when she is able to start back driving. Please advise

## 2024-09-11 NOTE — Addendum Note (Signed)
 Encounter addended by: Malvina Pina A on: 09/11/2024 8:06 AM  Actions taken: Imaging Exam ended

## 2024-09-20 DIAGNOSIS — I442 Atrioventricular block, complete: Secondary | ICD-10-CM | POA: Diagnosis not present

## 2024-09-20 DIAGNOSIS — Z952 Presence of prosthetic heart valve: Secondary | ICD-10-CM

## 2024-09-21 ENCOUNTER — Ambulatory Visit: Payer: Self-pay | Admitting: Internal Medicine

## 2024-09-24 ENCOUNTER — Ambulatory Visit: Admitting: Physician Assistant

## 2024-09-24 ENCOUNTER — Ambulatory Visit

## 2024-09-24 NOTE — Progress Notes (Unsigned)
 " [facility]   MULTIDISCIPLINARY HEART VALVE CLINIC                                     Cardiology Office Note:    Date:  09/24/2024   ID:  [name] [name], DOB 1940/09/13, MRN [id number]  PCP:  Katina Pfeiffer, PA-C  [facility] Cardiologist:  [name], DO  [facility] Structural heart: [name], MD [facility] Electrophysiologist:  None   Referring MD: Katina Pfeiffer, PA-C   1 month s/p TAVR   History of Present Illness:    [name] [name] is a 84 y.o. female with a hx of ***  Past Medical History:  Diagnosis Date   Breast cancer (HCC)    s/p lumpectomy 2005, XRT and CTX 2006   Chronic kidney disease (CKD), stage 3 (HCC)    Cirrhosis (HCC)    COPD (chronic obstructive pulmonary disease) (HCC)    DJD (degenerative joint disease)    GERD (gastroesophageal reflux disease)    Hyperlipidemia    Hypertension    Severe aortic stenosis    Thrombocytopenia    Vitamin D  deficiency      Current Medications: Active Medications[1]    ROS:   Please see the history of present illness.    All other systems reviewed and are negative.  EKGs       Risk Assessment/Calculations:   {Does this patient have ATRIAL FIBRILLATION?:[id number]}   {This patient may be at risk for Amyloid. She has one or more dx on the prob list or PMH from the following list -  Abnormal EKG, HFpEF/Diastolic CHF, Aortic Stenosis, LVH, Bilateral Carpal Tunnel Syndrome, Biceps Tendon Rupture, Spinal Stenosis, Pericardial Effusion, Left Atrial Enlargement, Conduction System Disorder. See list below or review PMH.  Diagnoses From Problem List           Noted     Severe aortic stenosis Unknown    Click HERE to open Cardiac Amyloid Screening SmartSet to order screening OR Click HERE to defer testing for 1 year or permanently :1}    Physical Exam:    VS:  There were no vitals taken for this visit.    Wt Readings from Last 3 Encounters:  08/22/24 181 lb 6.4 oz  (82.3 kg)  08/15/24 177 lb 12.8 oz (80.6 kg)  07/18/24 184 lb (83.5 kg)     GEN: Well nourished, well developed in no acute distress NECK: No JVD CARDIAC: ***RRR, no murmurs, rubs, gallops RESPIRATORY:  Clear to auscultation without rales, wheezing or rhonchi  ABDOMEN: Soft, non-tender, non-distended EXTREMITIES:  No edema; No deformity.  Groin sites clear without hematoma or ecchymosis. ****  ASSESSMENT:    No diagnosis found.  PLAN:    In order of problems listed above:  Severe AS s/p TAVR:  -- Pt doing *** s/p TAVR.  -- ECG with no HAVB.  -- Groin sites healing well.  -- SBE***.  -- Continue Aspirin  81mg  daily.*** -- Cleared to resume all activities without restriction. -- I will see back for 1 month echo and OV.  Severe AS s/p TAVR:  -- Echo today shows EF ***, normally functioning TAVR with a mean gradient of *** mm hg and *** PVL.  -- NYHA class *** symptoms.  -- Continue ***.  -- SBE*** -- I will see back for 1 month/year office visit with echo.      {  The patient has an active order for outpatient cardiac rehabilitation.   Please indicate if the patient is ready to start. Do NOT delete this.  It will auto delete.  Refresh note, then sign.              Click here to document readiness and see contraindications.  :1}  Cardiac Rehabilitation Eligibility Assessment      {Are you ordering a CV Procedure (e.g. stress test, cath, DCCV, TEE, etc)?   Press F2        :[id number]}     Medication Adjustments/Labs and Tests Ordered: Current medicines are reviewed at length with the patient today.  Concerns regarding medicines are outlined above.  No orders of the defined types were placed in this encounter.  No orders of the defined types were placed in this encounter.   There are no Patient Instructions on file for this visit.   Signed, [name], PA-C  09/24/2024 10:40 AM    [facility]    [1]  No outpatient medications  have been marked as taking for the 09/24/24 encounter (Appointment) with Hummer Lamarr SAUNDERS, PA-C.   "

## 2024-10-11 ENCOUNTER — Telehealth (HOSPITAL_COMMUNITY): Payer: Self-pay

## 2024-10-11 NOTE — Telephone Encounter (Signed)
 Pt insurance is active and benefits verified through [facility]. Co-pay $20, DED $0/$0 met, out of pocket $7,500/$0 met, co-insurance 0%. No pre-authorization required. 10/11/2024 @ 1:39pm, REF# [id number].  TCR/ICR? ICR Visit(date of service)limitation? No Can multiple codes be used on the same date of service/visit?(IF ITS A LIMIT) N/A  Is this a lifetime maximum or an annual maximum? Annual Has the member used any of these services to date? No Is there a time limit (weeks/months) on start of program and/or program completion? No

## 2024-10-11 NOTE — Telephone Encounter (Signed)
 Called patient to see if she was interested in participating in the Cardiac Rehab Program. Patient will come in for orientation on 2/26 and will attend the 12:30 exercise class.  Pensions consultant.

## 2024-10-26 ENCOUNTER — Ambulatory Visit: Admitting: Cardiology

## 2024-10-26 ENCOUNTER — Ambulatory Visit (HOSPITAL_COMMUNITY)

## 2024-11-01 ENCOUNTER — Encounter (HOSPITAL_COMMUNITY)

## 2024-11-05 ENCOUNTER — Encounter (HOSPITAL_COMMUNITY)

## 2024-11-07 ENCOUNTER — Encounter (HOSPITAL_COMMUNITY)

## 2024-11-09 ENCOUNTER — Encounter (HOSPITAL_COMMUNITY)

## 2024-11-12 ENCOUNTER — Encounter (HOSPITAL_COMMUNITY)

## 2024-11-14 ENCOUNTER — Encounter (HOSPITAL_COMMUNITY)

## 2024-11-16 ENCOUNTER — Encounter (HOSPITAL_COMMUNITY)

## 2024-11-19 ENCOUNTER — Encounter (HOSPITAL_COMMUNITY)

## 2024-11-21 ENCOUNTER — Encounter (HOSPITAL_COMMUNITY)

## 2024-11-23 ENCOUNTER — Encounter (HOSPITAL_COMMUNITY)

## 2024-11-26 ENCOUNTER — Encounter (HOSPITAL_COMMUNITY)

## 2024-11-28 ENCOUNTER — Encounter (HOSPITAL_COMMUNITY)

## 2024-11-30 ENCOUNTER — Encounter (HOSPITAL_COMMUNITY)

## 2024-12-03 ENCOUNTER — Encounter (HOSPITAL_COMMUNITY)

## 2024-12-05 ENCOUNTER — Encounter (HOSPITAL_COMMUNITY)

## 2024-12-07 ENCOUNTER — Encounter (HOSPITAL_COMMUNITY)

## 2024-12-10 ENCOUNTER — Encounter (HOSPITAL_COMMUNITY)

## 2024-12-12 ENCOUNTER — Encounter (HOSPITAL_COMMUNITY)

## 2024-12-14 ENCOUNTER — Encounter (HOSPITAL_COMMUNITY)

## 2024-12-17 ENCOUNTER — Encounter (HOSPITAL_COMMUNITY)

## 2024-12-19 ENCOUNTER — Encounter (HOSPITAL_COMMUNITY)

## 2024-12-21 ENCOUNTER — Encounter (HOSPITAL_COMMUNITY)

## 2024-12-24 ENCOUNTER — Encounter (HOSPITAL_COMMUNITY)

## 2024-12-26 ENCOUNTER — Encounter (HOSPITAL_COMMUNITY)

## 2024-12-28 ENCOUNTER — Encounter (HOSPITAL_COMMUNITY)

## 2024-12-31 ENCOUNTER — Encounter (HOSPITAL_COMMUNITY)

## 2025-01-02 ENCOUNTER — Encounter (HOSPITAL_COMMUNITY)

## 2025-01-04 ENCOUNTER — Encounter (HOSPITAL_COMMUNITY)

## 2025-01-07 ENCOUNTER — Encounter (HOSPITAL_COMMUNITY)

## 2025-01-09 ENCOUNTER — Encounter (HOSPITAL_COMMUNITY)

## 2025-01-11 ENCOUNTER — Encounter (HOSPITAL_COMMUNITY)

## 2025-01-14 ENCOUNTER — Encounter (HOSPITAL_COMMUNITY)

## 2025-01-16 ENCOUNTER — Encounter (HOSPITAL_COMMUNITY)

## 2025-01-18 ENCOUNTER — Encounter (HOSPITAL_COMMUNITY)

## 2025-01-21 ENCOUNTER — Encounter (HOSPITAL_COMMUNITY)

## 2025-01-23 ENCOUNTER — Encounter (HOSPITAL_COMMUNITY)

## 2025-08-08 ENCOUNTER — Ambulatory Visit (HOSPITAL_COMMUNITY)

## 2025-08-08 ENCOUNTER — Ambulatory Visit: Admitting: Physician Assistant
